# Patient Record
Sex: Female | Born: 2000 | Race: White | Hispanic: Refuse to answer | Marital: Single
Health system: Southern US, Academic
[De-identification: ages and names within clinical notes are randomized; demographics above are authoritative.]

## PROBLEM LIST (undated history)

## (undated) ENCOUNTER — Telehealth
Attending: Student in an Organized Health Care Education/Training Program | Primary: Student in an Organized Health Care Education/Training Program

## (undated) ENCOUNTER — Ambulatory Visit: Payer: PRIVATE HEALTH INSURANCE

## (undated) ENCOUNTER — Inpatient Hospital Stay

## (undated) ENCOUNTER — Encounter

## (undated) ENCOUNTER — Encounter
Attending: Student in an Organized Health Care Education/Training Program | Primary: Student in an Organized Health Care Education/Training Program

## (undated) ENCOUNTER — Ambulatory Visit: Payer: Medicaid (Managed Care) | Attending: Registered" | Primary: Registered"

## (undated) ENCOUNTER — Encounter: Attending: Physician Assistant | Primary: Physician Assistant

## (undated) ENCOUNTER — Ambulatory Visit

## (undated) ENCOUNTER — Telehealth

## (undated) ENCOUNTER — Ambulatory Visit: Payer: MEDICAID

## (undated) ENCOUNTER — Ambulatory Visit
Payer: Medicaid (Managed Care) | Attending: Addiction (Substance Use Disorder) | Primary: Addiction (Substance Use Disorder)

## (undated) ENCOUNTER — Encounter
Payer: Medicaid (Managed Care) | Attending: Addiction (Substance Use Disorder) | Primary: Addiction (Substance Use Disorder)

## (undated) ENCOUNTER — Other Ambulatory Visit

## (undated) ENCOUNTER — Ambulatory Visit: Payer: MEDICAID | Attending: Neurology | Primary: Neurology

## (undated) ENCOUNTER — Ambulatory Visit: Payer: PRIVATE HEALTH INSURANCE | Attending: Physician Assistant | Primary: Physician Assistant

## (undated) ENCOUNTER — Encounter: Attending: Neurology | Primary: Neurology

## (undated) ENCOUNTER — Encounter: Attending: Addiction (Substance Use Disorder) | Primary: Addiction (Substance Use Disorder)

## (undated) ENCOUNTER — Telehealth: Attending: Urology | Primary: Urology

## (undated) ENCOUNTER — Encounter: Attending: Diagnostic Radiology | Primary: Diagnostic Radiology

## (undated) ENCOUNTER — Encounter: Attending: Registered" | Primary: Registered"

## (undated) ENCOUNTER — Ambulatory Visit
Payer: PRIVATE HEALTH INSURANCE | Attending: Addiction (Substance Use Disorder) | Primary: Addiction (Substance Use Disorder)

## (undated) ENCOUNTER — Ambulatory Visit: Attending: Addiction (Substance Use Disorder) | Primary: Addiction (Substance Use Disorder)

## (undated) ENCOUNTER — Ambulatory Visit
Payer: PRIVATE HEALTH INSURANCE | Attending: Student in an Organized Health Care Education/Training Program | Primary: Student in an Organized Health Care Education/Training Program

## (undated) ENCOUNTER — Ambulatory Visit: Payer: Medicaid (Managed Care)

## (undated) ENCOUNTER — Encounter: Payer: Medicaid (Managed Care) | Attending: Registered" | Primary: Registered"

## (undated) ENCOUNTER — Ambulatory Visit: Attending: Pharmacist | Primary: Pharmacist

## (undated) ENCOUNTER — Ambulatory Visit
Payer: MEDICAID | Attending: Rehabilitative and Restorative Service Providers" | Primary: Rehabilitative and Restorative Service Providers"

## (undated) ENCOUNTER — Other Ambulatory Visit: Attending: Addiction (Substance Use Disorder) | Primary: Addiction (Substance Use Disorder)

## (undated) ENCOUNTER — Telehealth: Attending: Neurology | Primary: Neurology

## (undated) ENCOUNTER — Ambulatory Visit
Payer: Medicaid (Managed Care) | Attending: Student in an Organized Health Care Education/Training Program | Primary: Student in an Organized Health Care Education/Training Program

## (undated) ENCOUNTER — Encounter: Attending: Family Medicine | Primary: Family Medicine

## (undated) ENCOUNTER — Ambulatory Visit: Attending: Nurse Practitioner | Primary: Nurse Practitioner

## (undated) ENCOUNTER — Ambulatory Visit: Payer: PRIVATE HEALTH INSURANCE | Attending: Medical | Primary: Medical

---
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
## 2022-01-08 ENCOUNTER — Other Ambulatory Visit: Payer: Self-pay | Admitting: Otolaryngology

## 2022-01-11 ENCOUNTER — Other Ambulatory Visit: Payer: Self-pay | Admitting: Otolaryngology

## 2022-01-11 DIAGNOSIS — H8120 Vestibular neuronitis, unspecified ear: Secondary | ICD-10-CM

## 2022-02-01 ENCOUNTER — Ambulatory Visit
Admission: RE | Admit: 2022-02-01 | Discharge: 2022-02-01 | Disposition: A | Discharge status: Home / Self Care | Payer: BC Managed Care – PPO | Source: Ambulatory Visit | Attending: Otolaryngology | Admitting: Otolaryngology

## 2022-02-01 DIAGNOSIS — H8120 Vestibular neuronitis, unspecified ear: Secondary | ICD-10-CM

## 2022-02-01 MED ORDER — GADOBUTROL 1 MMOL/ML IV SOLN
10.0000 mL | Freq: Once | INTRAVENOUS | Status: AC | PRN
  Administered 2022-02-01: 10 mL via INTRAVENOUS

## 2022-02-17 ENCOUNTER — Other Ambulatory Visit: Payer: Self-pay | Admitting: Nurse Practitioner

## 2022-02-17 DIAGNOSIS — R270 Ataxia, unspecified: Secondary | ICD-10-CM

## 2022-02-17 DIAGNOSIS — R42 Dizziness and giddiness: Secondary | ICD-10-CM

## 2022-02-17 DIAGNOSIS — G379 Demyelinating disease of central nervous system, unspecified: Secondary | ICD-10-CM

## 2022-03-05 ENCOUNTER — Inpatient Hospital Stay: Admission: RE | Admit: 2022-03-05 | Payer: BC Managed Care – PPO | Source: Ambulatory Visit

## 2022-03-16 ENCOUNTER — Other Ambulatory Visit: Payer: BC Managed Care – PPO

## 2022-04-21 ENCOUNTER — Other Ambulatory Visit: Payer: Self-pay | Admitting: Nurse Practitioner

## 2022-04-21 DIAGNOSIS — G35 Multiple sclerosis: Secondary | ICD-10-CM

## 2022-04-30 DIAGNOSIS — G35 Multiple sclerosis: Principal | ICD-10-CM

## 2022-06-09 ENCOUNTER — Telehealth: Admit: 2022-06-09 | Discharge: 2022-06-10 | Payer: PRIVATE HEALTH INSURANCE | Attending: Neurology | Primary: Neurology

## 2022-06-09 DIAGNOSIS — G35 Multiple sclerosis: Principal | ICD-10-CM

## 2022-06-09 DIAGNOSIS — G379 Demyelinating disease of central nervous system, unspecified: Principal | ICD-10-CM

## 2022-06-14 ENCOUNTER — Ambulatory Visit: Admit: 2022-06-14 | Discharge: 2022-06-15 | Payer: PRIVATE HEALTH INSURANCE

## 2022-06-14 DIAGNOSIS — G35 Multiple sclerosis: Principal | ICD-10-CM

## 2022-06-14 DIAGNOSIS — G379 Demyelinating disease of central nervous system, unspecified: Principal | ICD-10-CM

## 2022-07-21 ENCOUNTER — Telehealth
Admit: 2022-07-21 | Discharge: 2022-07-22 | Payer: MEDICAID | Attending: Physician Assistant | Primary: Physician Assistant

## 2022-07-21 MED ORDER — SERTRALINE 50 MG TABLET
ORAL_TABLET | Freq: Every day | ORAL | 2 refills | 30 days | Status: CP
Start: 2022-07-21 — End: 2023-07-21

## 2022-07-31 ENCOUNTER — Emergency Department
Admission: EM | Admit: 2022-07-31 | Discharge: 2022-08-01 | Disposition: A | Discharge status: Home / Self Care | Payer: Medicaid Other | Attending: Emergency Medicine | Admitting: Emergency Medicine

## 2022-07-31 DIAGNOSIS — B372 Candidiasis of skin and nail: Secondary | ICD-10-CM | POA: Insufficient documentation

## 2022-07-31 DIAGNOSIS — R42 Dizziness and giddiness: Secondary | ICD-10-CM | POA: Insufficient documentation

## 2022-07-31 DIAGNOSIS — R55 Syncope and collapse: Secondary | ICD-10-CM | POA: Insufficient documentation

## 2022-07-31 DIAGNOSIS — E876 Hypokalemia: Secondary | ICD-10-CM | POA: Diagnosis not present

## 2022-07-31 DIAGNOSIS — Y900 Blood alcohol level of less than 20 mg/100 ml: Secondary | ICD-10-CM | POA: Diagnosis not present

## 2022-07-31 DIAGNOSIS — N390 Urinary tract infection, site not specified: Secondary | ICD-10-CM | POA: Insufficient documentation

## 2022-07-31 MED ORDER — SODIUM CHLORIDE 0.9 % IV BOLUS
1000.0000 mL | Freq: Once | INTRAVENOUS | Status: AC
  Administered 2022-08-01: 1000 mL via INTRAVENOUS

## 2022-07-31 MED ORDER — DIAZEPAM 5 MG/ML IJ SOLN
2.5000 mg | Freq: Once | INTRAMUSCULAR | Status: DC
  Filled 2022-07-31: qty 2

## 2022-07-31 MED ORDER — ONDANSETRON HCL 4 MG/2ML IJ SOLN
4.0000 mg | Freq: Once | INTRAMUSCULAR | Status: DC
  Filled 2022-07-31: qty 2

## 2022-07-31 NOTE — ED Triage Notes (Signed)
Pt reports near syncope x3 days.  Pt reports dizziness while sitting, standing, moving, x4 years.  EMS reports HR in 170's, ST upon arrival.

## 2022-07-31 NOTE — ED Provider Notes (Incomplete)
[facility] Provider Note    Event Date/Time   First MD Initiated Contact with Patient 07/31/22 2353     (approximate)   History   Near Syncope   HPI  Amber Wilkerson is a 21 y.o. female brought to the ED via EMS from home with a chief complaint of dizziness.  Patient with a history of multiple sclerosis who states she has been dizzy for several months.  Called EMS tonight in reference to near syncope.  Reports sensation of motion while sitting, standing and moving.  Tonight felt like she was going to pass out.  EMS reports sinus tachycardia in the 170s.  Denies headache, vision changes, neck pain, chest pain, shortness of breath, abdominal pain, nausea or vomiting.  Patient feels better with her eyes closed.  Has seen her neurologist multiple times for the same complaint but "she is a bitch".     Past Medical History  Multiple sclerosis   Active Problem List  There are no problems to display for this patient.    Past Surgical History  See chart   Home Medications   Prior to Admission medications   Not on File     Allergies  Blueberry flavor [flavoring agent]   Family History  No family history on file.   Physical Exam  Triage Vital Signs: ED Triage Vitals  Enc Vitals Group     BP      Pulse      Resp      Temp      Temp src      SpO2      Weight      Height      Head Circumference      Peak Flow      Pain Score      Pain Loc      Pain Edu?      Excl. in [place]?     Updated Vital Signs: BP (!) 150/91   Pulse (!) 124   Temp 98.9 F (37.2 C) (Oral)   Resp 18   Ht 5\' 3"  (1.6 m)   Wt 129.3 kg   SpO2 96%   BMI 50.49 kg/m    General: Awake, mild distress.  CV:  Tachycardic.  Good peripheral perfusion.  Resp:  Normal effort.  CTA B. Abd:  Nontender.  No distention.  Other:  No thyromegaly.   ED Results / Procedures / Treatments  Labs (all labs ordered are listed, but only abnormal results are displayed) Labs  Reviewed  CBC WITH DIFFERENTIAL/PLATELET  COMPREHENSIVE METABOLIC PANEL  URINALYSIS, ROUTINE W REFLEX MICROSCOPIC  URINE DRUG SCREEN, QUALITATIVE ([facility] ONLY)  ETHANOL  POC URINE PREG, ED  TROPONIN I (HIGH SENSITIVITY)     EKG  ***   RADIOLOGY *** {You MUST document your own interpretation of imaging, as well as the fact that you reviewed the radiologist's report!:1}  Official radiology report(s): No results found.   PROCEDURES:  Critical Care performed: {CriticalCareYesNo:19197::"Yes, see critical care procedure note(s)","No"}  Procedures   MEDICATIONS ORDERED IN ED: Medications  sodium chloride 0.9 % bolus 1,000 mL (has no administration in time range)  ondansetron (ZOFRAN) injection 4 mg (has no administration in time range)  diazepam (VALIUM) injection 2.5 mg (has no administration in time range)     IMPRESSION / MDM / ASSESSMENT AND PLAN / ED COURSE  I reviewed the triage vital signs and the nursing notes.  Differential diagnosis includes, but is not limited to, ***  Patient's presentation is most consistent with {EM COPA:27473}  {If the patient is on the monitor, remove the brackets and asterisks on the sentence below and remember to document it as a Procedure as well. Otherwise delete the sentence below:1} {**The patient is on the cardiac monitor to evaluate for evidence of arrhythmia and/or significant heart rate changes.**}  {Remember to include, when applicable, any/all of the following data: independent review of imaging independent review of labs (comment specifically on pertinent positives and negatives) review of specific prior hospitalizations, PCP/specialist notes, etc. discuss meds given and prescribed document any discussion with consultants (including hospitalists) any clinical decision tools you used and why (PECARN, NEXUS, etc.) did you consider admitting the patient? document social determinants of health  affecting patient's care (homelessness, inability to follow up in a timely fashion, etc) document any pre-existing conditions increasing risk on current visit (e.g. diabetes and HTN increasing danger of high-risk chest pain/ACS) describes what meds you gave (especially parenteral) and why any other interventions?:1}      FINAL CLINICAL IMPRESSION(S) / ED DIAGNOSES   Final diagnoses:  Near syncope  Dizziness     Rx / DC Orders   ED Discharge Orders     None        Note:  This document was prepared using Dragon voice recognition software and may include unintentional dictation errors.

## 2022-08-01 ENCOUNTER — Emergency Department: Payer: Medicaid Other

## 2022-08-01 LAB — URINALYSIS, ROUTINE W REFLEX MICROSCOPIC
Bilirubin Urine: NEGATIVE
Glucose, UA: NEGATIVE mg/dL
Ketones, ur: NEGATIVE mg/dL
Nitrite: NEGATIVE
Protein, ur: 30 mg/dL — AB
Specific Gravity, Urine: 1.021 (ref 1.005–1.030)
pH: 7 (ref 5.0–8.0)

## 2022-08-01 LAB — CBC WITH DIFFERENTIAL/PLATELET
Abs Immature Granulocytes: 0.02 10*3/uL (ref 0.00–0.07)
Basophils Absolute: 0 10*3/uL (ref 0.0–0.1)
Basophils Relative: 0 %
Eosinophils Absolute: 0.1 10*3/uL (ref 0.0–0.5)
Eosinophils Relative: 1 %
HCT: 46.1 % — ABNORMAL HIGH (ref 36.0–46.0)
Hemoglobin: 15.6 g/dL — ABNORMAL HIGH (ref 12.0–15.0)
Immature Granulocytes: 0 %
Lymphocytes Relative: 22 %
Lymphs Abs: 2 10*3/uL (ref 0.7–4.0)
MCH: 28.9 pg (ref 26.0–34.0)
MCHC: 33.8 g/dL (ref 30.0–36.0)
MCV: 85.4 fL (ref 80.0–100.0)
Monocytes Absolute: 1 10*3/uL (ref 0.1–1.0)
Monocytes Relative: 11 %
Neutro Abs: 5.9 10*3/uL (ref 1.7–7.7)
Neutrophils Relative %: 66 %
Platelets: 234 10*3/uL (ref 150–400)
RBC: 5.4 MIL/uL — ABNORMAL HIGH (ref 3.87–5.11)
RDW: 12 % (ref 11.5–15.5)
WBC: 9 10*3/uL (ref 4.0–10.5)
nRBC: 0 % (ref 0.0–0.2)

## 2022-08-01 LAB — URINE DRUG SCREEN, QUALITATIVE (ARMC ONLY)
Amphetamines, Ur Screen: NOT DETECTED
Barbiturates, Ur Screen: NOT DETECTED
Benzodiazepine, Ur Scrn: NOT DETECTED
Cannabinoid 50 Ng, Ur ~~LOC~~: NOT DETECTED
Cocaine Metabolite,Ur ~~LOC~~: NOT DETECTED
MDMA (Ecstasy)Ur Screen: NOT DETECTED
Methadone Scn, Ur: NOT DETECTED
Opiate, Ur Screen: NOT DETECTED
Phencyclidine (PCP) Ur S: NOT DETECTED
Tricyclic, Ur Screen: NOT DETECTED

## 2022-08-01 LAB — COMPREHENSIVE METABOLIC PANEL
ALT: 70 U/L — ABNORMAL HIGH (ref 0–44)
AST: 42 U/L — ABNORMAL HIGH (ref 15–41)
Albumin: 3.8 g/dL (ref 3.5–5.0)
Alkaline Phosphatase: 55 U/L (ref 38–126)
Anion gap: 8 (ref 5–15)
BUN: 8 mg/dL (ref 6–20)
CO2: 21 mmol/L — ABNORMAL LOW (ref 22–32)
Calcium: 8.8 mg/dL — ABNORMAL LOW (ref 8.9–10.3)
Chloride: 112 mmol/L — ABNORMAL HIGH (ref 98–111)
Creatinine, Ser: 0.72 mg/dL (ref 0.44–1.00)
GFR, Estimated: >60 mL/min (ref >60)
Glucose, Bld: 98 mg/dL (ref 70–99)
Potassium: 3.3 mmol/L — ABNORMAL LOW (ref 3.5–5.1)
Sodium: 141 mmol/L (ref 135–145)
Total Bilirubin: 0.5 mg/dL (ref 0.3–1.2)
Total Protein: 7.5 g/dL (ref 6.5–8.1)

## 2022-08-01 LAB — D-DIMER, QUANTITATIVE: D-Dimer, Quant: 0.36 ug/mL-FEU (ref 0.00–0.50)

## 2022-08-01 LAB — TSH: TSH: 2.252 u[IU]/mL (ref 0.350–4.500)

## 2022-08-01 LAB — POC URINE PREG, ED: Preg Test, Ur: NEGATIVE

## 2022-08-01 LAB — TROPONIN I (HIGH SENSITIVITY)
Troponin I (High Sensitivity): 12 ng/L (ref <18)
Troponin I (High Sensitivity): 3 ng/L (ref <18)

## 2022-08-01 LAB — MAGNESIUM: Magnesium: 1.9 mg/dL (ref 1.7–2.4)

## 2022-08-01 LAB — T4, FREE: Free T4: 0.92 ng/dL (ref 0.61–1.12)

## 2022-08-01 LAB — ETHANOL: Alcohol, Ethyl (B): <10 mg/dL (ref <10)

## 2022-08-01 MED ORDER — NYSTATIN 100000 UNIT/GM EX POWD: 1.0000 | Freq: Three times a day (TID) | CUTANEOUS | 2 refills | Status: AC

## 2022-08-01 MED ORDER — POTASSIUM CHLORIDE CRYS ER 20 MEQ PO TBCR
40.0000 meq | EXTENDED_RELEASE_TABLET | Freq: Once | ORAL | Status: DC
  Filled 2022-08-01: qty 2

## 2022-08-01 MED ORDER — NYSTATIN 100000 UNIT/GM EX POWD
Freq: Once | CUTANEOUS | Status: AC
  Filled 2022-08-01: qty 15

## 2022-08-01 MED ORDER — CEPHALEXIN 500 MG PO CAPS: 500.0000 mg | ORAL_CAPSULE | Freq: Three times a day (TID) | ORAL | 0 refills | Status: AC

## 2022-08-01 MED ORDER — BARIATRIC ROLLATOR MISC: 0 refills | Status: AC

## 2022-08-01 MED ORDER — POTASSIUM CHLORIDE 20 MEQ PO PACK
40.0000 meq | PACK | Freq: Once | ORAL | Status: AC
  Administered 2022-08-01: 40 meq via ORAL
  Filled 2022-08-01: qty 2

## 2022-08-01 MED ORDER — SODIUM CHLORIDE 0.9 % IV SOLN
1.0000 g | Freq: Once | INTRAVENOUS | Status: AC
  Administered 2022-08-01: 1 g via INTRAVENOUS
  Filled 2022-08-01: qty 10

## 2022-08-01 NOTE — Discharge Instructions (Addendum)
1.  Take antibiotic as prescribed (Keflex 500mg  3 times daily x7 days). 2.  Drink plenty of fluids daily. 3.  Apply antifungal powder to both armpits and beneath right breast 3 times daily as needed. 4.  Return to the ER for worsening symptoms, persistent vomiting, difficulty breathing or other concerns.

## 2022-08-01 NOTE — ED Provider Notes (Signed)
[facility] Provider Note    Event Date/Time   First MD Initiated Contact with Patient 07/31/22 2353     (approximate)   History   Near Syncope   HPI  Amber Wilkerson is a 21 y.o. female brought to the ED via EMS from home with a chief complaint of dizziness.  Patient with a history of multiple sclerosis who states she has been dizzy for several months.  Called EMS tonight in reference to near syncope.  Reports sensation of motion while sitting, standing and moving.  Tonight felt like she was going to pass out.  EMS reports sinus tachycardia in the 170s.  Denies headache, vision changes, neck pain, chest pain, shortness of breath, abdominal pain, nausea or vomiting.  Patient feels better with her eyes closed.  Has seen her neurologist multiple times for the same complaint but "she is a bitch".     Past Medical History  Multiple sclerosis   Active Problem List  There are no problems to display for this patient.    Past Surgical History  See chart   Home Medications   Prior to Admission medications   Medication Sig Start Date End Date Taking? Authorizing Provider  cephALEXin (KEFLEX) 500 MG capsule Take 1 capsule (500 mg total) by mouth 3 (three) times daily. 08/01/22  Yes Paulette Blanch, MD  Misc. Devices (BARIATRIC ROLLATOR) MISC Use to ambulate 08/01/22  Yes Paulette Blanch, MD  nystatin (MYCOSTATIN/NYSTOP) powder Apply 1 Application topically 3 (three) times daily. 08/01/22  Yes Paulette Blanch, MD     Allergies  Blueberry flavor [flavoring agent]   Family History  No family history on file.   Physical Exam  Triage Vital Signs: ED Triage Vitals  Enc Vitals Group     BP      Pulse      Resp      Temp      Temp src      SpO2      Weight      Height      Head Circumference      Peak Flow      Pain Score      Pain Loc      Pain Edu?      Excl. in [place]?     Updated Vital Signs: BP 120/74   Pulse (!) 109   Temp 98.9 F (37.2 C) (Oral)    Resp (!) 21   Ht 5\' 3"  (1.6 m)   Wt 129.3 kg   SpO2 98%   BMI 50.49 kg/m    General: Awake, mild distress.  CV:  Tachycardic.  Good peripheral perfusion.  Resp:  Normal effort.  CTA B. Abd:  Nontender.  No distention.  Other:  No carotid bruits.  Supple neck without meningismus.  No thyromegaly.  Keeps eyes closed for comfort.  Alert and oriented x3.  CN II-XII Slee intact.  5/5 motor strength and sensation all extremities.   ED Results / Procedures / Treatments  Labs (all labs ordered are listed, but only abnormal results are displayed) Labs Reviewed  CBC WITH DIFFERENTIAL/PLATELET - Abnormal; Notable for the following components:      Result Value   RBC 5.40 (*)    Hemoglobin 15.6 (*)    HCT 46.1 (*)    All other components within normal limits  COMPREHENSIVE METABOLIC PANEL - Abnormal; Notable for the following components:   Potassium 3.3 (*)    Chloride 112 (*)  CO2 21 (*)    Calcium 8.8 (*)    AST 42 (*)    ALT 70 (*)    All other components within normal limits  URINALYSIS, ROUTINE W REFLEX MICROSCOPIC - Abnormal; Notable for the following components:   Color, Urine YELLOW (*)    APPearance HAZY (*)    Hgb urine dipstick SMALL (*)    Protein, ur 30 (*)    Leukocytes,Ua SMALL (*)    Bacteria, UA RARE (*)    All other components within normal limits  URINE DRUG SCREEN, QUALITATIVE ([facility] ONLY)  ETHANOL  D-DIMER, QUANTITATIVE  TSH  T4, FREE  MAGNESIUM  POC URINE PREG, ED  TROPONIN I (HIGH SENSITIVITY)  TROPONIN I (HIGH SENSITIVITY)     EKG  ED ECG REPORT I, Haydin Calandra J, the attending physician, personally viewed and interpreted this ECG.   Date: 08/01/2022  EKG Time: 2357  Rate: 126  Rhythm: sinus tachycardia  Axis: Normal  Intervals:none  ST&T Change: Nonspecific    RADIOLOGY I have independently visualized and interpreted patient's CT Head as well as noted the radiology interpretation:  CT head: No acute abnormality; chronic  changes  Official radiology report(s): CT Head Wo Contrast  Result Date: 08/01/2022 CLINICAL DATA:  Dizziness, with cardiac versus vascular cause suspected. EXAM: CT HEAD WITHOUT CONTRAST TECHNIQUE: Contiguous axial images were obtained from the base of the skull through the vertex without intravenous contrast. RADIATION DOSE REDUCTION: This exam was performed according to the departmental dose-optimization program which includes automated exposure control, adjustment of the mA and/or kV according to patient size and/or use of iterative reconstruction technique. COMPARISON:  MRI brain without and with contrast 02/01/2022, with additional IAC protocol images. FINDINGS: Brain: There is slight cortical atrophy both cerebral hemispheres, unusual for a 21 year old. No acute cortical based infarct, hemorrhage, mass effect or midline shift are seen. Ventricles are normal in size and position. 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 The MRI demonstrated multiple bilateral periventricular and juxtacortical T2 hyperdense foci concern for demyelinating disease, none of which showed enhancement. These are very subtly redemonstrated as small areas of white matter hypodensity. The cerebellum and brainstem are unremarkable, as visualized. Vascular: No hyperdense central vessel. Skull: No fractures or focal lesions. Sinuses/Orbits: Increasing patchy opacification of the bilateral ethmoid air cells. Trace fluid is newly noted in the right maxillary sinus. Other bilateral sinuses, bilateral mastoid air cells and middle ears are clear. The nasal septum deviates to the right. Unremarkable orbital contents. Other: None. IMPRESSION: 1. No acute intracranial CT findings. 2. Slight cortical atrophy, unusual for a 21 year old. 3. 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 4. The MRI showed  multiple bilateral periventricular and juxtacortical T2 hyperdense foci, none of which showed enhancement. These are very subtly redemonstrated as small areas of white matter hypodensity. Overall concern for demyelinating disease. 5. Increasing patchy opacification of the bilateral ethmoid air cells and new trace fluid in the right maxillary sinus. Electronically Signed   By: Telford Nab M.D.   On: 08/01/2022 00:54     PROCEDURES:  Critical Care performed: No  .1-3 Lead EKG Interpretation  Performed by: Paulette Blanch, MD Authorized by: Paulette Blanch, MD     Interpretation: abnormal     ECG rate:  120   ECG rate assessment: tachycardic     Rhythm: sinus tachycardia     Ectopy: none  Conduction: normal   Comments:     Patient placed on cardiac monitor to evaluate for arrhythmias    MEDICATIONS ORDERED IN ED: Medications  ondansetron (ZOFRAN) injection 4 mg (4 mg Intravenous Not Given 08/01/22 0051)  diazepam (VALIUM) injection 2.5 mg (10 mg Intravenous Not Given 08/01/22 0051)  potassium chloride SA (KLOR-CON M) CR tablet 40 mEq (40 mEq Oral Not Given 08/01/22 0112)  nystatin (MYCOSTATIN/NYSTOP) topical powder (has no administration in time range)  sodium chloride 0.9 % bolus 1,000 mL (0 mLs Intravenous Stopped 08/01/22 0215)  cefTRIAXone (ROCEPHIN) 1 g in sodium chloride 0.9 % 100 mL IVPB (0 g Intravenous Stopped 08/01/22 0215)  potassium chloride (KLOR-CON) packet 40 mEq (40 mEq Oral Given 08/01/22 0130)     IMPRESSION / MDM / ASSESSMENT AND PLAN / ED COURSE  I reviewed the triage vital signs and the nursing notes.                             21 year old female with multiple sclerosis presenting with dizziness and near-syncope. Differential diagnosis includes but is not limited to [place], CVA, ACS, metabolic, infectious etiologies, etc. I have personally reviewed patient's records and note a neurology telemedicine visit on 07/21/2022 for MS flare.  Patient's presentation is most  consistent with acute presentation with potential threat to life or bodily function.  The patient is on the cardiac monitor to evaluate for evidence of arrhythmia and/or significant heart rate changes.  We will obtain cardiac panel, CT head.  Initiate IV fluid hydration, IV Valium for dizziness and reassess.  Clinical Course as of 08/01/22 [id number]  Nancy Fetter Aug 01, 2022  0102 Patient refused Valium and Zofran, stating she is not anxious nor nauseous.  I explained that the Valium is for dizziness.  Patient states she is not here for dizziness because she has been dizzy for 4 years, she is here for feeling lightheaded.  Laboratory results demonstrate normal WBC of 9, mild hypokalemia with potassium 3.3, initial troponin is negative.  Negative UDS and D-dimer.  Small leukocyte positive UTI; will administer IV Rocephin.  CT head demonstrates no acute abnormality, shows signs of chronic changes. [JS]  0220 Delay secondary to computer downtime.  Patient overall feeling better.  Heart rate improving.  Awaiting results of repeat troponin. [JS]  [id number] Repeat troponin remains unremarkable.  Patient is overall feeling better and eager for discharge home.  Will discharge home with prescription for Keflex.  She is requesting referral to neurology as she is dissatisfied with her current neurologist at [facility].  Offered patient a walker but she would rather take a prescription; bariatric rollator prescription provided.  Patient also complains of burning underneath her armpits: Candidal yeast infection beneath both axilla and right breast.  We will apply and prescribe nystatin powder.  Strict return precautions given.  Patient and family member verbalized understanding agree with plan of care. [JS]    Clinical Course User Index [JS] Paulette Blanch, MD     FINAL CLINICAL IMPRESSION(S) / ED DIAGNOSES   Final diagnoses:  Near syncope  Dizziness  Lower urinary tract infectious disease  Hypokalemia  Candidiasis, intertrigo      Rx / DC Orders   ED Discharge Orders          Ordered    cephALEXin (KEFLEX) 500 MG capsule  3 times daily        08/01/22 0258    nystatin (MYCOSTATIN/NYSTOP) powder  3 times  daily        08/01/22 0304    Misc. Devices (BARIATRIC ROLLATOR) MISC        08/01/22 [id number]             Note:  This document was prepared using Dragon voice recognition software and may include unintentional dictation errors.   Paulette Blanch, MD 08/01/22 [id number]

## 2022-08-08 MED ORDER — SERTRALINE 50 MG TABLET
ORAL_TABLET | Freq: Every day | ORAL | 2 refills | 30 days | Status: CP
Start: 2022-08-08 — End: 2023-08-08

## 2022-10-13 ENCOUNTER — Encounter: Payer: Self-pay | Admitting: Nurse Practitioner

## 2022-10-15 ENCOUNTER — Other Ambulatory Visit: Payer: Self-pay | Admitting: Neurology

## 2022-10-15 DIAGNOSIS — G35 Multiple sclerosis: Secondary | ICD-10-CM

## 2022-11-23 ENCOUNTER — Ambulatory Visit: Payer: Medicaid Other

## 2022-11-24 ENCOUNTER — Other Ambulatory Visit: Payer: Self-pay | Admitting: Neurology

## 2022-11-24 DIAGNOSIS — G35 Multiple sclerosis: Secondary | ICD-10-CM

## 2022-12-06 ENCOUNTER — Ambulatory Visit: Payer: Medicaid Other

## 2022-12-30 ENCOUNTER — Ambulatory Visit
Admit: 2022-12-30 | Discharge: 2023-01-05 | Disposition: A | Discharge status: Rehabilitation Facility | Payer: PRIVATE HEALTH INSURANCE | Admitting: Student in an Organized Health Care Education/Training Program

## 2022-12-30 ENCOUNTER — Ambulatory Visit: Admit: 2022-12-30 | Discharge: 2023-01-05 | Payer: PRIVATE HEALTH INSURANCE

## 2023-01-05 ENCOUNTER — Ambulatory Visit
Admission: TF | Admit: 2023-01-05 | Discharge: 2023-01-14 | Disposition: A | Discharge status: Home Health | Payer: MEDICAID | Source: Intra-hospital

## 2023-01-05 ENCOUNTER — Ambulatory Visit
Admission: TF | Admit: 2023-01-05 | Discharge: 2023-01-14 | Disposition: A | Discharge status: Home Health | Payer: PRIVATE HEALTH INSURANCE | Source: Intra-hospital

## 2023-01-13 MED ORDER — ERGOCALCIFEROL (VITAMIN D2) 1,250 MCG (50,000 UNIT) CAPSULE
ORAL_CAPSULE | ORAL | 0 refills | 28 days | Status: CP
Start: 2023-01-13 — End: ?
  Filled 2023-01-14: qty 4, 28d supply, fill #0

## 2023-01-13 MED ORDER — CARBOXYMETHYLCELLULOSE SODIUM 1 % EYE GEL IN A DROPPERETTE
Freq: Four times a day (QID) | OPHTHALMIC | 0 refills | 5 days | Status: CP
Start: 2023-01-13 — End: ?

## 2023-01-13 MED ORDER — NYSTATIN 100,000 UNIT/GRAM TOPICAL POWDER
Freq: Two times a day (BID) | TOPICAL | 0 refills | 15 days | Status: CP
Start: 2023-01-13 — End: ?
  Filled 2023-01-14: qty 15, 7d supply, fill #0

## 2023-01-13 MED ORDER — CHOLECALCIFEROL (VITAMIN D3) 25 MCG (1,000 UNIT) TABLET
ORAL_TABLET | Freq: Every day | ORAL | 0 refills | 30 days | Status: CP
Start: 2023-01-13 — End: ?
  Filled 2023-01-14: qty 30, 30d supply, fill #0

## 2023-01-14 MED ORDER — CETIRIZINE 10 MG TABLET
ORAL_TABLET | Freq: Every day | ORAL | 0 refills | 30 days | Status: CP | PRN
Start: 2023-01-14 — End: ?
  Filled 2023-01-14: qty 30, 30d supply, fill #0

## 2023-01-15 ENCOUNTER — Encounter: Admit: 2023-01-15 | Payer: PRIVATE HEALTH INSURANCE

## 2023-01-15 ENCOUNTER — Inpatient Hospital Stay: Admit: 2023-01-15 | Payer: MEDICAID

## 2023-01-15 ENCOUNTER — Encounter: Admit: 2023-01-15 | Discharge: 2023-02-13 | Payer: PRIVATE HEALTH INSURANCE

## 2023-01-25 ENCOUNTER — Ambulatory Visit: Admit: 2023-01-25 | Discharge: 2023-01-26 | Payer: PRIVATE HEALTH INSURANCE

## 2023-01-25 DIAGNOSIS — R5383 Other fatigue: Principal | ICD-10-CM

## 2023-01-25 DIAGNOSIS — E161 Other hypoglycemia: Principal | ICD-10-CM

## 2023-01-25 DIAGNOSIS — K582 Mixed irritable bowel syndrome: Principal | ICD-10-CM

## 2023-01-25 DIAGNOSIS — B354 Tinea corporis: Principal | ICD-10-CM

## 2023-01-25 DIAGNOSIS — Z6841 Body Mass Index (BMI) 40.0 and over, adult: Principal | ICD-10-CM

## 2023-01-25 DIAGNOSIS — R197 Diarrhea, unspecified: Principal | ICD-10-CM

## 2023-01-25 DIAGNOSIS — R3915 Urgency of urination: Principal | ICD-10-CM

## 2023-01-25 MED ORDER — DICYCLOMINE 10 MG CAPSULE
ORAL_CAPSULE | Freq: Four times a day (QID) | ORAL | 3 refills | 30 days | Status: CP
Start: 2023-01-25 — End: 2023-05-25

## 2023-01-25 MED ORDER — LOPERAMIDE 2 MG TABLET
ORAL_TABLET | Freq: Four times a day (QID) | ORAL | 0 refills | 8 days | Status: CP | PRN
Start: 2023-01-25 — End: ?

## 2023-01-25 MED ORDER — TAMSULOSIN 0.4 MG CAPSULE
ORAL_CAPSULE | Freq: Every day | ORAL | 3 refills | 90 days | Status: CP
Start: 2023-01-25 — End: 2024-01-25

## 2023-01-25 MED ORDER — NYSTATIN 100,000 UNIT/GRAM TOPICAL POWDER
Freq: Two times a day (BID) | TOPICAL | 1 refills | 60 days | Status: CP
Start: 2023-01-25 — End: 2023-05-25

## 2023-02-11 ENCOUNTER — Emergency Department
Admit: 2023-02-11 | Discharge: 2023-02-12 | Disposition: A | Discharge status: Home / Self Care | Payer: PRIVATE HEALTH INSURANCE | Attending: Emergency Medicine

## 2023-02-11 ENCOUNTER — Ambulatory Visit
Admit: 2023-02-11 | Discharge: 2023-02-12 | Disposition: A | Discharge status: Home / Self Care | Payer: PRIVATE HEALTH INSURANCE | Attending: Emergency Medicine

## 2023-02-12 MED ORDER — CEPHALEXIN 500 MG CAPSULE
ORAL_CAPSULE | Freq: Four times a day (QID) | ORAL | 0 refills | 10 days | Status: CP
Start: 2023-02-12 — End: 2023-02-22

## 2023-02-14 ENCOUNTER — Encounter: Admit: 2023-02-14 | Payer: PRIVATE HEALTH INSURANCE

## 2023-02-16 ENCOUNTER — Ambulatory Visit
Admit: 2023-02-16 | Discharge: 2023-02-16 | Disposition: A | Discharge status: Home / Self Care | Payer: PRIVATE HEALTH INSURANCE

## 2023-02-16 DIAGNOSIS — R200 Anesthesia of skin: Principal | ICD-10-CM

## 2023-02-16 DIAGNOSIS — R531 Weakness: Principal | ICD-10-CM

## 2023-02-16 DIAGNOSIS — R202 Paresthesia of skin: Principal | ICD-10-CM

## 2023-02-23 MED ORDER — NYSTATIN 100,000 UNIT/GRAM TOPICAL POWDER
Freq: Two times a day (BID) | TOPICAL | 1 refills | 60 days | Status: CP
Start: 2023-02-23 — End: 2023-06-23

## 2023-03-02 DIAGNOSIS — R110 Nausea: Principal | ICD-10-CM

## 2023-03-02 DIAGNOSIS — M79605 Pain in left leg: Principal | ICD-10-CM

## 2023-03-02 DIAGNOSIS — M79604 Pain in right leg: Principal | ICD-10-CM

## 2023-03-02 MED ORDER — KETOROLAC 10 MG TABLET
ORAL_TABLET | Freq: Four times a day (QID) | ORAL | 0 refills | 7 days | Status: CP | PRN
Start: 2023-03-02 — End: 2023-03-09

## 2023-03-02 MED ORDER — BACLOFEN 10 MG TABLET
ORAL_TABLET | Freq: Every day | ORAL | 1 refills | 30 days | Status: CP
Start: 2023-03-02 — End: 2024-03-01

## 2023-03-02 MED ORDER — ONDANSETRON 8 MG DISINTEGRATING TABLET
ORAL_TABLET | Freq: Three times a day (TID) | 0 refills | 30 days | Status: CP | PRN
Start: 2023-03-02 — End: 2023-04-01

## 2023-03-07 ENCOUNTER — Ambulatory Visit
Admit: 2023-03-07 | Discharge: 2023-03-08 | Payer: PRIVATE HEALTH INSURANCE | Attending: Physician Assistant | Primary: Physician Assistant

## 2023-03-07 MED ORDER — TERIFLUNOMIDE 14 MG TABLET
ORAL_TABLET | Freq: Every day | ORAL | 5 refills | 30 days | Status: CP
Start: 2023-03-07 — End: ?
  Filled 2023-03-10: qty 30, 30d supply, fill #0

## 2023-03-08 NOTE — Unmapped (Signed)
[facility] Specialty Medication Onboarding    Specialty Medication: teriflunomide  Prior Authorization: Not Required   Financial Assistance: No - copay  <$25  Final Copay/Day Supply: $0 / 30    Insurance Restrictions: None     Notes to Pharmacist: None  Credit Card on File: not applicable    The triage team has completed the benefits investigation and has determined that the patient is able to fill this medication at [facility]. Please contact the patient to complete the onboarding or follow up with the prescribing physician as needed.

## 2023-03-09 NOTE — Unmapped (Signed)
[facility]   Patient Onboarding/Medication Counseling    Brandi Flynn is a 22 y.o. female bing evaluated for multiple sclerosis and sarcoidosis who I am counseling today on initiation of therapy.  I am speaking to the patient.    Was a Nurse, learning disability used for this call? No    Verified patient's date of birth / HIPAA.    Specialty medication(s) to be sent: Neurology: Aubagio      Non-specialty medications/supplies to be sent: none      Medications not needed at this time: N/A         Aubagio (teriflunomide)    Medication & Administration     Dosage: Multiple sclerosis: Take 1 tablet (14mg ) by mouth once daily.    Administration: Take Aubagio by mouth once daily without regard to meals    Adherence/Missed dose instructions: Take a missed dose as soon as you remember. If it is close to the time of your next dose, skip the missed dose and resume your normal schedule. Do not take 2 doses at once.    Lab tests required prior to treatment initiation:  Tuberculosis: Tuberculosis screening resulted in a non-reactive Quantiferon TB Gold assay.  For female patients - is there a documented pregnancy test? Pregnancy test yielded negative result.  CBC: CBC complete and WNL.    Goals of Therapy     Reduce the frequency of flare-ups   Slow disease progression    Side Effects & Monitoring Parameters     Common side effects:  Upset stomach/nausea/diarrhea  Headache  Hair thinning/loss  Joint pain    The following side effects should be reported to the provider:  Signs of an allergic reaction  Signs of liver problems (yellowing of the skin/eyes, light colored stool, dark urine, severe stomach pain/vomiting)  Signs of high blood pressure (severe headache/dizziness, passing out, changes in vision)  Swollen glands  Burning, numbness or tingling that is not normal  Signs of infection  Unexplained bruising or bleeding  Severe fatigue  Signs of Stevens-Johnson syndrome (redness, swelling, blistering or feeling of the skin; red or irritated eyes; sores in the mouth, throat, nose or eyes)  Shortness of breath, difficulty breathing or new or worsening cough    Warnings & Precautions     Contraindications:  Pregnancy  Coadministrating with leflunomide  Warnings:  Hepatotoxicity (LFTs within 6 months prior to initiation, monthly for the first 6 months of therapy then every 6 months or as clinically indicated)  Reproductive concerns (both women AND men must use contraception while taking Aubagio)  Hypertension (blood pressure should be monitored prior to initiation and periodically thereafter)    Drug/Food Interactions     Medication list reviewed in Epic. The patient was instructed to inform the care team before taking any new medications or supplements.  Drug-drug interaction identified: ketorolac and teriflunomide - teriflunomide may increase the serum concentration of ketorolac (OAT1/3 substrate) .   Immunization catch-up should be completed prior to initiating Aubagio. Live vaccines should be avoided during therapy.     Storage, Handling Precautions, & Disposal     Store at room temperature in the original labelled package.  [name] is considered a hazardous agent, it is recommended that anyone other than the patient should wear gloves if handling.     Current Medications (including OTC/herbals), Comorbidities and Allergies     Current Outpatient Medications   Medication Sig Dispense Refill    baclofen (LIORESAL) 10 MG tablet Take 1 tablet (10 mg total)  by mouth daily. (Patient not taking: Reported on 03/07/2023) 30 tablet 1    carboxymethylcellulose sodium (REFRESH CELLUVISC) 1 % DpGe Administer 1 drop to both eyes four (4) times a day. 1 each 0    cetirizine (ZYRTEC) 10 MG tablet Take 1 tablet (10 mg total) by mouth daily as needed for allergies. 30 tablet 0    dicyclomine (BENTYL) 10 mg capsule Take 1 capsule (10 mg total) by mouth four (4) times a day. 120 capsule 3    ergocalciferol-1,250 mcg, 50,000 unit, (DRISDOL) 1,250 mcg (50,000 unit) capsule Take 1 capsule (1,250 mcg total) by mouth once a week. Complete this treatment first before resuming to home dose of tablets. 4 capsule 0    ketorolac (TORADOL) 10 mg tablet Take 1 tablet (10 mg total) by mouth every six (6) hours as needed for pain for up to 7 days. 28 tablet 0    loperamide (IMODIUM A-D) 2 mg tablet Take 1 tablet (2 mg total) by mouth four (4) times a day as needed for diarrhea. 30 tablet 0    nystatin (MYCOSTATIN) 100,000 unit/gram powder Apply 1 Application topically two (2) times a day. 60 g 1    ondansetron (ZOFRAN-ODT) 8 MG disintegrating tablet Dissolve 1 tablet (8 mg total) in the mouth every eight (8) hours as needed for nausea. 90 tablet 0    tamsulosin (FLOMAX) 0.4 mg capsule Take 1 capsule (0.4 mg total) by mouth daily. 90 capsule 3    teriflunomide 14 mg Tab Take 1 tablet (14 mg total) by mouth in the morning. 30 tablet 5     No current facility-administered medications for this visit.       Allergies   Allergen Reactions    Blueberry Hives    Peanut Swelling     Swelling tongue       Patient Active Problem List   Diagnosis    Tobacco use disorder    Multiple sclerosis (CMS-HCC)    Demyelinating disease (CMS-HCC)    Blepharitis of both eyes    Dry eye syndrome of both eyes    Obesity       Reviewed and up to date in Epic.    Appropriateness of Therapy     Acute infections noted within Epic:  No active infections  Patient reported infection:  N/A    Is medication and dose appropriate based on diagnosis and infection status? Yes    Prescription has been clinically reviewed: Yes      Baseline Quality of Life Assessment       N/A    Financial Information     Medication Assistance provided: None Required    Anticipated copay of $0 reviewed with patient. Verified delivery address.    Delivery Information     Scheduled delivery date: 03/11/2023    Expected start date: 03/11/2023    Medication will be delivered via UPS to the prescription address in [facility].  This shipment will not require a signature.      Explained the services we provide at [facility] and that each month we would call to set up refills.  Stressed importance of returning phone calls so that we could ensure they receive their medications in time each month.  Informed patient that we should be setting up refills 7-10 days prior to when they will run out of medication.  A pharmacist will reach out to perform a clinical assessment periodically.  Informed patient that a welcome packet,  containing information about our pharmacy and other support services, a Notice of Privacy Practices, and a drug information handout will be sent.      The patient or caregiver noted above participated in the development of this care plan and knows that they can request review of or adjustments to the care plan at any time.      Patient or caregiver verbalized understanding of the above information as well as how to contact the pharmacy at [phone] option 4 with any questions/concerns.  The pharmacy is open Monday through Friday 8:30am-4:30pm.  A pharmacist is available 24/7 via pager to answer any clinical questions they may have.    Patient Specific Needs     Does the patient have any physical, cognitive, or cultural barriers? No    Does the patient have adequate living arrangements? (i.e. the ability to store and take their medication appropriately) Yes    Did you identify any home environmental safety or security hazards? No    Patient prefers to have medications discussed with  Patient     Is the patient or caregiver able to read and understand education materials at a high school level or above? Yes    Patient's primary language is  English     Is the patient high risk? No    SOCIAL DETERMINANTS OF HEALTH     At the [facility], we have learned that life circumstances - like trouble affording food, housing, utilities, or transportation can affect the health of many of our patients.   That is why we wanted to ask: are you currently experiencing any life circumstances that are negatively impacting your health and/or quality of life? Patient declined to answer    Social Determinants of Health     Financial Resource Strain: Low Risk  (01/06/2023)    Overall Financial Resource Strain (CARDIA)     Difficulty of Paying Living Expenses: Not hard at all   Internet Connectivity: Not on file   Food Insecurity: No Food Insecurity (01/06/2023)    Hunger Vital Sign     Worried About Running Out of Food in the Last Year: Never true     Ran Out of Food in the Last Year: Never true   Tobacco Use: High Risk (03/07/2023)    Patient History     Smoking Tobacco Use: Every Day     Smokeless Tobacco Use: Never     Passive Exposure: Not on file   Housing/Utilities: Low Risk  (01/06/2023)    Housing/Utilities     Within the past 12 months, have you ever stayed: outside, in a car, in a tent, in an overnight shelter, or temporarily in someone else's home (i.e. couch-surfing)?: No     Are you worried about losing your housing?: No     Within the past 12 months, have you been unable to get utilities (heat, electricity) when it was really needed?: No   Alcohol Use: Not on file   Transportation Needs: No Transportation Needs (01/14/2023)    PRAPARE - Transportation     Lack of Transportation (Medical): No     Lack of Transportation (Non-Medical): No   Substance Use: Not on file   Health Literacy: Medium Risk (01/14/2023)    Health Literacy     : Sometimes   Physical Activity: Not on file   Interpersonal Safety: Not on file   Stress: Not on file   Intimate Partner Violence: Not on file   Depression: Not at risk (01/25/2023)  PHQ-2     PHQ-2 Score: 0   Social Connections: Not on file       Would you be willing to receive help with any of the needs that you have identified today? Not applicable       Arlana Lindau, PharmD  [facility] Specialty Pharmacist

## 2023-03-15 ENCOUNTER — Ambulatory Visit: Admit: 2023-03-15 | Discharge: 2023-03-16 | Payer: PRIVATE HEALTH INSURANCE

## 2023-03-15 DIAGNOSIS — L089 Local infection of the skin and subcutaneous tissue, unspecified: Principal | ICD-10-CM

## 2023-03-15 MED ORDER — FLUCONAZOLE 150 MG TABLET
ORAL_TABLET | ORAL | 0 refills | 42 days | Status: CP
Start: 2023-03-15 — End: 2023-04-20

## 2023-03-15 MED ORDER — DOXYCYCLINE HYCLATE 100 MG CAPSULE
ORAL_CAPSULE | Freq: Two times a day (BID) | ORAL | 0 refills | 10 days | Status: CP
Start: 2023-03-15 — End: 2023-03-25

## 2023-03-15 NOTE — Unmapped (Signed)
-   Take Diflucan once daily for 6-week  -Doxycycline twice daily with meals  -Keep the skin as dry as possible  -Contact the clinic if your symptoms are not improving or if you develop any new abnormal symptoms

## 2023-03-15 NOTE — Unmapped (Signed)
[facility] at [facility] Visit      Assessment/Plan:      Brandi Flynn was seen today for yeast under breast .    Diagnoses and all orders for this visit:    Skin infection  Assessment & Plan:  - Take Diflucan once daily for 6-week  -Doxycycline twice daily with meals  -Keep the skin as dry as possible  -Contact the clinic if your symptoms are not improving or if you develop any new abnormal symptoms    Orders:  -     fluconazole (DIFLUCAN) 150 MG tablet; Take 1 tablet (150 mg total) by mouth once a week for 6 doses.  -     doxycycline (VIBRAMYCIN) 100 MG capsule; Take 1 capsule (100 mg total) by mouth two (2) times a day for 10 days.             Return in about 1 week (around 03/22/2023) for Recheck.     Subjective:     HPI:    Brandi Flynn came  the clinic to discuss skin infection    Patient reports that she developed redness, itching, and watery discharge under her left breast over 2 weeks ago.  Reports that redness became gradually worse.  Patient has tried nystatin powder-reports no improvement after using topical nystatin powder.  Patient reports that she sweats a lot recently.   Denies fever, generalized rash.     In the past patient was diagnosed with tenia corporis and was prescribed nystatin powder.  Erythema was present under right breast.  Nystatin powder worked well for erythema underneath right breast.   Patient denies any other areas with erythema or edema.             Review of systems negative unless otherwise noted as per HPI.    Objective:     Visit Vitals  BP 123/89   Pulse 99   Temp 36.4 ??C (97.5 ??F) (Temporal)   Wt (!) 138.1 kg (304 lb 6.4 oz)   BMI 53.92 kg/m??       PHYSICAL EXAM:    Physical Exam  Vitals reviewed.   Constitutional:       Appearance: She is obese.   Cardiovascular:      Rate and Rhythm: Normal rate and regular rhythm.   Pulmonary:      Effort: Pulmonary effort is normal.      Breath sounds: Normal breath sounds.   Skin:     Findings: Erythema present.             Comments: There is a large area with erythema, edema, skin thickening underneath left breast (15-20 cm in diameter) there is a moderate amount of clear discharge on the top of the skin.     Skin underneath right breast has mild erythema.  Skin is intact and dry.            [name], FNP  Note - This record has been created using [employer]. Chart creation errors have been sought, but may not always have been located. Such creation errors do not reflect on the standard of medical care.

## 2023-03-16 NOTE — Unmapped (Signed)
Patient called stating that she needed to speak with someone regarding the new medication she is on and the current side effects she is having. Patient did not give any other information in the voicemail.

## 2023-03-17 ENCOUNTER — Ambulatory Visit
Admit: 2023-03-17 | Discharge: 2023-03-18 | Disposition: A | Discharge status: Home / Self Care | Payer: PRIVATE HEALTH INSURANCE | Attending: Emergency Medicine

## 2023-03-17 DIAGNOSIS — R112 Nausea with vomiting, unspecified: Principal | ICD-10-CM

## 2023-03-17 DIAGNOSIS — T887XXA Unspecified adverse effect of drug or medicament, initial encounter: Principal | ICD-10-CM

## 2023-03-17 LAB — CBC W/ AUTO DIFF
BASOPHILS ABSOLUTE COUNT: 0.1 10*9/L (ref 0.0–0.1)
BASOPHILS RELATIVE PERCENT: 0.5 %
EOSINOPHILS ABSOLUTE COUNT: 0.2 10*9/L (ref 0.0–0.5)
EOSINOPHILS RELATIVE PERCENT: 1.6 %
HEMATOCRIT: 46.3 % — ABNORMAL HIGH (ref 34.0–44.0)
HEMOGLOBIN: 15.7 g/dL — ABNORMAL HIGH (ref 11.3–14.9)
LYMPHOCYTES ABSOLUTE COUNT: 2.1 10*9/L (ref 1.1–3.6)
LYMPHOCYTES RELATIVE PERCENT: 17.1 %
MEAN CORPUSCULAR HEMOGLOBIN CONC: 33.8 g/dL (ref 32.3–35.0)
MEAN CORPUSCULAR HEMOGLOBIN: 29.5 pg (ref 25.9–32.4)
MEAN CORPUSCULAR VOLUME: 87.2 fL (ref 77.6–95.7)
MEAN PLATELET VOLUME: 8.5 fL (ref 7.3–10.7)
MONOCYTES ABSOLUTE COUNT: 1 10*9/L — ABNORMAL HIGH (ref 0.3–0.8)
MONOCYTES RELATIVE PERCENT: 7.9 %
NEUTROPHILS ABSOLUTE COUNT: 9.1 10*9/L — ABNORMAL HIGH (ref 1.5–6.4)
NEUTROPHILS RELATIVE PERCENT: 72.9 %
NUCLEATED RED BLOOD CELLS: 0 /100{WBCs} (ref <=4)
PLATELET COUNT: 285 10*9/L (ref 170–380)
RED BLOOD CELL COUNT: 5.31 10*12/L — ABNORMAL HIGH (ref 3.95–5.13)
RED CELL DISTRIBUTION WIDTH: 13.3 % (ref 12.2–15.2)
WBC ADJUSTED: 12.5 10*9/L — ABNORMAL HIGH (ref 4.2–10.2)

## 2023-03-17 LAB — PREGNANCY, URINE: PREGNANCY TEST URINE: NEGATIVE

## 2023-03-17 LAB — URINALYSIS WITH MICROSCOPY WITH CULTURE REFLEX PERFORMABLE
BACTERIA: NONE SEEN /HPF
BILIRUBIN UA: NEGATIVE
GLUCOSE UA: NEGATIVE
KETONES UA: NEGATIVE
LEUKOCYTE ESTERASE UA: NEGATIVE
NITRITE UA: NEGATIVE
PH UA: 7.5 (ref 5.0–9.0)
PROTEIN UA: NEGATIVE
RBC UA: 3 /HPF (ref <=4)
SPECIFIC GRAVITY UA: 1.015 (ref 1.003–1.030)
SQUAMOUS EPITHELIAL: 4 /HPF (ref 0–5)
UROBILINOGEN UA: <2
WBC UA: 7 /HPF — ABNORMAL HIGH (ref 0–5)

## 2023-03-17 LAB — BASIC METABOLIC PANEL
ANION GAP: 7 mmol/L (ref 5–14)
BLOOD UREA NITROGEN: 7 mg/dL — ABNORMAL LOW (ref 9–23)
BUN / CREAT RATIO: 12
CALCIUM: 10.1 mg/dL (ref 8.7–10.4)
CHLORIDE: 107 mmol/L (ref 98–107)
CO2: 27.6 mmol/L (ref 20.0–31.0)
CREATININE: 0.58 mg/dL
EGFR CKD-EPI (2021) FEMALE: >90 mL/min/{1.73_m2} (ref >=60)
GLUCOSE RANDOM: 87 mg/dL (ref 70–179)
POTASSIUM: 3.9 mmol/L (ref 3.4–4.8)
SODIUM: 142 mmol/L (ref 135–145)

## 2023-03-17 LAB — HEPATIC FUNCTION PANEL
ALBUMIN: 4.1 g/dL (ref 3.4–5.0)
ALKALINE PHOSPHATASE: 66 U/L (ref 46–116)
ALT (SGPT): 53 U/L — ABNORMAL HIGH (ref 10–49)
AST (SGOT): 35 U/L — ABNORMAL HIGH (ref <=34)
BILIRUBIN DIRECT: 0.2 mg/dL (ref 0.00–0.30)
BILIRUBIN TOTAL: 0.5 mg/dL (ref 0.3–1.2)
PROTEIN TOTAL: 8.1 g/dL (ref 5.7–8.2)

## 2023-03-17 MED ADMIN — dextrose 5 % and sodium chloride 0.9 % infusion: 100 mL/h | INTRAVENOUS | @ 23:00:00 | Stop: 2023-03-17

## 2023-03-17 MED ADMIN — ondansetron (ZOFRAN) injection 4 mg: 4 mg | INTRAVENOUS | @ 22:00:00 | Stop: 2023-03-17

## 2023-03-17 MED ADMIN — lactated ringers bolus 1,000 mL: 1000 mL | INTRAVENOUS | @ 22:00:00 | Stop: 2023-03-17

## 2023-03-17 NOTE — Unmapped (Signed)
This RN attempted to call patient to discuss concerns around medication side effects.  Unable to leave voicemail, mailbox was full.

## 2023-03-17 NOTE — Unmapped (Signed)
Pt returned Nurses call, sent secure chat but Nurse not available, requested to call pt back.

## 2023-03-18 NOTE — Unmapped (Signed)
Pt arrived from [facility] from home. Pt started new med Teriflunomide for her MS Sat and started having N/V/D sun.  Provider told her any reaction like this to come in.

## 2023-03-18 NOTE — Unmapped (Signed)
[facility] Emergency Department Provider Note     ED Clinical Impression     Final diagnoses:   Nausea and vomiting, unspecified vomiting type (Primary)   Medication side effect       ED Course, Assessment and Plan     Initial Clinical Impression:    March 17, 2023 5:20 PM     BP 131/72  - Pulse 89  - Temp 37 ??C (98.6 ??F) (Oral)  - Resp 18  - Ht 160 cm (5' 3)  - Wt (!) 137.4 kg (303 lb)  - SpO2 98%  - BMI 53.67 kg/m??     22 y.o. female Pertinent PMH of MS who presents for evaluation of nausea, NBNB emesis, non-bloody diarrhea, and mild abdominal pain since starting teriflunomide on 6/15.    Focal Exam: On exam, patient is alert and in no acute distress. Vital signs within normal limits. Physical exam revealed heart rate and rhythm regular. Lungs clear to auscultation bilaterally. Abdomen soft and non tender. No guarding or rebound.     Ddx/MDM: At this time believe patient is also experiencing nausea and vomiting secondary to side effects from her new MS medication as above.  Patient's abdomen soft and nontender I have low suspicion for intra-abdominal pathology.  Lab works otherwise normal and reassuring she has no signs of infection, lungs clear and urine normal.  Patient feeling improved prior to discharge.  Follow-up outpatient.      ED Course:             Diagnostic and treatment orders as below.     Orders Placed This Encounter   Procedures    Urine Culture    CBC w/ Differential    Basic metabolic panel    Hepatic Function Panel    Urinalysis with Microscopy with Culture Reflex    Pregnancy, urine    Nutrition Therapy Regular/House    Insert Peripheral IV    PO Challenge       Medications   promethazine (PHENERGAN) 12.5 mg in sodium chloride 0.9 % 25 mL IVPB (premix) (12.5 mg Intravenous Not Given 03/17/23 2030)   dextrose 5 % and sodium chloride 0.9 % infusion (0 mL/hr Intravenous Stopped 03/17/23 2134)   ondansetron (ZOFRAN) injection 4 mg (4 mg Intravenous Given 03/17/23 1757)   lactated ringers bolus 1,000 mL (0 mL Intravenous Stopped 03/17/23 1857)         Independent Interpretation of Studies: I have independently interpreted the following studies:  As above    Discussion of Management With Other Providers or Support Staff: I discussed the management of this patient with the:  As above    Considerations Regarding Additional Studies/Disposition/Escalation of Care and Critical Care:  None    _____________________________________________________________________    The case was discussed with attending physician who is in agreement with the above assessment and plan    Additional Medical Decision Making     CHIEF COMPLAINT:   Chief Complaint   Patient presents with    Emesis       HPI: Brandi Flynn is a 22 y.o. female with a past medical history of MS who presents to the ED for evaluation of emesis. The patient reports since starting teriflunomide for her MS on 6/15, she has had experienced persistent and worsening nausea, NBNB emesis (2-3 episodes total), non-bloody diarrhea (5-6 episodes a day), and mild lower abdominal pain. She has been unable to tolerate PO intake secondary to her symptoms. Additionally, she endorses headaches today and  a few days of increased urinary frequency with dark colored urine. She denies dysuria.    Outside Historian(s): None    External Records Reviewed: I have reviewed 03/07/2023 Neurology Office Visit - reviewed patient's history of MS, recently started on teriflunomide.    Physical Exam     VITAL SIGNS:    BP 131/72  - Pulse 89  - Temp 37 ??C (98.6 ??F) (Oral)  - Resp 18  - Ht 160 cm (5' 3)  - Wt (!) 137.4 kg (303 lb)  - SpO2 98%  - BMI 53.67 kg/m??     Const: Alert and oriented. Well appearing NAD  Eyes: Conjunctivae are normal. Sclera is non-icteric, PERRL  HENT: NCAT, MMM, oropharynx clear  Neck: Supple. No stridor. Trachea is midline  Heme: No cervical LAD.   CV: RRR, normal S1, S2, no murmurs. Extremities are warm and well perfused. No LE swelling   Resp: Normal respiratory effort. Breath Sounds CTAB, no wheezes, rhonchi, or crackles.  GI: Soft, NTND. No rebound or guarding.  MSK: No acute long bone deformities.  Neuro: Normal speech and language. No gross FNDs are appreciated.  Skin: Skin is warm, dry and intact. No rash noted.  Psych: Mood and affect are normal. Judgement and insight are at appropriate baseline.    History     PAST MEDICAL HISTORY/PAST SURGICAL HISTORY:   Past Medical History:   Diagnosis Date    Disease of thyroid gland     Multiple sclerosis (CMS-HCC)     Obesity     Vertigo        No past surgical history on file.    MEDICATIONS:     Current Facility-Administered Medications:     promethazine (PHENERGAN) 12.5 mg in sodium chloride 0.9 % 25 mL IVPB (premix), 12.5 mg, Intravenous, Once, Roselie Skinner, MD    Current Outpatient Medications:     baclofen (LIORESAL) 10 MG tablet, Take 1 tablet (10 mg total) by mouth daily. (Patient not taking: Reported on 03/07/2023), Disp: 30 tablet, Rfl: 1    carboxymethylcellulose sodium (REFRESH CELLUVISC) 1 % DpGe, Administer 1 drop to both eyes four (4) times a day., Disp: 1 each, Rfl: 0    cetirizine (ZYRTEC) 10 MG tablet, Take 1 tablet (10 mg total) by mouth daily as needed for allergies., Disp: 30 tablet, Rfl: 0    doxycycline (VIBRAMYCIN) 100 MG capsule, Take 1 capsule (100 mg total) by mouth two (2) times a day for 10 days., Disp: 20 capsule, Rfl: 0    fluconazole (DIFLUCAN) 150 MG tablet, Take 1 tablet (150 mg total) by mouth once a week for 6 doses., Disp: 6 tablet, Rfl: 0    loperamide (IMODIUM A-D) 2 mg tablet, Take 1 tablet (2 mg total) by mouth four (4) times a day as needed for diarrhea., Disp: 30 tablet, Rfl: 0    nystatin (MYCOSTATIN) 100,000 unit/gram powder, Apply 1 Application topically two (2) times a day., Disp: 60 g, Rfl: 1    ondansetron (ZOFRAN-ODT) 8 MG disintegrating tablet, Dissolve 1 tablet (8 mg total) in the mouth every eight (8) hours as needed for nausea., Disp: 90 tablet, Rfl: 0    tamsulosin (FLOMAX) 0.4 mg capsule, Take 1 capsule (0.4 mg total) by mouth daily., Disp: 90 capsule, Rfl: 3    teriflunomide 14 mg Tab, Take 1 tablet (14 mg total) by mouth in the morning., Disp: 30 tablet, Rfl: 5    ALLERGIES:   Blueberry and Peanut  SOCIAL HISTORY:   Social History     Tobacco Use    Smoking status: Every Day     Types: e-Cigarettes    Smokeless tobacco: Never    Tobacco comments:     Vape   Substance Use Topics    Alcohol use: Not Currently       FAMILY HISTORY:  Family History   Problem Relation Age of Onset    Thyroid disease Mother     Thyroid disease Maternal Grandmother     Thyroid disease Maternal Grandfather     Thyroid disease Paternal Grandmother     Vitiligo Paternal Grandmother           Radiology     No orders to display       Labs     Labs Reviewed   BASIC METABOLIC PANEL - Abnormal; Notable for the following components:       Result Value    BUN 7 (*)     All other components within normal limits   HEPATIC FUNCTION PANEL - Abnormal; Notable for the following components:    AST 35 (*)     ALT 53 (*)     All other components within normal limits   CBC W/ AUTO DIFF - Abnormal; Notable for the following components:    WBC 12.5 (*)     RBC 5.31 (*)     HGB 15.7 (*)     HCT 46.3 (*)     Absolute Neutrophils 9.1 (*)     Absolute Monocytes 1.0 (*)     All other components within normal limits   URINALYSIS WITH MICROSCOPY WITH CULTURE REFLEX PERFORMABLE - Abnormal; Notable for the following components:    Blood, UA Trace (*)     WBC, UA 7 (*)     Mucus, UA Rare (*)     All other components within normal limits   PREGNANCY, URINE - Normal   URINE CULTURE   CBC W/ DIFFERENTIAL    Narrative:     The following orders were created for panel order CBC w/ Differential.                  Procedure                               Abnormality         Status                                     ---------                               -----------         ------                                     CBC w/ [id number]] Abnormal            Final result                                                 Please view results for  these tests on the individual orders.   URINALYSIS WITH MICROSCOPY WITH CULTURE REFLEX    Narrative:     The following orders were created for panel order Urinalysis with Microscopy with Culture Reflex.                  Procedure                               Abnormality         Status                                     ---------                               -----------         ------                                     Urinalysis with Microsc.Marland KitchenMarland Kitchen[[id number]]  Abnormal            Final result                                                 Please view results for these tests on the individual orders.         Pertinent labs & imaging results that were available during my care of the patient were reviewed by me and considered in my medical decision making (see chart for details).    Please note- This chart has been created using [employer]. Chart creation errors have been sought, but may not always be located and such creation errors, especially pronoun confusion, do NOT reflect on the standard of medical care.    Documentation assistance was provided by Nadene Rubins, Scribe on March 17, 2023 at 5:23 PM for Estill Dooms, MD.  Documentation assistance was provided by the scribe in my presence.  The documentation recorded by the scribe has been reviewed by me and accurately reflects the services I personally performed.             Roselie Skinner, MD  Resident  03/17/23 [phone]

## 2023-03-18 NOTE — Unmapped (Signed)
Neurology Clinical Pharmacist Telephone Call    Medication: Veatrice Bourbon and spoke with patient to follow up on side effects with Aubagio. Patient was experiencing side effects after starting Aubagio since Saturday. She complaint of constant headache, diarrhea (3-6 times a day, baseline was average once a day) and severe nausea. Her sense of taste was change as patient claims everything tastes like water. Due to nausea, patient hasn't been eating much except for crackers. Last dose of Aubagio was on Wednesday evening.     On Thursday, patient was unable to keep anything down and unable to tolerate PO intake. She then proceed to go to the ED, where they provided her with IV fluids and antiemetics. Patient reports that her nausea is under control, but she still experiences headache and diarrhea.     Patient is aware to let us know if there is any change in symptoms and we will check in about a week.    Tawanna Cooler, PharmD  PGY1 Pharmacy Resident    Worthy Flank, PharmD, CPP  Clinical Pharmacist, [facility]  Phone: [phone]

## 2023-03-20 ENCOUNTER — Ambulatory Visit
Admit: 2023-03-20 | Discharge: 2023-03-21 | Disposition: A | Discharge status: Home / Self Care | Payer: PRIVATE HEALTH INSURANCE

## 2023-03-20 ENCOUNTER — Emergency Department
Admit: 2023-03-20 | Discharge: 2023-03-21 | Disposition: A | Discharge status: Home / Self Care | Payer: PRIVATE HEALTH INSURANCE

## 2023-03-20 DIAGNOSIS — N3000 Acute cystitis without hematuria: Principal | ICD-10-CM

## 2023-03-20 LAB — URINALYSIS WITH MICROSCOPY WITH CULTURE REFLEX PERFORMABLE
BILIRUBIN UA: NEGATIVE
GLUCOSE UA: NEGATIVE
KETONES UA: NEGATIVE
NITRITE UA: NEGATIVE
PH UA: 6.5 (ref 5.0–9.0)
PROTEIN UA: NEGATIVE
RBC UA: 4 /HPF (ref <=4)
SPECIFIC GRAVITY UA: 1.013 (ref 1.003–1.030)
SQUAMOUS EPITHELIAL: 2 /HPF (ref 0–5)
UROBILINOGEN UA: <2
WBC UA: 10 /HPF — ABNORMAL HIGH (ref 0–5)

## 2023-03-20 LAB — CBC W/ AUTO DIFF
BASOPHILS ABSOLUTE COUNT: 0.1 10*9/L (ref 0.0–0.1)
BASOPHILS RELATIVE PERCENT: 0.6 %
EOSINOPHILS ABSOLUTE COUNT: 0.3 10*9/L (ref 0.0–0.5)
EOSINOPHILS RELATIVE PERCENT: 2.3 %
HEMATOCRIT: 44 % (ref 34.0–44.0)
HEMOGLOBIN: 14.9 g/dL (ref 11.3–14.9)
LYMPHOCYTES ABSOLUTE COUNT: 2.3 10*9/L (ref 1.1–3.6)
LYMPHOCYTES RELATIVE PERCENT: 20.4 %
MEAN CORPUSCULAR HEMOGLOBIN CONC: 34 g/dL (ref 32.3–35.0)
MEAN CORPUSCULAR HEMOGLOBIN: 29.5 pg (ref 25.9–32.4)
MEAN CORPUSCULAR VOLUME: 86.9 fL (ref 77.6–95.7)
MEAN PLATELET VOLUME: 8.7 fL (ref 7.3–10.7)
MONOCYTES ABSOLUTE COUNT: 0.9 10*9/L — ABNORMAL HIGH (ref 0.3–0.8)
MONOCYTES RELATIVE PERCENT: 8.3 %
NEUTROPHILS ABSOLUTE COUNT: 7.8 10*9/L — ABNORMAL HIGH (ref 1.5–6.4)
NEUTROPHILS RELATIVE PERCENT: 68.4 %
NUCLEATED RED BLOOD CELLS: 0 /100{WBCs} (ref <=4)
PLATELET COUNT: 258 10*9/L (ref 170–380)
RED BLOOD CELL COUNT: 5.06 10*12/L (ref 3.95–5.13)
RED CELL DISTRIBUTION WIDTH: 13.4 % (ref 12.2–15.2)
WBC ADJUSTED: 11.4 10*9/L — ABNORMAL HIGH (ref 4.2–10.2)

## 2023-03-20 LAB — COMPREHENSIVE METABOLIC PANEL
ALBUMIN: 3.8 g/dL (ref 3.4–5.0)
ALKALINE PHOSPHATASE: 60 U/L (ref 46–116)
ALT (SGPT): 50 U/L — ABNORMAL HIGH (ref 10–49)
ANION GAP: 6 mmol/L (ref 5–14)
AST (SGOT): 34 U/L (ref <=34)
BILIRUBIN TOTAL: 0.4 mg/dL (ref 0.3–1.2)
BLOOD UREA NITROGEN: 6 mg/dL — ABNORMAL LOW (ref 9–23)
BUN / CREAT RATIO: 12
CALCIUM: 9.5 mg/dL (ref 8.7–10.4)
CHLORIDE: 108 mmol/L — ABNORMAL HIGH (ref 98–107)
CO2: 25.8 mmol/L (ref 20.0–31.0)
CREATININE: 0.49 mg/dL — ABNORMAL LOW
EGFR CKD-EPI (2021) FEMALE: >90 mL/min/{1.73_m2} (ref >=60)
GLUCOSE RANDOM: 85 mg/dL (ref 70–179)
POTASSIUM: 4 mmol/L (ref 3.4–4.8)
PROTEIN TOTAL: 7.5 g/dL (ref 5.7–8.2)
SODIUM: 140 mmol/L (ref 135–145)

## 2023-03-20 LAB — HCG QUANTITATIVE, BLOOD: GONADOTROPIN, CHORIONIC (HCG) QUANT: <2.6 m[IU]/mL

## 2023-03-20 MED ORDER — CEPHALEXIN 500 MG CAPSULE
ORAL_CAPSULE | Freq: Four times a day (QID) | ORAL | 0 refills | 5 days | Status: CP
Start: 2023-03-20 — End: 2023-03-25

## 2023-03-20 MED ADMIN — lactated ringers bolus 1,000 mL: 1000 mL | INTRAVENOUS | @ 23:00:00 | Stop: 2023-03-20

## 2023-03-20 MED ADMIN — prochlorperazine (COMPAZINE) injection 10 mg: 10 mg | INTRAVENOUS | Stop: 2023-03-20

## 2023-03-21 NOTE — Unmapped (Signed)
Pt is coming in with c/o having some chronic pain in the abd with some n/v/d and having some trouble urinating. Pt with no acute distress, no interventions by EMS.

## 2023-03-21 NOTE — Unmapped (Shared)
 [facility]  Emergency Department Provider Note     HPI     Brandi Flynn is a 22 y.o. female with a past medical history of MS presenting via EMS with abdominal pain. The patient reports a couple days of RUQ pain as well as 2 weeks of emesis and diarrhea in the setting of recently starting a MS medication called teriflunomide. She reports she was initially relieving her symptoms with Zofran; however, she states it is no longer effective. Diminished PO intake over the past week secondary to nausea. Additionally, she reports bilateral feet swelling and pain that is not relieved with elevating her legs. She denies being more sedentary since symptoms onset; however, she notes increased difficulty with ambulation secondary to MS. She stopped taking her teriflunomide on 03/16/23. Denies dysuria, chest pain, shortness of breath or fevers.     Per chart review, she was seen in the ED 03/17/23 for similar complaints including nausea, emesis, diarrhea and abdominal pain in the setting of starting teriflunomide for her MS on 03/12/23. She was treated with fluids and Zofran with relief. Her workup was unremarkable and she was discharged home with outpatient follow up with her neurologist.      Physical Exam     BP 139/76  - Pulse 102  - Temp 36.7 ??C (98.1 ??F) (Oral)  - Resp 16  - Wt (!) 137.4 kg (303 lb)  - SpO2 97%  - BMI 53.67 kg/m??      Constitutional: In no acute distress.  Eyes: Conjunctivae are normal. EOMI.   HEENT: Normocephalic and atraumatic. Mucous membranes are moist. Neck ***  Neck: Full active range of motion  Cardiovascular: See heart rate listed above.  No murmurs appreciated.  Normal skin perfusion.   Respiratory: See respiratory rate listed above.  Lungs are clear to auscultation bilaterally.  Speaking easily in full sentences  Gastrointestinal: Mild RUQ and suprapubic tenderness to palpation. No peritoneal signs.  Musculoskeletal: No long bone deformities.   Neurologic: Normal speech and language. No gross focal neurologic deficits are appreciated.   Skin: Skin is warm, dry and intact.  Psychiatric: Mood and affect are normal.    MDM     ***. We will obtain the diagnostic workup noted below, including ***. Disposition pending further work-up, however anticipate ***    Orders Placed This Encounter   Procedures    Comprehensive Metabolic Panel    CBC w/ Differential    Urinalysis with Microscopy with Culture Reflex            The case was discussed with the attending physician who is in agreement with the above assessment and plan.    - Any discussion of this patient's case/presentation between myself and consultants, admitting teams, or other team members has been documented above.  - Imaging and other studies, if performed, that were available during my care of the patient were independently reviewed and interpreted by me and considered in my medical decision making as documented above.  - External records reviewed: 03/15/23 [facility]am Med office note to obtain PMH. ED note 03/17/23 for chart review.  - Consideration of admission, observation, transfer, or escalation of care: ***     ED Clinical Impression     Final diagnoses:   None          Past History     PAST MEDICAL HISTORY/PAST SURGICAL HISTORY:   Past Medical History:   Diagnosis Date    Disease of thyroid gland  Multiple sclerosis (CMS-HCC)     Obesity     Vertigo        No past surgical history on file.    MEDICATIONS:     Current Facility-Administered Medications:     lactated ringers bolus 1,000 mL, 1,000 mL, Intravenous, Once, Rodrigo Ran, MD    Current Outpatient Medications:     baclofen (LIORESAL) 10 MG tablet, Take 1 tablet (10 mg total) by mouth daily. (Patient not taking: Reported on 03/07/2023), Disp: 30 tablet, Rfl: 1    carboxymethylcellulose sodium (REFRESH CELLUVISC) 1 % DpGe, Administer 1 drop to both eyes four (4) times a day., Disp: 1 each, Rfl: 0    cetirizine (ZYRTEC) 10 MG tablet, Take 1 tablet (10 mg total) by mouth daily as needed for allergies., Disp: 30 tablet, Rfl: 0    doxycycline (VIBRAMYCIN) 100 MG capsule, Take 1 capsule (100 mg total) by mouth two (2) times a day for 10 days., Disp: 20 capsule, Rfl: 0    fluconazole (DIFLUCAN) 150 MG tablet, Take 1 tablet (150 mg total) by mouth once a week for 6 doses., Disp: 6 tablet, Rfl: 0    loperamide (IMODIUM A-D) 2 mg tablet, Take 1 tablet (2 mg total) by mouth four (4) times a day as needed for diarrhea., Disp: 30 tablet, Rfl: 0    nystatin (MYCOSTATIN) 100,000 unit/gram powder, Apply 1 Application topically two (2) times a day., Disp: 60 g, Rfl: 1    ondansetron (ZOFRAN-ODT) 8 MG disintegrating tablet, Dissolve 1 tablet (8 mg total) in the mouth every eight (8) hours as needed for nausea., Disp: 90 tablet, Rfl: 0    tamsulosin (FLOMAX) 0.4 mg capsule, Take 1 capsule (0.4 mg total) by mouth daily., Disp: 90 capsule, Rfl: 3    teriflunomide 14 mg Tab, Take 1 tablet (14 mg total) by mouth in the morning., Disp: 30 tablet, Rfl: 5    ALLERGIES:   Blueberry and Peanut    SOCIAL HISTORY:   Social History     Tobacco Use    Smoking status: Every Day     Types: e-Cigarettes    Smokeless tobacco: Never    Tobacco comments:     Vape   Substance Use Topics    Alcohol use: Not Currently       FAMILY HISTORY:  Family History   Problem Relation Age of Onset    Thyroid disease Mother     Thyroid disease Maternal Grandmother     Thyroid disease Maternal Grandfather     Thyroid disease Paternal Grandmother     Vitiligo Paternal Grandmother         Vitals     Vitals:    03/20/23 1824   BP: 139/76   Pulse: 102   Resp: 16   Temp: 36.7 ??C (98.1 ??F)   TempSrc: Oral   SpO2: 97%   Weight: (!) 137.4 kg (303 lb)         Radiology     No orders to display        Laboratory Data     Lab Results   Component Value Date    WBC 12.5 (H) 03/17/2023    HGB 15.7 (H) 03/17/2023    HCT 46.3 (H) 03/17/2023    PLT 285 03/17/2023       Lab Results   Component Value Date    NA 142 03/17/2023    K 3.9 03/17/2023    CL 107 03/17/2023    CO2 27.6  03/17/2023    BUN 7 (L) 03/17/2023    CREATININE 0.58 03/17/2023    GLU 87 03/17/2023    CALCIUM 10.1 03/17/2023    MG 1.8 01/13/2023    PHOS 4.3 01/13/2023       Lab Results   Component Value Date    BILITOT 0.5 03/17/2023    BILIDIR 0.20 03/17/2023    PROT 8.1 03/17/2023    ALBUMIN 4.1 03/17/2023    ALT 53 (H) 03/17/2023    AST 35 (H) 03/17/2023    ALKPHOS 66 03/17/2023       No results found for: LABPROT, INR, APTT    Portions of this record have been created using Scientist, clinical (histocompatibility and immunogenetics). Dictation errors have been sought, but may not have been identified and corrected.    Documentation assistance was provided by Kela Millin, Scribe, on March 20, 2023 at 6:45 PM for Baltazar Apo, MD    {*** note to provider: please use [email or web address] to enter a scribe attestation}

## 2023-03-21 NOTE — Unmapped (Signed)
Discharge instructions explained to patient. Pt verbalized understanding. IV removed. All vital signs within normal limits. Ambulatory to discharge with walker.

## 2023-03-23 ENCOUNTER — Ambulatory Visit
Admit: 2023-03-23 | Discharge: 2023-03-23 | Disposition: A | Discharge status: Home / Self Care | Payer: PRIVATE HEALTH INSURANCE | Attending: Emergency Medicine

## 2023-03-23 ENCOUNTER — Emergency Department
Admit: 2023-03-23 | Discharge: 2023-03-23 | Disposition: A | Discharge status: Home / Self Care | Payer: PRIVATE HEALTH INSURANCE | Attending: Emergency Medicine

## 2023-03-23 DIAGNOSIS — N23 Unspecified renal colic: Principal | ICD-10-CM

## 2023-03-23 DIAGNOSIS — N200 Calculus of kidney: Principal | ICD-10-CM

## 2023-03-23 LAB — COMPREHENSIVE METABOLIC PANEL
ALBUMIN: 4 g/dL (ref 3.4–5.0)
ALKALINE PHOSPHATASE: 60 U/L (ref 46–116)
ALT (SGPT): 51 U/L — ABNORMAL HIGH (ref 10–49)
ANION GAP: 8 mmol/L (ref 5–14)
AST (SGOT): 36 U/L — ABNORMAL HIGH (ref <=34)
BILIRUBIN TOTAL: 0.6 mg/dL (ref 0.3–1.2)
BLOOD UREA NITROGEN: 8 mg/dL — ABNORMAL LOW (ref 9–23)
BUN / CREAT RATIO: 10
CALCIUM: 9.5 mg/dL (ref 8.7–10.4)
CHLORIDE: 109 mmol/L — ABNORMAL HIGH (ref 98–107)
CO2: 24.6 mmol/L (ref 20.0–31.0)
CREATININE: 0.77 mg/dL
EGFR CKD-EPI (2021) FEMALE: >90 mL/min/{1.73_m2} (ref >=60)
GLUCOSE RANDOM: 102 mg/dL (ref 70–179)
POTASSIUM: 3.8 mmol/L (ref 3.4–4.8)
PROTEIN TOTAL: 7.4 g/dL (ref 5.7–8.2)
SODIUM: 142 mmol/L (ref 135–145)

## 2023-03-23 LAB — CBC W/ AUTO DIFF
BASOPHILS ABSOLUTE COUNT: 0.1 10*9/L (ref 0.0–0.1)
BASOPHILS RELATIVE PERCENT: 0.6 %
EOSINOPHILS ABSOLUTE COUNT: 0.1 10*9/L (ref 0.0–0.5)
EOSINOPHILS RELATIVE PERCENT: 0.5 %
HEMATOCRIT: 43.4 % (ref 34.0–44.0)
HEMOGLOBIN: 14.9 g/dL (ref 11.3–14.9)
LYMPHOCYTES ABSOLUTE COUNT: 2 10*9/L (ref 1.1–3.6)
LYMPHOCYTES RELATIVE PERCENT: 12.4 %
MEAN CORPUSCULAR HEMOGLOBIN CONC: 34.3 g/dL (ref 32.3–35.0)
MEAN CORPUSCULAR HEMOGLOBIN: 29.7 pg (ref 25.9–32.4)
MEAN CORPUSCULAR VOLUME: 86.7 fL (ref 77.6–95.7)
MEAN PLATELET VOLUME: 8.7 fL (ref 7.3–10.7)
MONOCYTES ABSOLUTE COUNT: 1.1 10*9/L — ABNORMAL HIGH (ref 0.3–0.8)
MONOCYTES RELATIVE PERCENT: 6.7 %
NEUTROPHILS ABSOLUTE COUNT: 13 10*9/L — ABNORMAL HIGH (ref 1.5–6.4)
NEUTROPHILS RELATIVE PERCENT: 79.8 %
NUCLEATED RED BLOOD CELLS: 0 /100{WBCs} (ref <=4)
PLATELET COUNT: 256 10*9/L (ref 170–380)
RED BLOOD CELL COUNT: 5.01 10*12/L (ref 3.95–5.13)
RED CELL DISTRIBUTION WIDTH: 13.4 % (ref 12.2–15.2)
WBC ADJUSTED: 16.2 10*9/L — ABNORMAL HIGH (ref 4.2–10.2)

## 2023-03-23 LAB — URINALYSIS WITH MICROSCOPY WITH CULTURE REFLEX PERFORMABLE
BACTERIA: NONE SEEN /HPF
BILIRUBIN UA: NEGATIVE
GLUCOSE UA: NEGATIVE
NITRITE UA: NEGATIVE
PH UA: 6.5 (ref 5.0–9.0)
PROTEIN UA: 70 — AB
RBC UA: >182 /HPF — ABNORMAL HIGH (ref <=4)
SPECIFIC GRAVITY UA: 1.032 — ABNORMAL HIGH (ref 1.003–1.030)
SQUAMOUS EPITHELIAL: 13 /HPF — ABNORMAL HIGH (ref 0–5)
UROBILINOGEN UA: <2
WBC UA: 26 /HPF — ABNORMAL HIGH (ref 0–5)

## 2023-03-23 LAB — PREGNANCY, URINE: PREGNANCY TEST URINE: NEGATIVE

## 2023-03-23 LAB — LIPASE: LIPASE: 35 U/L (ref 12–53)

## 2023-03-23 LAB — HCG QUANTITATIVE, BLOOD: GONADOTROPIN, CHORIONIC (HCG) QUANT: <2.6 m[IU]/mL

## 2023-03-23 MED ORDER — KETOROLAC 10 MG TABLET
ORAL_TABLET | Freq: Four times a day (QID) | ORAL | 0 refills | 5 days | Status: CP | PRN
Start: 2023-03-23 — End: 2023-03-30

## 2023-03-23 MED ORDER — TAMSULOSIN 0.4 MG CAPSULE
ORAL_CAPSULE | Freq: Every day | ORAL | 0 refills | 14 days | Status: CP
Start: 2023-03-23 — End: 2023-04-06

## 2023-03-23 MED ADMIN — iohexol (OMNIPAQUE) 350 mg iodine/mL solution 100 mL: 100 mL | INTRAVENOUS | @ 22:00:00 | Stop: 2023-03-23

## 2023-03-23 MED ADMIN — ondansetron (ZOFRAN) injection 4 mg: 4 mg | INTRAVENOUS | @ 22:00:00 | Stop: 2023-03-23

## 2023-03-23 MED ADMIN — ketorolac (TORADOL) injection 15 mg: 15 mg | INTRAVENOUS | @ 22:00:00 | Stop: 2023-03-23

## 2023-03-23 NOTE — Unmapped (Signed)
Pt arrives with c/o ongoing RLQ pain and nausea.  Pt states she has been here 3 times recently for similar issues.  She states RLQ pain is worse.    Pt does not want whatever you gave me last time for nausea because it made her feel paranoid.

## 2023-03-23 NOTE — Unmapped (Signed)
Bed: 16  Expected date: 03/23/23  Expected time: 4:25 PM  Means of arrival:   Comments:  Ems

## 2023-03-23 NOTE — Unmapped (Signed)
[facility] Emergency Department Provider Note      ED Clinical Impression     Final diagnoses:   Nephrolithiasis (Primary)   Renal colic on right side       HPI, ED Course, Assessment and Plan     Initial Clinical Impression:    March 23, 2023 4:43 PM   Brandi Flynn is a 22 y.o. female with past medical history of MS presenting with emesis. The patient reports 20 days of intermittent emesis and diarrhea as well as 1 week of RLQ pain. Additionally, she endorses increased fatigue, low grade fever this morning (Tmax 103F), cough and congestion. Last episode of emesis 30 min pta. Emesis 1-2 times per day. Zofran helped initially with nausea. She was diagnosed with UTI 3 days ago and reports compliance with Keflex.  Never had similar symptoms in the past. No history of abdominal surgeries. Family history of appendicitis. Of note, she initially believed her symptoms were a side effect of a recent MS medication called teriflunomide on 03/12/23, as symptom onset began shortly after starting this medication. Denies dysuria, hematuria or increased urinary frequency.    Per chart review, the patient has been seen in the ED twice in the past week for similar complaints. On 03/17/23 she was complaining of nausea, emesis, diarrhea, increased urinary frequency, hematuria and mild lower abdominal pain. Her labwork was unremarkable, and she was discharged home. She was then seen on 03/20/23 for a couple days of RUQ pain as well as 2 weeks of emesis and diarrhea. Her workup was suggestive of UTI and she was discharged home with course of Keflex.    BP 137/88  - Pulse 83  - Temp 36.9 ??C (98.5 ??F) (Oral)  - Resp 16  - SpO2 96%     Well appearing, NAD. Vitals are hemodynamically stable. Afebrile. Abdomen soft, non-distended with tenderness to palpation in the RLQ and right flank. No CVA tenderness. No rebound or guarding. Given the broad differential of abdominal pain, will obtain basic labs.  Lipase to evaluate for pancreatitis.  CBC to evaluate for leukocytosis or anemia.  Chemistry to evaluate for electrolyte abnormalities, acidosis or alkalosis, and liver function test to evaluate for hepato-biliary disease.  Urinalysis to evaluate for cystitis, pyelonephritis, hematuria suggest renal colic. Will obtain CTAP to evaluate for appendicitis, nephrolithiasis, pyelonephritis.     Further ED updates and updates to plan as per ED Course below:    ED Course:  ED Course as of 03/23/23 1913   Wed Mar 23, 2023   1754 CBC with leukocytosis of 16.2.  UA shows small leuk esterase without bacteria but does have a large blood and RBCs greater than 182 with WBCs of 26.  However, in the setting of no bacteria, suspect WBCs are more related due to large amount of blood as well as contamination with 13 squamous epithelium.  Suspect most likely nephrolithiasis.   1825 On independent review of CT abdomen and pelvis patient noted to have a 4.6 mm kidney stone in the right ureter.   1838 CT Abdomen Pelvis W IV Contrast Only  IMPRESSION:  6 x 4 mm proximal to mid right ureteral calculus with mild resultant right hydronephrosis.     1841 CBC likely reactionary with WBC of 16.2.  UA with lower suspicion for UTI.  Plan to discharge patient home with urology follow-up with prescriptions for Flomax, Toradol, and continued use of Zofran as needed for vomiting. Discussed strict return precautions and close follow-up with  PCP. Patient expressed understanding and is agreeable with the discharge plan.        Social Determinants of Health with Concerns     Internet Connectivity: Not on file   Tobacco Use: High Risk (03/15/2023)    Patient History     Smoking Tobacco Use: Every Day     Smokeless Tobacco Use: Never     Passive Exposure: Not on file   Alcohol Use: Not on file   Substance Use: Not on file   Health Literacy: Medium Risk (01/14/2023)    Health Literacy     : Sometimes   Physical Activity: Not on file   Interpersonal Safety: Not on file   Stress: Not on file   Intimate Partner Violence: Not on file   Social Connections: Not on file     _____________________________________________________________________    The case was discussed with the attending physician who is in agreement with the above assessment and plan    Additional Medical Decision Making     I have reviewed the vital signs and the nursing notes. Labs and radiology results that were available during my care of the patient were independently reviewed by me and considered in my medical decision making.   I independently visualized the EKG tracing if performed  I independently visualized the radiology images if performed  I reviewed the patient's prior medical records if available.  Additional history obtained from family if available.  For specific reads/information impacting care please refer to MDM/ED Course continued documentation    Past History     PAST MEDICAL HISTORY/PAST SURGICAL HISTORY:   Past Medical History:   Diagnosis Date    Disease of thyroid gland     Multiple sclerosis (CMS-HCC)     Obesity     Vertigo        No past surgical history on file.    MEDICATIONS:   No current facility-administered medications for this encounter.    Current Outpatient Medications:     baclofen (LIORESAL) 10 MG tablet, Take 1 tablet (10 mg total) by mouth daily. (Patient not taking: Reported on 03/07/2023), Disp: 30 tablet, Rfl: 1    carboxymethylcellulose sodium (REFRESH CELLUVISC) 1 % DpGe, Administer 1 drop to both eyes four (4) times a day., Disp: 1 each, Rfl: 0    cephalexin (KEFLEX) 500 MG capsule, Take 1 capsule (500 mg total) by mouth four (4) times a day for 5 days., Disp: 20 capsule, Rfl: 0    cetirizine (ZYRTEC) 10 MG tablet, Take 1 tablet (10 mg total) by mouth daily as needed for allergies., Disp: 30 tablet, Rfl: 0    doxycycline (VIBRAMYCIN) 100 MG capsule, Take 1 capsule (100 mg total) by mouth two (2) times a day for 10 days., Disp: 20 capsule, Rfl: 0    fluconazole (DIFLUCAN) 150 MG tablet, Take 1 tablet (150 mg total) by mouth once a week for 6 doses., Disp: 6 tablet, Rfl: 0    loperamide (IMODIUM A-D) 2 mg tablet, Take 1 tablet (2 mg total) by mouth four (4) times a day as needed for diarrhea., Disp: 30 tablet, Rfl: 0    nystatin (MYCOSTATIN) 100,000 unit/gram powder, Apply 1 Application topically two (2) times a day., Disp: 60 g, Rfl: 1    ondansetron (ZOFRAN-ODT) 8 MG disintegrating tablet, Dissolve 1 tablet (8 mg total) in the mouth every eight (8) hours as needed for nausea., Disp: 90 tablet, Rfl: 0    tamsulosin (FLOMAX) 0.4 mg capsule, Take 1 capsule (  0.4 mg total) by mouth daily., Disp: 90 capsule, Rfl: 3    teriflunomide 14 mg Tab, Take 1 tablet (14 mg total) by mouth in the morning., Disp: 30 tablet, Rfl: 5    ALLERGIES:   Blueberry and Peanut    SOCIAL HISTORY:   Social History     Tobacco Use    Smoking status: Every Day     Types: e-Cigarettes    Smokeless tobacco: Never    Tobacco comments:     Vape   Substance Use Topics    Alcohol use: Not Currently       FAMILY HISTORY:  Family History   Problem Relation Age of Onset    Thyroid disease Mother     Thyroid disease Maternal Grandmother     Thyroid disease Maternal Grandfather     Thyroid disease Paternal Grandmother     Vitiligo Paternal Grandmother           Review of Systems     A review of systems was performed and relevant portions were as noted above in HPI     Physical Exam     VITAL SIGNS:    BP 137/88  - Pulse 83  - Temp 36.9 ??C (98.5 ??F) (Oral)  - Resp 16  - SpO2 96%     Constitutional:   Alert and oriented.   Head:   Normocephalic and atraumatic  Eyes:   Conjunctivae are normal, EOMI, PERRL  ENT:   No notable congestion, Mucous membranes moist, External ears normal, no notable stridor  Cardiovascular:   Rate as vitals above. Appears warm and well perfused  Respiratory:   Normal respiratory effort. Breath sounds are normal.  Gastrointestinal:   As above  Musculoskeletal:    Normal range of motion in all extremities. No tenderness or edema noted in B/L lower extremities  Neurologic:   No gross focal neurologic deficits beyond baseline are appreciated.  Skin:   Skin is warm, dry and intact.       Radiology     CT Abdomen Pelvis W IV Contrast Only   Final Result   6 x 4 mm proximal to mid right ureteral calculus with mild resultant right hydronephrosis.      Hepatic steatosis.             Labs     Labs Reviewed   COMPREHENSIVE METABOLIC PANEL - Abnormal; Notable for the following components:       Result Value    Chloride 109 (*)     BUN 8 (*)     AST 36 (*)     ALT 51 (*)     All other components within normal limits   CBC W/ AUTO DIFF - Abnormal; Notable for the following components:    WBC 16.2 (*)     Absolute Neutrophils 13.0 (*)     Absolute Monocytes 1.1 (*)     All other components within normal limits   URINALYSIS WITH MICROSCOPY WITH CULTURE REFLEX PERFORMABLE - Abnormal; Notable for the following components:    Specific Gravity, UA 1.032 (*)     Leukocyte Esterase, UA Small (*)     Protein, UA 70 mg/dL (*)     Ketones, UA Trace (*)     Blood, UA Large (*)     RBC, UA >182 (*)     WBC, UA 26 (*)     Squam Epithel, UA 13 (*)     Mucus, UA Moderate (*)  All other components within normal limits   LIPASE - Normal   PREGNANCY, URINE - Normal   URINE CULTURE   CBC W/ DIFFERENTIAL    Narrative:     The following orders were created for panel order CBC w/ Differential.  Procedure                               Abnormality         Status                     ---------                               -----------         ------                     CBC w/ Differential[[id number]]         Abnormal            Final result                 Please view results for these tests on the individual orders.   URINALYSIS WITH MICROSCOPY WITH CULTURE REFLEX    Narrative:     The following orders were created for panel order Urinalysis with Microscopy with Culture Reflex.  Procedure                               Abnormality         Status ---------                               -----------         ------                     Urinalysis with Microsc.Marland KitchenMarland Kitchen[[id number]]  Abnormal            Final result                 Please view results for these tests on the individual orders.   HCG QUANTITATIVE, BLOOD    Narrative:     Non-Pregnant Females: 1.5 - 4.2 mIU/mL    Post and Peri-menopausal Females: 1.8 - 10.1 mIU/mL    During pregnancy, hCG increases exponentially for about 8-10 weeks after conception and begins falling at about 12 weeks. Week-to-week hCG levels during pregnancy show significant overlap and are not a reliable means of determining gestational age. Post and peri-menopausal females may have low levels of detectable hCG due to pituitary production of hCG; in such cases correlation with serum FSH may be helpful. Test interference may occur from heterophilic antibodies or high levels of biotin.             Pertinent labs & imaging results that were available during my care of the patient were reviewed by me and considered in my medical decision making (see chart for details).    Please note- This chart has been created using [employer]. Chart creation errors have been sought, but may not always be located and such creation errors, especially pronoun confusion, do NOT reflect on the standard of medical care.    Documentation assistance was provided by Kela Millin, Scribe, on March 23, 2023 at 4:43 PM for Magda Bernheim, MD    Documentation assistance provided by the above mentioned scribe. I was present during the time the encounter was recorded. The information recorded by the scribe was done at my direction and has been reviewed and validated by me.   Magda Bernheim, MD        Magda Bernheim, MD  Resident  03/23/23 [phone]

## 2023-03-25 ENCOUNTER — Ambulatory Visit: Admit: 2023-03-25 | Discharge: 2023-03-26 | Payer: PRIVATE HEALTH INSURANCE

## 2023-03-25 ENCOUNTER — Encounter: Admit: 2023-03-25 | Discharge: 2023-03-26 | Payer: PRIVATE HEALTH INSURANCE

## 2023-03-25 DIAGNOSIS — N200 Calculus of kidney: Principal | ICD-10-CM

## 2023-03-25 LAB — COMPREHENSIVE METABOLIC PANEL
ALBUMIN: 3.8 g/dL (ref 3.4–5.0)
ALKALINE PHOSPHATASE: 58 U/L (ref 46–116)
ALT (SGPT): 33 U/L (ref 10–49)
ANION GAP: 7 mmol/L (ref 5–14)
AST (SGOT): 22 U/L (ref <=34)
BILIRUBIN TOTAL: 0.6 mg/dL (ref 0.3–1.2)
BLOOD UREA NITROGEN: 10 mg/dL (ref 9–23)
BUN / CREAT RATIO: 10
CALCIUM: 9.4 mg/dL (ref 8.7–10.4)
CHLORIDE: 108 mmol/L — ABNORMAL HIGH (ref 98–107)
CO2: 27.3 mmol/L (ref 20.0–31.0)
CREATININE: 1.04 mg/dL — ABNORMAL HIGH
EGFR CKD-EPI (2021) FEMALE: 79 mL/min/{1.73_m2} (ref >=60)
GLUCOSE RANDOM: 92 mg/dL (ref 70–179)
POTASSIUM: 3.8 mmol/L (ref 3.4–4.8)
PROTEIN TOTAL: 7.2 g/dL (ref 5.7–8.2)
SODIUM: 142 mmol/L (ref 135–145)

## 2023-03-25 LAB — CBC W/ AUTO DIFF
BASOPHILS ABSOLUTE COUNT: 0.1 10*9/L (ref 0.0–0.1)
BASOPHILS RELATIVE PERCENT: 0.7 %
EOSINOPHILS ABSOLUTE COUNT: 0.3 10*9/L (ref 0.0–0.5)
EOSINOPHILS RELATIVE PERCENT: 2.4 %
HEMATOCRIT: 41.9 % (ref 34.0–44.0)
HEMOGLOBIN: 14.1 g/dL (ref 11.3–14.9)
LYMPHOCYTES ABSOLUTE COUNT: 2 10*9/L (ref 1.1–3.6)
LYMPHOCYTES RELATIVE PERCENT: 17.5 %
MEAN CORPUSCULAR HEMOGLOBIN CONC: 33.7 g/dL (ref 32.3–35.0)
MEAN CORPUSCULAR HEMOGLOBIN: 29.6 pg (ref 25.9–32.4)
MEAN CORPUSCULAR VOLUME: 87.8 fL (ref 77.6–95.7)
MEAN PLATELET VOLUME: 8.9 fL (ref 7.3–10.7)
MONOCYTES ABSOLUTE COUNT: 1 10*9/L — ABNORMAL HIGH (ref 0.3–0.8)
MONOCYTES RELATIVE PERCENT: 8.5 %
NEUTROPHILS ABSOLUTE COUNT: 8.1 10*9/L — ABNORMAL HIGH (ref 1.5–6.4)
NEUTROPHILS RELATIVE PERCENT: 70.9 %
NUCLEATED RED BLOOD CELLS: 0 /100{WBCs} (ref <=4)
PLATELET COUNT: 229 10*9/L (ref 170–380)
RED BLOOD CELL COUNT: 4.78 10*12/L (ref 3.95–5.13)
RED CELL DISTRIBUTION WIDTH: 13.3 % (ref 12.2–15.2)
WBC ADJUSTED: 11.5 10*9/L — ABNORMAL HIGH (ref 4.2–10.2)

## 2023-03-25 LAB — URINALYSIS WITH MICROSCOPY WITH CULTURE REFLEX PERFORMABLE
BACTERIA: NONE SEEN /HPF
BACTERIA: NONE SEEN /HPF
BILIRUBIN UA: NEGATIVE
BILIRUBIN UA: NEGATIVE
BILIRUBIN UA: NEGATIVE
GLUCOSE UA: NEGATIVE
GLUCOSE UA: NEGATIVE
GLUCOSE UA: NEGATIVE
HYALINE CASTS: 2 /LPF — ABNORMAL HIGH (ref 0–1)
KETONES UA: NEGATIVE
KETONES UA: NEGATIVE
KETONES UA: 20 — AB
NITRITE UA: NEGATIVE
NITRITE UA: NEGATIVE
NITRITE UA: NEGATIVE
PH UA: 6 (ref 5.0–9.0)
PH UA: 6.5 (ref 5.0–9.0)
PH UA: 6 (ref 5.0–9.0)
PROTEIN UA: 30 — AB
RBC UA: 14 /HPF — ABNORMAL HIGH (ref <=4)
RBC UA: 16 /HPF — ABNORMAL HIGH (ref <=4)
RBC UA: 12 /HPF — ABNORMAL HIGH (ref <=4)
SPECIFIC GRAVITY UA: 1.031 — ABNORMAL HIGH (ref 1.003–1.030)
SPECIFIC GRAVITY UA: 1.035 — ABNORMAL HIGH (ref 1.003–1.030)
SPECIFIC GRAVITY UA: 1.034 — ABNORMAL HIGH (ref 1.003–1.030)
SQUAMOUS EPITHELIAL: 30 /HPF — ABNORMAL HIGH (ref 0–5)
SQUAMOUS EPITHELIAL: 19 /HPF — ABNORMAL HIGH (ref 0–5)
SQUAMOUS EPITHELIAL: 12 /HPF — ABNORMAL HIGH (ref 0–5)
UROBILINOGEN UA: <2
UROBILINOGEN UA: <2
UROBILINOGEN UA: <2
WBC UA: 56 /HPF — ABNORMAL HIGH (ref 0–5)
WBC UA: 51 /HPF — ABNORMAL HIGH (ref 0–5)
WBC UA: 20 /HPF — ABNORMAL HIGH (ref 0–5)

## 2023-03-25 LAB — PREGNANCY, URINE: PREGNANCY TEST URINE: NEGATIVE

## 2023-03-25 MED ADMIN — ondansetron (ZOFRAN) injection 4 mg: 4 mg | INTRAVENOUS | @ 20:00:00 | Stop: 2023-03-25

## 2023-03-25 MED ADMIN — acetaminophen (TYLENOL) tablet 975 mg: 975 mg | ORAL | @ 23:00:00

## 2023-03-25 MED ADMIN — ketorolac (TORADOL) injection 15 mg: 15 mg | INTRAVENOUS | @ 20:00:00 | Stop: 2023-03-25

## 2023-03-25 MED ADMIN — tamsulosin (FLOMAX) 24 hr capsule 0.4 mg: .4 mg | ORAL | @ 23:00:00

## 2023-03-25 MED ADMIN — enoxaparin (LOVENOX) syringe 40 mg: 40 mg | SUBCUTANEOUS | @ 23:00:00

## 2023-03-25 MED ADMIN — dextrose 5 % and sodium chloride 0.45 % infusion: 100 mL/h | INTRAVENOUS | @ 23:00:00

## 2023-03-25 MED ADMIN — sodium chloride 0.9% (NS) bolus 1,000 mL: 1000 mL | INTRAVENOUS | Stop: 2023-03-25

## 2023-03-25 MED ADMIN — sodium chloride 0.9% (NS) bolus 1,000 mL: 1000 mL | INTRAVENOUS | @ 20:00:00 | Stop: 2023-03-25

## 2023-03-25 NOTE — Unmapped (Signed)
Pt states she was seen here 2 days for kidney stones, did follow up with PCP today and told to come to the ED because she is not able to eat or drink or take the medications due to vomiting

## 2023-03-25 NOTE — Unmapped (Signed)
[facility] Visit      Assessment/Plan:      [facility] was seen today for hospitalization follow-up.    Diagnoses and all orders for this visit:    Kidney stones  Assessment & Plan:  - Patient was advised to go to ER for IV hydration and pain management    Orders:  -     Urinalysis with Microscopy with Culture Reflex  -     POCT urinalysis dipstick  -     Cancel: CBC w/ Differential; Future  -     Cancel: Comprehensive Metabolic Panel; Future             No follow-ups on file.     Subjective:     HPI:    Brandi Flynn is is a 22 year old female patient who came to the clinic to follow-up on her recent ED visit.     On 03-23-2023 patient went to ED with complaints of right upper quadrant abdominal pain, frequency and burning with urination, nausea and vomiting.   Patient was diagnosed with kidney stone in her  right ureteral.  Patient was discharged home on Flomax, Toradol, Zofran and Keflex.     Today patient reports that her  abdominal pain is much worse, rates her pain as 10 out of 10 in intensity.  Pain is constant.  Patient cannot take Toradol as prescribed because of her symptoms of nausea and vomiting.  Patient is unable to keep fluids and solids for the last 48 hours.  Patient feels exhausted and dehydrated.  Reports that Zofran does not control nausea symptoms.  Patient vomited several times yesterday, and once today.  Patient is afraid to eat.   Reports that has not tried Flomax.  Unable to take Keflex due to nausea.     Urinary symptoms-reports increased frequency and dysuria symptoms.  UA was was collected in the clinic today-shows trace of blood, protein and moderate amount of leukocytes.     Patient has a family history of kidney stones-reports that her mother had a hard time to pass kidney stones and had to have surgery in the past for kidney stone removal.   Patient was advised to go to ER for readmission.  Patient needs IV hydration and pain management. Review of systems negative unless otherwise noted as per HPI.    Objective:     Visit Vitals  BP 131/86   Pulse 91   Temp 35.8 ??C (96.4 ??F) (Temporal)   Ht 160 cm (5' 2.99)   Wt (!) 137.9 kg (304 lb)   SpO2 98%   BMI 53.86 kg/m??       PHYSICAL EXAM:    Physical Exam  Vitals reviewed.   Constitutional:       Appearance: She is toxic-appearing.   HENT:      Head: Normocephalic and atraumatic.   Cardiovascular:      Rate and Rhythm: Normal rate and regular rhythm.   Abdominal:      General: There is no distension.      Tenderness: There is abdominal tenderness. There is right CVA tenderness.   Skin:     General: Skin is warm and dry.      Findings: No erythema or rash.   Neurological:      General: No focal deficit present.      Mental Status: She is alert and oriented to person, place, and time.   Psychiatric:  Mood and Affect: Mood normal.         Behavior: Behavior normal.         Thought Content: Thought content normal.         Judgment: Judgment normal.           [name], FNP  Note - This record has been created using Animal nutritionist. Chart creation errors have been sought, but may not always have been located. Such creation errors do not reflect on the standard of medical care.

## 2023-03-25 NOTE — Unmapped (Signed)
ULTRASOUND PIV PROCEDURE NOTE    Indications:   Poor venous access.    Ultrasound guidance was necessary to obtain access.     Procedure Details:  Identity of the patient was confirmed via name, medical record number and date of birth. The availability of the correct equipment was verified.    The vein was identified and measured for ultrasound catheter insertion.       Vein measurement (without tourniquet):   0.36 cm   A(n) 20 gauge 1.75 catheter was selected based on the recommendations below:    [facility] Catheter/Vein Ratio Guidelines    Chart to determine PIV catheter size/length to use based on vein diameter and depth   Catheter Gauge Size (g)  22g 20g 18g   Catheter length (inches)  1.75 1.75 1.75   Catheter diameter measurement (mm) 0.9 mm 1.1 mm 1.3 mm          Minimum required vein diameter       Sonosite (cm)  0.27 cm 0.33 cm 0.39 cm          Maximum vein depth  1.25 cm 1.25 cm 1.25 cm          The field was prepared with necessary supplies and equipment.  Probe cover and sterile gel utilized. Insertion site was prepped with chlorhexidineand allowed to dry.  The catheter extension was primed with normal saline.  The [place] PIV was placed in the R Forearm with 1attempt(s). See LDA for additional details.    Catheter aspirated, 5 mL blood return present. The catheter was then flushed with 10 mL of normal saline. Insertion site cleansed, and dressing applied per manufacturer guidelines. The catheter was inserted with difficulty due to poor vasculature by Jennye Boroughs, RN.    Thank you,     Jennye Boroughs, RN    Ultrasound Resource Nurse    Workup / Procedure Time:  30 minutes

## 2023-03-25 NOTE — Unmapped (Signed)
 [facility] Emergency Department Provider Note         ED Clinical Impression     Final diagnoses:   Nephrolithiasis (Primary)       Presenting History and MDM     HPI    March 25, 2023 2:52 PM   Brandi Flynn is a 22 y.o. female with history of MS presenting with emesis. The patient reports worsening RLQ abdominal pain with associated non-bloody emesis and low grade fever this morning (Tmax 99.3 F). She also endorses having difficulty with PO intake and having minimal bowel movements due to not eating. Of note, she was last seen in this ED on 03/23/2023 where her workup was suggestive for a kidney stone in her right ureteral and was discharged with Flomax , Toradol , and Zoran however has not been able to take these medications due to her emesis. She also visited her PCP earlier today who advised her to come to the ED for IV hydration, pain management, and further evaluation. She denies vaginal bleeding or abnormal vaginal discharge.    Physical Exam     BP 132/92  - Pulse 83  - Temp 36.6 ??C (97.9 ??F) (Oral)  - Resp 16  - SpO2 99%     General: Awake and alert, in no distress.  Skin: Skin is warm and dry. No rashes.  Lungs: Normal respiratory effort. Breath sounds clear bilaterally.  Heart: Regular rhythm, normal heart sounds.  Abdomen: Soft and non-tender to palpation. No bilateral CVA tenderness.  Musculoskeletal: No deformities or tenderness.  Extremities: No peripheral edema.  Neurological: No gross focal neurologic deficits are appreciated.  Psychiatric: Normal affect and behavior for situation        MDM: Brandi Flynn is 22 y.o. female presenting with flank pain and known nephrolithiasis. Considered development of pyelonephritis, no CVA tenderness.  Considered PID, ovarian torsion however believe known nephrolithiasis is much more likely cause.  Considering ectopic pregnancy and will get urine pregnancy test.    Diagnostic workup as below.     Orders Placed This Encounter   Procedures Urine Culture    CBC w/ Differential    Comprehensive Metabolic Panel    Urinalysis with Microscopy with Culture Reflex    Pregnancy Qualitative, Urine    ECG 12 Lead       Patient Treated with:  Medications   metoclopramide  (REGLAN ) injection 10 mg (has no administration in time range)   ketorolac  (TORADOL ) injection 15 mg (has no administration in time range)         Will reassess as we get results and update below    ED Course as of 03/25/23 1703   Fri Mar 25, 2023   1620 Spoke to Urology, they will assess the patient get back to me about whether to admit here at [facility] or at main campus.   1642 Urinalysis with Microscopy with Culture Reflex(!):    Color, UA Yellow   Clarity, UA Turbid   Spec Grav, UA 1.035(!)   pH, UA 6.5   Leukocyte Esterase, UA Large(!)   Nitrite, UA Negative   Protein, UA 30 mg/dL(!)   Glucose, UA Negative   Ketones, UA Negative   Urobilinogen, UA <2.0 mg/dL   Bilirubin, UA Negative   Blood, UA Small(!)   RBC, UA 16(!)   WBC, UA 51(!)   Squam Epithel, UA 19(!)   Bacteria, UA Rare(!)   Mucus, UA Many(!)  Concerning for urinary tract infection, this is a contaminated though, so  we will get repeat urine.   1645 ECG 12 Lead  My independent interpretation:     Normal rate, sinus rhythm  Normal axis  QRS, PR, QTc intervals are all within normal limits  No ST segment elevations or depressions     1646 Spoke to urology request transfer to [facility] campus, will give flomax , will strain all urine, we are giving fluids,     Patient admitted to [facility] main campus.            Discussion of Management with other Physicians, QHP, or Appropriate Source: See ED course above for documentation of pages sent, and discussions with collaborating professionals.  Independent Interpretation of Studies: See ED Course above for my independent reviews.   External Records Reviewed: 03/25/2023 Family Med Office Visit Note & 03/23/2023 ED Note - reviewed past medical history  Escalation of Care, Consideration of Admission/Observation/Transfer: Escalation of Care, Consideration of Admission/Observation/Transfer: admit  See ED course above for documentation of pages sent, discussions with other  Social determinants that significantly affected care: In addition to as noted previously above: None  Prescription drug(s) considered but not prescribed: In addition to as noted previously above: None  Diagnostic tests considered but not performed: In addition to as noted previously above: CT ab/pelvis, deferred due to recent imaging.              _____________________________________________________________________    The case was discussed with attending physician who is in agreement with the above assessment and plan    Additional Medical Decision Making     I have reviewed the vital signs and the nursing notes. Labs and radiology results that were available during my care of the patient were independently reviewed by me and considered in my medical decision making.   I independently visualized the EKG tracing if performed  I independently visualized the radiology images if performed  I reviewed the patient's prior medical records if available.  Additional history obtained from family if available.    If patient prefers a language other than English, I have used an interpreter or interpreting service for our interactions unless directed otherwise by the patient.    Other History     CHIEF COMPLAINT:   Chief Complaint   Patient presents with    Emesis       PAST MEDICAL HISTORY/PAST SURGICAL HISTORY:   Past Medical History:   Diagnosis Date    Disease of thyroid gland     Multiple sclerosis (CMS-HCC)     Obesity     Vertigo        No past surgical history on file.    MEDICATIONS:     Current Facility-Administered Medications:     ketorolac  (TORADOL ) injection 15 mg, 15 mg, Intravenous, Once, Bussey-Spencer, Auden Wettstein B, MD    metoclopramide  (REGLAN ) injection 10 mg, 10 mg, Intravenous, Once, Bussey-Spencer, Domanick Cuccia B, MD    Current Outpatient Medications:     baclofen  (LIORESAL ) 10 MG tablet, Take 1 tablet (10 mg total) by mouth daily. (Patient not taking: Reported on 03/25/2023), Disp: 30 tablet, Rfl: 1    carboxymethylcellulose sodium (REFRESH CELLUVISC) 1 % DpGe, Administer 1 drop to both eyes four (4) times a day., Disp: 1 each, Rfl: 0    cephalexin  (KEFLEX ) 500 MG capsule, Take 1 capsule (500 mg total) by mouth four (4) times a day for 5 days., Disp: 20 capsule, Rfl: 0    cetirizine  (ZYRTEC ) 10 MG tablet, Take 1 tablet (10 mg total) by mouth  daily as needed for allergies., Disp: 30 tablet, Rfl: 0    doxycycline  (VIBRAMYCIN ) 100 MG capsule, Take 1 capsule (100 mg total) by mouth two (2) times a day for 10 days., Disp: 20 capsule, Rfl: 0    fluconazole  (DIFLUCAN ) 150 MG tablet, Take 1 tablet (150 mg total) by mouth once a week for 6 doses., Disp: 6 tablet, Rfl: 0    ketorolac  (TORADOL ) 10 mg tablet, Take 1 tablet (10 mg total) by mouth every six (6) hours as needed for pain for up to 7 days., Disp: 20 tablet, Rfl: 0    loperamide  (IMODIUM  A-D) 2 mg tablet, Take 1 tablet (2 mg total) by mouth four (4) times a day as needed for diarrhea., Disp: 30 tablet, Rfl: 0    nystatin  (MYCOSTATIN ) 100,000 unit/gram powder, Apply 1 Application topically two (2) times a day., Disp: 60 g, Rfl: 1    ondansetron  (ZOFRAN -ODT) 8 MG disintegrating tablet, Dissolve 1 tablet (8 mg total) in the mouth every eight (8) hours as needed for nausea., Disp: 90 tablet, Rfl: 0    tamsulosin  (FLOMAX ) 0.4 mg capsule, Take 1 capsule (0.4 mg total) by mouth daily for 14 days., Disp: 14 capsule, Rfl: 0    teriflunomide  14 mg Tab, Take 1 tablet (14 mg total) by mouth in the morning., Disp: 30 tablet, Rfl: 5    ALLERGIES:   Blueberry and Peanut    SOCIAL HISTORY:   Social History     Tobacco Use    Smoking status: Every Day     Types: e-Cigarettes    Smokeless tobacco: Never    Tobacco comments:     Vape   Substance Use Topics    Alcohol use: Not Currently       FAMILY HISTORY:  Family History   Problem Relation Age of Onset    Thyroid disease Mother     Thyroid disease Maternal Grandmother     Thyroid disease Maternal Grandfather     Thyroid disease Paternal Grandmother     Vitiligo Paternal Grandmother           Review of Systems    A 10 point review of systems was performed and is negative other than positive elements noted in HPI     Radiology     No orders to display       Labs     Labs Reviewed   URINALYSIS WITH MICROSCOPY WITH CULTURE REFLEX PERFORMABLE - Abnormal; Notable for the following components:       Result Value    Specific Gravity, UA 1.035 (*)     Leukocyte Esterase, UA Large (*)     Protein, UA 30 mg/dL (*)     Blood, UA Small (*)     RBC, UA 16 (*)     WBC, UA 51 (*)     Squam Epithel, UA 19 (*)     Bacteria, UA Rare (*)     Mucus, UA Many (*)     All other components within normal limits   PREGNANCY, URINE - Normal   POCT GLUCOSE, INTERFACED - Normal   URINE CULTURE   URINALYSIS WITH MICROSCOPY WITH CULTURE REFLEX    Narrative:     The following orders were created for panel order Urinalysis with Microscopy with Culture Reflex.                  Procedure  Abnormality         Status                                     ---------                               -----------         ------                                     Urinalysis with Microsc.SABRASABRA[[id number]]  Abnormal            Final result                                                 Please view results for these tests on the individual orders.   CBC W/ DIFFERENTIAL    Narrative:     The following orders were created for panel order CBC w/ Differential.                  Procedure                               Abnormality         Status                                     ---------                               -----------         ------                                     CBC w/ Differential[[id number]] Please view results for these tests on the individual orders.   COMPREHENSIVE METABOLIC PANEL   CBC W/ AUTO DIFF       Please note- This chart has been created using [employer]. Chart creation errors have been sought, but may not always be located and such creation errors, especially pronoun confusion, do NOT reflect on the standard of medical care.    Documentation assistance was provided by Almarie Crawley, Scribe, on March 25, 2023 at 3:01 PM for Vannie Rands, MD.    Documentation assistance was provided by the scribe in my presence.  The documentation recorded by the scribe has been reviewed by me and accurately reflects the services I personally performed.     Rands Vannie NOVAK, MD  Resident  03/27/23 [phone]

## 2023-03-25 NOTE — Unmapped (Signed)
UROLOGY H&P NOTE    Requesting Attending Physician:  Pincus Sanes', MD  Service Requesting Consult:  Emergency Medicine  Service Providing Consult: SRU  Consulting Attending: Dr. Kevin Fenton    Assessment:    Patient is a 22 y.o. female with PMH signfiicant for MS, obesity, vertigo who presents with 2 weeks of RLQ pain, multiple ED visits, N/V and Ct scan showing right ureteral 6 mm nephrolithiasis.     She is HDS, afebrile, no AKI. She is symptomatic and unable to tolerate PO intake over the past 18 hours.     Her UA is grossly dirty, but squamous epithelial cells are numerous, ED currently working on repeat culture.    Indications for acute intervention include infected obstruction, bilateral ureteral obstruction or unilateral obstruction of solitary kidney as well as other less urgent indications for decompression which would included intractable pain, N/V, and acute renal injury.     Low concern for infection and pt is HDS. However, given continued N/V and symptoms for over 2 weeks and multiple health encounters over the past week, reasonable to proceed with right ureteral stent placement. Will plan for MET in the meantime.     Will plan for transfer to [facility] main for stenting on 6/29/202.     Recommendations:  Plan for planned right ureteral stent placement  Case posted; Urology will discuss with anesthesia  Regular diet, will make pt NPO @ [place].  Consent will be obtained in pre-op holding area  No need for urinary catheter or abx at this time  Prior to stent placement, will plan for medical expulsion therapy, including:  1L Bolus  mIVF  Flomax  Straining all urine  Home meds  CBC/BMP monitoring  Repeat UA/UC      Discussed with Dr. Kevin Fenton.  Thank you for this consult. Please page [phone] with any questions or concerns.    History of Present Illness:   Brandi Flynn is seen in consultation for right ureteral stone at the request of Pincus Sanes', MD on the Emergency Medicine. Pt reports RLQ pain and on/off NV for the past two weeks.     Initially seen by ED on 6/23 with concern for UTI.  No improvement in symptoms, seen on 6/26, resulting CT showing right 6 mm ureteral stone.   All UC from previous encounters negative for infection.    Pt reports last being able to eat yesterday morning 2/2 significant pain and N/V.   Subjective fevers, reports 99.3 this AM before coming in.     Pt with MS, but no other significant PMH.   Pt adopted, does endorse biological family history of kidney stones.     Not on blood thinners.     Past Medical History:  Past Medical History:   Diagnosis Date    Disease of thyroid gland     Multiple sclerosis (CMS-HCC)     Obesity     Vertigo        Past Surgical History:   No past surgical history on file.    Medication:  Current Facility-Administered Medications   Medication Dose Route Frequency Provider Last Rate Last Admin    sodium chloride 0.9% (NS) bolus 1,000 mL  1,000 mL Intravenous Once Bussey-Spencer, Walker B, MD 1,000 mL/hr at 03/25/23 1602 1,000 mL at 03/25/23 1602     Current Outpatient Medications   Medication Sig Dispense Refill    baclofen (LIORESAL) 10 MG tablet Take 1 tablet (10 mg total) by mouth daily. (Patient  not taking: Reported on 03/25/2023) 30 tablet 1    carboxymethylcellulose sodium (REFRESH CELLUVISC) 1 % DpGe Administer 1 drop to both eyes four (4) times a day. 1 each 0    cephalexin (KEFLEX) 500 MG capsule Take 1 capsule (500 mg total) by mouth four (4) times a day for 5 days. 20 capsule 0    cetirizine (ZYRTEC) 10 MG tablet Take 1 tablet (10 mg total) by mouth daily as needed for allergies. 30 tablet 0    doxycycline (VIBRAMYCIN) 100 MG capsule Take 1 capsule (100 mg total) by mouth two (2) times a day for 10 days. 20 capsule 0    fluconazole (DIFLUCAN) 150 MG tablet Take 1 tablet (150 mg total) by mouth once a week for 6 doses. 6 tablet 0    ketorolac (TORADOL) 10 mg tablet Take 1 tablet (10 mg total) by mouth every six (6) hours as needed for pain for up to 7 days. 20 tablet 0    loperamide (IMODIUM A-D) 2 mg tablet Take 1 tablet (2 mg total) by mouth four (4) times a day as needed for diarrhea. 30 tablet 0    nystatin (MYCOSTATIN) 100,000 unit/gram powder Apply 1 Application topically two (2) times a day. 60 g 1    ondansetron (ZOFRAN-ODT) 8 MG disintegrating tablet Dissolve 1 tablet (8 mg total) in the mouth every eight (8) hours as needed for nausea. 90 tablet 0    tamsulosin (FLOMAX) 0.4 mg capsule Take 1 capsule (0.4 mg total) by mouth daily for 14 days. 14 capsule 0    teriflunomide 14 mg Tab Take 1 tablet (14 mg total) by mouth in the morning. 30 tablet 5       Allergies:  Allergies   Allergen Reactions    Blueberry Hives    Peanut Swelling     Swelling tongue       Social History:  Social History     Tobacco Use    Smoking status: Every Day     Types: e-Cigarettes    Smokeless tobacco: Never    Tobacco comments:     Vape   Vaping Use    Vaping status: Never Used   Substance Use Topics    Alcohol use: Not Currently    Drug use: Never       Family History:  Family History   Problem Relation Age of Onset    Thyroid disease Mother     Thyroid disease Maternal Grandmother     Thyroid disease Maternal Grandfather     Thyroid disease Paternal Grandmother     Vitiligo Paternal Grandmother        Review of Systems:  10 systems were reviewed and are negative except as noted specifically in the HPI.    Objective:     Intake/Output last 3 shifts:  No intake/output data recorded.  Vital signs in last 24 hours:  BP 129/81  - Pulse 85  - Temp 36.6 ??C (97.9 ??F) (Oral)  - Resp 17  - SpO2 99%     Physical Exam:  General:  No acute distress, well appearing and well nourished  HEENT: Normocephalic, atraumatic, pupils equal and round, sclera anicteric  Neck  Trachea midline, symmetrical  Lungs:   Normal work of breathing on room air  Cardiac: Regular rate  Abdomen: Distended 2/2 body habitus, Non tender, soft, non distended.  GU:  Voiding spontaneously  Extremities: Warm and well perfused  Neuro:  Alert and oriented, strength and sensation grossly normal      Most Recent Labs:  Recent Labs     Units 03/20/23  1844 03/23/23  1726 03/25/23  1602   WBC 10*9/L 11.4* 16.2* 11.5*   RBC 10*12/L 5.06 5.01 4.78   HGB g/dL 04.5 40.9 81.1   HCT % 44.0 43.4 41.9   MCV fL 86.9 86.7 87.8   MCH pg 29.5 29.7 29.6   MCHC g/dL 91.4 78.2 95.6   RDW % 13.4 13.4 13.3   PLT 10*9/L 258 256 229   MPV fL 8.7 8.7 8.9     Recent Labs     Units 03/20/23  1844 03/23/23  1726 03/25/23  1602   NA mmol/L 140 142 142   K mmol/L 4.0 3.8 3.8   CL mmol/L 108* 109* 108*   CO2 mmol/L 25.8 24.6 27.3   BUN mg/dL 6* 8* 10   CREATININE mg/dL 2.13* 0.86 5.78*   GLU mg/dL 85 469 92     Recent Labs     Units 03/20/23  1844 03/23/23  1726 03/25/23  1602   ALT U/L 50* 51* 33   AST U/L 34 36* 22   ALKPHOS U/L 60 60 58   ALBUMIN g/dL 3.8 4.0 3.8   PROT g/dL 7.5 7.4 7.2   BILITOT mg/dL 0.4 0.6 0.6     No results for input(s): INR, APTT in the last 168 hours.    Microbiology Data:  No results found for: LABBLOO  Urine Culture, Comprehensive   Date Value Ref Range Status   03/23/2023 Mixed Urogenital Flora  Final   03/20/2023 Mixed Urogenital Flora  Final   03/17/2023 Mixed Urogenital Flora  Final   02/16/2023 Mixed Urogenital Flora  Final   02/11/2023 Mixed Urogenital Flora  Final   01/25/2023 Mixed Urogenital Flora  Final     No results found for: ANACX  No results found for: Truett [place]  Most recent Urinalysis:  Recent Labs     Units 03/20/23  1847 03/23/23  1726 03/25/23  1153 03/25/23  1156 03/25/23  1340   LEUKOCYTESUR  Small* Small* Trace* Moderate* Large*   NITRITE  Negative Negative Negative Negative Negative   RBCUA /HPF 4 >182*  --  14* 16*   WBCUA /HPF 10* 26*  --  56* 51*   SQUEPIU /HPF 2 13*  --  30* 19*   BACTERIA /HPF Rare* None Seen  --  None Seen Rare*      Urinalysis History:  Leukocyte Esterase, UA   Date Value Ref Range Status   03/25/2023 Large (A) Negative Final   03/25/2023 Moderate (A) Negative Final   03/23/2023 Small (A) Negative Final   03/20/2023 Small (A) Negative Final   03/17/2023 Negative Negative Final   02/16/2023 Moderate (A) Negative Final   02/11/2023 Small (A) Negative Final     Leukocytes, UA   Date Value Ref Range Status   03/25/2023 Trace (A) Negative Final   01/25/2023 Negative Negative Final     Nitrite, UA   Date Value Ref Range Status   03/25/2023 Negative Negative Final   03/25/2023 Negative Negative Final   03/25/2023 Negative Negative Final   03/23/2023 Negative Negative Final   03/20/2023 Negative Negative Final   03/17/2023 Negative Negative Final   02/16/2023 Negative Negative Final   02/11/2023 Negative Negative Final   01/25/2023 Negative Negative Final     RBC, UA   Date Value Ref Range Status  03/25/2023 16 (H) <=4 /HPF Final   03/25/2023 14 (H) <=4 /HPF Final   03/23/2023 >182 (H) <=4 /HPF Final   03/20/2023 4 <=4 /HPF Final   03/17/2023 3 <=4 /HPF Final   02/16/2023 2 <=4 /HPF Final   02/11/2023 6 (H) <=4 /HPF Final     WBC, UA   Date Value Ref Range Status   03/25/2023 51 (H) 0 - 5 /HPF Final   03/25/2023 56 (H) 0 - 5 /HPF Final   03/23/2023 26 (H) 0 - 5 /HPF Final   03/20/2023 10 (H) 0 - 5 /HPF Final   03/17/2023 7 (H) 0 - 5 /HPF Final   02/16/2023 19 (H) 0 - 5 /HPF Final   02/11/2023 11 (H) 0 - 5 /HPF Final     Squam Epithel, UA   Date Value Ref Range Status   03/25/2023 19 (H) 0 - 5 /HPF Final   03/25/2023 30 (H) 0 - 5 /HPF Final   03/23/2023 13 (H) 0 - 5 /HPF Final   03/20/2023 2 0 - 5 /HPF Final   03/17/2023 4 0 - 5 /HPF Final   02/16/2023 4 0 - 5 /HPF Final   02/11/2023 12 (H) 0 - 5 /HPF Final     Bacteria, UA   Date Value Ref Range Status   03/25/2023 Rare (A) None Seen /HPF Final   03/25/2023 None Seen None Seen /HPF Final   03/23/2023 None Seen None Seen /HPF Final   03/20/2023 Rare (A) None Seen /HPF Final   03/17/2023 None Seen None Seen /HPF Final   02/16/2023 Rare (A) None Seen /HPF Final   02/11/2023 Rare (A) None Seen /HPF Final        Imaging:  ECG 12 Lead    Result Date: 03/25/2023  NORMAL SINUS RHYTHM NORMAL ECG WHEN COMPARED WITH ECG OF 30-Dec-2022 14:16, NO SIGNIFICANT CHANGE WAS FOUND Confirmed by Eldred Manges ([id number]) on 03/25/2023 3:34:22 PM    CT Abdomen Pelvis W IV Contrast Only    Result Date: 03/23/2023  EXAM: CT ABDOMEN PELVIS W CONTRAST ACCESSION: [id number] UN CLINICAL INDICATION: 22 years old with RLQ abd pain      COMPARISON: None     TECHNIQUE: A helical CT scan of the abdomen and pelvis was obtained following IV contrast from the lung bases through the pubic symphysis. Images were reconstructed in the axial plane. Coronal and sagittal reformatted images were also provided for further evaluation.         FINDINGS:     LOWER CHEST: Unremarkable.     LIVER: Normal liver contour. Diffuse hepatic steatosis. No focal liver lesions.     BILIARY: The gallbladder is normal in appearance. No biliary ductal dilatation.      SPLEEN: Normal in size and contour.     PANCREAS: Normal pancreatic contour.  No focal lesions.  No ductal dilation.     ADRENAL GLANDS: Normal appearance of the adrenal glands.     KIDNEYS/URETERS: 6 x 4 mm calculus in the proximal to mid right ureter, resulting in mild right hydronephrosis. Left kidney enhances normally.     BLADDER: Underdistended, limiting evaluation.     REPRODUCTIVE ORGANS: Unremarkable.     GI TRACT: No findings of bowel obstruction or acute inflammation. Normal appendix.     PERITONEUM, RETROPERITONEUM AND MESENTERY: No free air.  No ascites.  No fluid collection.     LYMPH NODES: No adenopathy.     VESSELS: Hepatic and portal veins are  patent.  Normal caliber aorta.      BONES and SOFT TISSUES: No aggressive osseous lesions. Tiny fat-containing umbilical hernia.             6 x 4 mm proximal to mid right ureteral calculus with mild resultant right hydronephrosis.     Hepatic steatosis.

## 2023-03-25 NOTE — Unmapped (Signed)
-   Patient was advised to go to ER for IV hydration and pain management

## 2023-03-26 LAB — CBC
HEMATOCRIT: 41 % (ref 34.0–44.0)
HEMOGLOBIN: 13.6 g/dL (ref 11.3–14.9)
MEAN CORPUSCULAR HEMOGLOBIN CONC: 33.3 g/dL (ref 32.3–35.0)
MEAN CORPUSCULAR HEMOGLOBIN: 29 pg (ref 25.9–32.4)
MEAN CORPUSCULAR VOLUME: 87.1 fL (ref 77.6–95.7)
MEAN PLATELET VOLUME: 9.4 fL (ref 7.3–10.7)
PLATELET COUNT: 204 10*9/L (ref 170–380)
RED BLOOD CELL COUNT: 4.71 10*12/L (ref 3.95–5.13)
RED CELL DISTRIBUTION WIDTH: 13.2 % (ref 12.2–15.2)
WBC ADJUSTED: 10.4 10*9/L — ABNORMAL HIGH (ref 4.2–10.2)

## 2023-03-26 LAB — BASIC METABOLIC PANEL
ANION GAP: 7 mmol/L (ref 5–14)
BLOOD UREA NITROGEN: 12 mg/dL (ref 9–23)
BUN / CREAT RATIO: 12
CALCIUM: 9 mg/dL (ref 8.7–10.4)
CHLORIDE: 110 mmol/L — ABNORMAL HIGH (ref 98–107)
CO2: 24 mmol/L (ref 20.0–31.0)
CREATININE: 1.02 mg/dL
EGFR CKD-EPI (2021) FEMALE: 81 mL/min/{1.73_m2} (ref >=60)
GLUCOSE RANDOM: 146 mg/dL (ref 70–179)
POTASSIUM: 4.3 mmol/L (ref 3.4–4.8)
SODIUM: 141 mmol/L (ref 135–145)

## 2023-03-26 LAB — PHOSPHORUS: PHOSPHORUS: 2.2 mg/dL — ABNORMAL LOW (ref 2.4–5.1)

## 2023-03-26 LAB — MAGNESIUM: MAGNESIUM: 1.7 mg/dL (ref 1.6–2.6)

## 2023-03-26 MED ORDER — PHENAZOPYRIDINE 100 MG TABLET
ORAL_TABLET | Freq: Three times a day (TID) | ORAL | 0 refills | 2 days | Status: CP | PRN
Start: 2023-03-26 — End: 2023-03-28
  Filled 2023-03-26: qty 12, 2d supply, fill #0

## 2023-03-26 MED ORDER — OXYBUTYNIN CHLORIDE 5 MG TABLET
ORAL_TABLET | Freq: Three times a day (TID) | ORAL | 0 refills | 5 days | Status: CP
Start: 2023-03-26 — End: 2023-03-31
  Filled 2023-03-26: qty 15, 5d supply, fill #0

## 2023-03-26 MED ORDER — ACETAMINOPHEN 500 MG TABLET
ORAL_TABLET | Freq: Three times a day (TID) | ORAL | 0 refills | 7 days | Status: CP
Start: 2023-03-26 — End: 2023-04-02
  Filled 2023-03-26: qty 42, 7d supply, fill #0

## 2023-03-26 MED ORDER — TAMSULOSIN 0.4 MG CAPSULE
ORAL_CAPSULE | Freq: Every day | ORAL | 0 refills | 30.00000 days | Status: CP
Start: 2023-03-26 — End: 2023-04-25

## 2023-03-26 MED ADMIN — ketorolac (TORADOL) injection 15 mg: 15 mg | INTRAVENOUS | @ 07:00:00 | Stop: 2023-03-26

## 2023-03-26 MED ADMIN — succinylcholine (ANECTINE) injection: INTRAVENOUS | @ 12:00:00 | Stop: 2023-03-26

## 2023-03-26 MED ADMIN — oxybutynin (DITROPAN) tablet 5 mg: 5 mg | ORAL | @ 14:00:00 | Stop: 2023-03-26

## 2023-03-26 MED ADMIN — lidocaine (PF) (XYLOCAINE-MPF) 20 mg/mL (2 %) injection: INTRAVENOUS | @ 12:00:00 | Stop: 2023-03-26

## 2023-03-26 MED ADMIN — fentaNYL (PF) (SUBLIMAZE) injection: INTRAVENOUS | @ 12:00:00 | Stop: 2023-03-26

## 2023-03-26 MED ADMIN — ondansetron (ZOFRAN) injection: INTRAVENOUS | @ 12:00:00 | Stop: 2023-03-26

## 2023-03-26 MED ADMIN — ondansetron (ZOFRAN) injection 4 mg: 4 mg | INTRAVENOUS | @ 08:00:00 | Stop: 2023-03-26

## 2023-03-26 MED ADMIN — tamsulosin (FLOMAX) 24 hr capsule 0.4 mg: .4 mg | ORAL | @ 14:00:00 | Stop: 2023-03-26

## 2023-03-26 MED ADMIN — Propofol (DIPRIVAN) injection: INTRAVENOUS | @ 12:00:00 | Stop: 2023-03-26

## 2023-03-26 MED ADMIN — dexmedeTOMIDine (Precedex) 80 mcg/20 mL (4 mcg/mL) injection: INTRAVENOUS | @ 12:00:00 | Stop: 2023-03-26

## 2023-03-26 MED ADMIN — acetaminophen (TYLENOL) tablet 975 mg: 975 mg | ORAL | @ 03:00:00

## 2023-03-26 MED ADMIN — haloperidol LACTATE (HALDOL) injection: INTRAVENOUS | @ 12:00:00 | Stop: 2023-03-26

## 2023-03-26 MED ADMIN — nystatin (MYCOSTATIN) powder 1 Application: 1 | TOPICAL | @ 07:00:00 | Stop: 2023-03-26

## 2023-03-26 MED ADMIN — dexAMETHasone (DECADRON) 4 mg/mL injection: INTRAVENOUS | @ 12:00:00 | Stop: 2023-03-26

## 2023-03-26 MED ADMIN — ceFAZolin (ANCEF) injection: INTRAVENOUS | @ 12:00:00 | Stop: 2023-03-26

## 2023-03-26 MED ADMIN — iohexol (OMNIPAQUE) 300 mg iodine/mL solution: URETHRAL | @ 12:00:00 | Stop: 2023-03-26

## 2023-03-26 MED ADMIN — lactated Ringers infusion: INTRAVENOUS | @ 12:00:00 | Stop: 2023-03-26

## 2023-03-26 NOTE — Unmapped (Signed)
[facility] Discharge Summary    Admit date: 03/25/2023    Discharge date and time: 03/26/23      Discharge to:  Home    Discharge Service: Surg Urology (SRU)    Discharge Attending Physician: Annye English, MD    Discharge  Diagnoses: Right 6mm Mid ureteral stone    OR Procedures: Right - CYSTOURETHROSCOPY,  WITH INSERTION OF INDWELLING URETERAL STENT (EG, GIBBONS OR DOUBLE-J TYPE)  Right - CYSTOURETHROSCOPY, W/URETERAL CATHETERIZATION, W/WO IRRIG, INSTILL, OR URETEROPYELOG, EXCLUS OF RADIOLG SVC 03/26/2023     Ancillary Procedures: no procedures    Condition at Discharge: good  Patient is back to their functional baseline and is self-managing all activites of daily living.     Discharge Day Services:  The patient was seen and examined by the Urology team both in the morning and immediately prior to discharge.  Vital signs and laboratory values were stable and within normal limits.  The physical exam was benign and unchanged and all surgical wounds were examined.  Discharge instructions were explained and all questions answered.    Subjective   Pain Controlled. No fever or chills.    Objective  Patient Vitals for the past 8 hrs:   BP Temp Temp src Pulse SpO2 Pulse Resp SpO2 Height   03/26/23 0955 134/89 36.6 ??C (97.9 ??F) Oral 80 -- -- 94 % --   03/26/23 0939 -- -- -- -- -- -- -- 160 cm (5' 2.99)   03/26/23 0845 130/97 -- -- 92 84 25 94 % --   03/26/23 0838 134/85 36.2 ??C (97.2 ??F) Temporal 77 77 19 96 % --   03/26/23 0830 135/89 -- -- 81 81 18 93 % --   03/26/23 0818 -- 36.3 ??C (97.3 ??F) Temporal 93 92 16 92 % --   03/26/23 0816 152/135 -- -- -- -- -- -- --   03/26/23 0630 140/97 36.4 ??C (97.5 ??F) Temporal 72 -- 18 100 % --   03/26/23 0416 129/79 36.8 ??C (98.2 ??F) Oral 82 -- 18 98 % --     I/O this shift:  In: 570 [P.O.:370; I.V.:200]  Out: 5 [Blood:5]    General:  No acute distress  Lungs:   Normal work of breathing  Cardiac: Regular rate  Abdomen: Soft, non tender, non distended  GU:  Voiding spontaneously  Extremities: Warm and well perfused  Neuro:             Alert and oriented, strength and sensation grossly normal        Hospital Course:   HPI:  The patient was transferred from [place] with a CT confirmed Right 6mm Mid ureteral stone.  They underwent Right ureteral stent placement on 03/26/2023.  The patient tolerated the procedure well, was extubated in the OR, and afterwards was taken to the PACU for routine post-surgical care. When stable the patient was transferred back to the floor.   The patient did well postoperatively.  Their diet was slowly advanced and at the time of discharge the patient was tolerating a regular diet.  The patient was discharged home on later that day Day of Surgery, at which point the patient was tolerating a regular solid diet, was able to void spontaneously, have their pain controlled with P.O. pain medication, and able to ambulate without difficulty.      Case has been requested. No antibiotics on discharge              Body mass index is 54.49  kg/m??.                              Condition at Discharge: Improved  Discharge Medications:      Your Medication List        START taking these medications      acetaminophen 500 MG tablet  Commonly known as: TYLENOL EXTRA STRENGTH  Take 2 tablets (1,000 mg total) by mouth every eight (8) hours for 7 days.     oxybutynin 5 MG tablet  Commonly known as: DITROPAN  Take 1 tablet (5 mg total) by mouth Three (3) times a day for 5 days.     phenazopyridine 100 MG tablet  Commonly known as: PYRIDIUM  Take 2 tablets (200 mg total) by mouth Three (3) times a day as needed for pain for up to 2 days.            CHANGE how you take these medications      tamsulosin 0.4 mg capsule  Commonly known as: FLOMAX  Take 1 capsule (0.4 mg total) by mouth daily.  What changed: You were already taking a medication with the same name, and this prescription was added. Make sure you understand how and when to take each.     tamsulosin 0.4 mg capsule  Commonly known as: FLOMAX  Take 1 capsule (0.4 mg total) by mouth daily.  What changed: Another medication with the same name was added. Make sure you understand how and when to take each.            CONTINUE taking these medications      cetirizine 10 MG tablet  Commonly known as: ZYRTEC  Take 1 tablet (10 mg total) by mouth daily as needed for allergies.     fluconazole 150 MG tablet  Commonly known as: DIFLUCAN  Take 1 tablet (150 mg total) by mouth once a week for 6 doses.     ketorolac 10 mg tablet  Commonly known as: TORADOL  Take 1 tablet (10 mg total) by mouth every six (6) hours as needed for pain for up to 7 days.     loperamide 2 mg tablet  Commonly known as: IMODIUM A-D  Take 1 tablet (2 mg total) by mouth four (4) times a day as needed for diarrhea.     nystatin 100,000 unit/gram powder  Commonly known as: MYCOSTATIN  Apply 1 Application topically two (2) times a day.     ondansetron 8 MG disintegrating tablet  Commonly known as: ZOFRAN-ODT  Dissolve 1 tablet (8 mg total) in the mouth every eight (8) hours as needed for nausea.     REFRESH CELLUVISC 1 % Dpge  Generic drug: carboxymethylcellulose sodium  Administer 1 drop to both eyes four (4) times a day.     teriflunomide 14 mg Tab  Take 1 tablet (14 mg total) by mouth in the morning.            ASK your doctor about these medications      baclofen 10 MG tablet  Commonly known as: LIORESAL  Take 1 tablet (10 mg total) by mouth daily.     cephalexin 500 MG capsule  Commonly known as: KEFLEX  Take 1 capsule (500 mg total) by mouth four (4) times a day for 5 days.  Ask about: Should I take this medication?     doxycycline 100 MG capsule  Commonly known as: VIBRAMYCIN  Take  1 capsule (100 mg total) by mouth two (2) times a day for 10 days.  Ask about: Should I take this medication?     ketorolac 10 mg tablet  Commonly known as: TORADOL  Take 1 tablet (10 mg total) by mouth every six (6) hours as needed for pain for up to 7 days.  Ask about: Should I take this medication?              Pending Test Results:     Discharge Instructions:     Appointments which have been scheduled for you      Apr 06, 2023 11:00 AM  (Arrive by 10:45 AM)  RETURN CONTINUITY with Lucille Passy, MD  [facility] FAMILY [facility]IC[facility] [address] [facility] [place]) [address]  [address]  [place] [address]  [phone]        Apr 10, 2023 2:00 PM  (Arrive by 1:45 PM)  CT CHEST ABDOMEN PELVIS W WO CONTRAST with HBR CT RM 1  IMG CT [facility]BR [facility]) [address]  [place] [address]  [phone]   On appt date:  Drink lots of water 24 hrs  Bring recent lab work  Take meds as usual  [employer] fast streamer of current meds  Bring snack if diabetic    Let us know if pt:  Allergic to contrast dyes  Diabetic  Pregnant or nursing  Claustrophobic    (Title:CTWCNTRST)         Apr 27, 2023 1:30 PM  (Arrive by 1:00 PM)  FL LUMBAR PUNCTURE - RADIOLOGY with Onnie Boer RM 7  IMG [facility]) [address]  [place] [address]  [phone]   [place]  Aggrenox/aspirin and extended release dipyridamole - do not stop  Coumadin/warfarin - stop 5 days prior & get order for INR am of procedure  Lovenox/enoxaparin - stop 12 hours prior  Plavix/clopidogrel - stop 5 days prior  Brilinta/ticagrelor - stop 5 days prior  Pletal/cilostazol  - stop 5 days prior  Pradaxa/dabigatran - stop 1-2 days prior (normal renal function vs moderate renal impairment)  Xarelto/ rivaroxaban - stop 1 day before  Eliquis/apixavan - stop 2 days before  Will need someone to drive them home.  Inform of 1 hr. post recovery.  If patient had recent CT/MRI of the head send report with the order. If this is for a methotrexate LP, must get order for methotrexate.         May 10, 2023 10:00 AM  (Arrive by 9:45 AM)  Physical with [name], FNP  [facility] FAMILY MEDICINE [address] [facility]) [address]  [address]  [place] [address]  [phone]   Arrive 15 minutes prior to appointment.         Jun 09, 2023 2:15 PM  (Arrive by 2:00 PM)  NEW GENERAL with Vickii Penna, MD  [facility] UROLOGY [facility]) [address]  [address]  [place] [address]  [phone]        Jun 27, 2023 2:00 PM  (Arrive by 1:45 PM)  RETURN NEUROIMMUNOLOGY with Clara Doreatha Massed, PA  [facility] NEUROLOGY CLINIC [facility]) [address]  [address]  [place] [address]  [phone]                 Follow Up instructions and Outpatient Referrals  Discharge instructions        Other Instructions       Discharge instructions      POST OP DISCHARGE INSTRUCTIONS    Appointment: Stent Placement: You have undergone stent placement, see below for more information on what to expect with a stent. If you have not already been scheduled, you should soon receive a call to schedule a follow-up appointment.     Medications:   Tylenol: You have been prescribed extra-strength Tylenol for pain control. We recommend that you take Tylenol as scheduled (1,000 mg every 6 hours) while awake in the first 3-4 days following discharge. Do not exceed 4,000mg  of Tylenol per day.   Ditropan: Ditropan is taken as needed to help with bladder spasms. If you have a Foley catheter that is scheduled for removal, stop taking Ditropan 2 days prior to your appointment.   Flomax: Take daily to help improve urinary flow.   Pyridium: This medication can be taken for up to 3 days to help with burning with urination. WARNING: It will turn your urine orange. This is normal.     Diet: You may resume eating and drinking whatever you wish. Drink plenty of water.      Activity: Be sure to continue taking frequent walks during the day. There are no specific activity restrictions. You may drive a car 3-4 days after surgery if not taking narcotic pain medication.     Common Post Operative Concerns: It is common to experience the following symptoms after your surgery. Drinking plenty of fluids will help reduce these symptoms.  - Burning with urination.   - Feelings like you must use the bathroom frequently or urgently.    - Blood in the urine. (This is normal if you can still see-through your urine. Call if you notice that the blood in your urine thickens or has clots.)     Tubes, Stents, or Drains: Not applicable.   Stent: You are being discharged home with a ureteral stent. Please see below for instructions regarding what to expect.     DISCHARGING WITH A STENT - WHAT NOW?     WHAT IS A STENT?  A ureteral stent is a flexible, thin hollow tube (like a straw) that helps the kidney drain urine. Unless instructed otherwise, your stent should not stay longer than 3 MONTHS.    WHAT SHOULD I EXPECT?    You may resume your normal routine. You may experience some of the below common symptoms after your stent has been placed:   - An urgent need to pass urine.  - The need to pass urine more often.  - Burning or pain in your lower back when passing urine.  - Blood in your urine   - Feeling as if you are not able to empty your bladder all the way.  - Sharp or cramping pain in your back, side, or lower abdomen.     WHAT IS STENT PAIN?   Stents can cause sharp or cramping pain in the back, side, or lower abdomen. You may also notice a strong desire to urinate with pain over the bladder area (this is called a bladder spasm).     You may have been prescribed some of the following medications to help:   Tylenol - You can take up to 4g/day of Tylenol. Ask your doctor if you have liver problems.    NSAIDs (Motrin, Aleve) - Effective in alleviating cramping pain. Do not use if history of ulcers or  have been told by a doctor to avoid. Can cause reflux or stomach irritation.  Narcotics - Take for pain not relieved by Tylenol or NSAIDs. Can cause constipation, nausea, itchiness, dizziness, and drowsiness.   Flomax - Taken nightly to help increase the flow of urine. Can cause lightheadedness or drop in blood pressure.    Ditropan - Taken as needed for bladder spasms. Can cause dry mouth, dizziness, dry eyes, stomach upset, dry or flushed skin. Should be used cautiously in those older than 60 years.   Pyridium - Alleviates burning with urination. Turns your urine orange. Should not be used for >3 days or in patients with kidney dysfunction.     I SEE BLOOD IN MY URINE, IS THIS NORMAL?   Stents may cause your urine to be tea colored, pink, or bright red. You may even notice some clots. Blood in urine should come and go. The color can range from light pink/orange to cherry colored. You will likely notice it worsen after physical activity or straining to have a bowel movement. Drinking plenty of fluids should help. You may continue to monitor as long as urine remains see-through.    WHEN SHOULD I CALL?     Red Light: Act NOW. You should be seen right away. Call 911 or go to nearest hospital.   - Hard to breathe.   - New seizure.   - Chest pain or worsening shortness of breath.   - Fall or passing out.    Yellow Light: Act TODAY. Call our office. You may need to be seen that day.  - Fever over 101??F  - Nausea and/or vomiting. Unable to eat or drink.    - No urine for 10 hours.    - Confusion.   - Pain that is severe, worsening, and not relieved by pain medications.   - Blood in in the urine is thick and bright red, to the point where you can no longer see-through it.   - Clots in the urine are larger than the size of a penny.   - Stent falls out.     Green Light: No need to act. Your symptoms are under control. Keep all visits as scheduled.   - Pain or discomfort  - Urinary frequency or urgency   - Burning or pain during urination   - Blood in the urine   - Sensation of incomplete emptying of the bladder       Warning Signs: Always call for: Fever over 101??F, difficulty breathing, chest pain, palpitations, vomiting, swelling or pain in one leg. Come to the Emergency room if the office is closed.    Contact Information: If you need assistance weekdays between 8:00AM - 4:00PM, please call the Urology clinic at [phone]. If you have a nighttime or weekend EMERGENCY, please call [phone] and ask to speak to the Urology resident on call. Note, we are handling all incoming hospital and outside calls, so please be patient and we will be in touch with you as soon as possible.

## 2023-03-26 NOTE — Unmapped (Signed)
Preoperative Diagnosis: right ureteral stone    Postoperative Diagnosis:  Same    Procedure(s):  Right - CYSTOURETHROSCOPY,  WITH INSERTION OF INDWELLING URETERAL STENT (EG, GIBBONS OR DOUBLE-J TYPE)  Right - CYSTOURETHROSCOPY, W/URETERAL CATHETERIZATION, W/WO IRRIG, INSTILL, OR URETEROPYELOG, EXCLUS OF RADIOLG SVC    Performing Service: Urology  Surgeons and Role:     * Annye English, MD - Primary     * Beverly Sessions, MD - Assisting    Assistant(s):  None     Anesthesia: General    Fluids:  See anesthesia record    Estimated blood loss:  Minimal    Complications: None    Specimens: None collected    Drains:    Implant Name Type Inv. Item Serial No. Manufacturer Lot No. LRB No. Used Action   Stnt Ureteral 32frx24cm Contour - [id number] Stent Stnt Ureteral 84frx24cm Contour  [employer] [id number] Right 1 Implanted       Indications: 22 y.o. female with right mid ureteral stone presenting for treatment. Risks, benefits, and alternatives of the above procedure were discussed and informed consent was signed.    OperativeFindings:    Cystourethroscopy with no abnormalities in the bladder  Right retrograde pyelogram with no notable filling defect  Stone felt during stent passage  6x24 stent placed without string without issue  KUB after stent placement revealed appropriate curls proximally and distally.     Description:  The patient was correctly identified in the preop holding area where written informed consent as well potential risk and complication reviewed. She agreed. The patient was brought to the operative suite where a preinduction timeout was performed. Once correct information was verified, general anesthesia was induced. The patient was then gently placed into dorsal lithotomy position with SCDs in place for VTE prophylaxis. They were prepped and draped in the usual sterile fashion and given appropriate preoperative antibiotics with cefazolin. A second timeout was then performed.     We inserted a 27F rigid cystoscope per urethra with copious lubrication and normal saline irrigation running. We performed cystourethroscopy, which revealed the above findings.    We turned our attention to the Right ureteral orifice and canulated it with a sensor wire, using assistance of an open ended catheter. A retrograde pyelogram was performed with the above findings. The wire was then replaced. The wire was advanced into the renal pelvis without difficulty under fluoroscopic guidance.  We then advanced a 6Fr x 24cm JJ ureteral stent without string with the assistance of a pusher over our wire under visual and fluoroscopic guidance without difficulty. Wire removal demonstrated satisfactory stent curl proximally in renal pelvis and distally in the bladder.      The patient was awoken from general anesthesia having tolerated the procedure well and taken to the PACU for routine post-operative recovery.    Post-Op Plan:    - Discharge patient to the floor when meets PACU criteria.  - Patient can likely discharge later today to home  - Will follow-up for definitive stone surgery (requested today)    Beverly Sessions, MD  Date: 03/26/2023  Time: 8:33 AM    Urology Surgery Scheduling Checklist    Timing: Next available  Attending: Any available  Urology Pre-op:  No  Pre Anesthesia (Precare):  Yes - Scheduler place order when surgery confirmed  Urine:  Obtained previously/today - Needs repeat if surgery is scheduled after 4 weeks  Testing Plan:  No additional testing  Blood thinners:  None  Cardiac/Anticoagulation  Clearance: Not indicated

## 2023-03-27 ENCOUNTER — Ambulatory Visit
Admit: 2023-03-27 | Discharge: 2023-03-27 | Disposition: A | Discharge status: Home / Self Care | Payer: PRIVATE HEALTH INSURANCE

## 2023-03-27 ENCOUNTER — Emergency Department
Admit: 2023-03-27 | Discharge: 2023-03-27 | Disposition: A | Discharge status: Home / Self Care | Payer: PRIVATE HEALTH INSURANCE

## 2023-03-27 DIAGNOSIS — T8384XA Pain from genitourinary prosthetic devices, implants and grafts, initial encounter: Principal | ICD-10-CM

## 2023-03-27 DIAGNOSIS — N23 Unspecified renal colic: Principal | ICD-10-CM

## 2023-03-27 LAB — COMPREHENSIVE METABOLIC PANEL
ALBUMIN: 4 g/dL (ref 3.4–5.0)
ALKALINE PHOSPHATASE: 57 U/L (ref 46–116)
ALT (SGPT): 27 U/L (ref 10–49)
ANION GAP: 6 mmol/L (ref 5–14)
AST (SGOT): 27 U/L (ref <=34)
BILIRUBIN TOTAL: 0.3 mg/dL (ref 0.3–1.2)
BLOOD UREA NITROGEN: 10 mg/dL (ref 9–23)
BUN / CREAT RATIO: 11
CALCIUM: 9.8 mg/dL (ref 8.7–10.4)
CHLORIDE: 107 mmol/L (ref 98–107)
CO2: 29.5 mmol/L (ref 20.0–31.0)
CREATININE: 0.88 mg/dL
EGFR CKD-EPI (2021) FEMALE: >90 mL/min/{1.73_m2} (ref >=60)
GLUCOSE RANDOM: 98 mg/dL (ref 70–179)
POTASSIUM: 3.7 mmol/L (ref 3.4–4.8)
PROTEIN TOTAL: 7.7 g/dL (ref 5.7–8.2)
SODIUM: 142 mmol/L (ref 135–145)

## 2023-03-27 LAB — CBC W/ AUTO DIFF
BASOPHILS ABSOLUTE COUNT: 0.1 10*9/L (ref 0.0–0.1)
BASOPHILS RELATIVE PERCENT: 0.5 %
EOSINOPHILS ABSOLUTE COUNT: 0.1 10*9/L (ref 0.0–0.5)
EOSINOPHILS RELATIVE PERCENT: 0.4 %
HEMATOCRIT: 42.8 % (ref 34.0–44.0)
HEMOGLOBIN: 14.4 g/dL (ref 11.3–14.9)
LYMPHOCYTES ABSOLUTE COUNT: 2.7 10*9/L (ref 1.1–3.6)
LYMPHOCYTES RELATIVE PERCENT: 22.5 %
MEAN CORPUSCULAR HEMOGLOBIN CONC: 33.6 g/dL (ref 32.3–35.0)
MEAN CORPUSCULAR HEMOGLOBIN: 29.5 pg (ref 25.9–32.4)
MEAN CORPUSCULAR VOLUME: 87.8 fL (ref 77.6–95.7)
MEAN PLATELET VOLUME: 8.8 fL (ref 7.3–10.7)
MONOCYTES ABSOLUTE COUNT: 1 10*9/L — ABNORMAL HIGH (ref 0.3–0.8)
MONOCYTES RELATIVE PERCENT: 8.5 %
NEUTROPHILS ABSOLUTE COUNT: 8.1 10*9/L — ABNORMAL HIGH (ref 1.5–6.4)
NEUTROPHILS RELATIVE PERCENT: 68.1 %
NUCLEATED RED BLOOD CELLS: 0 /100{WBCs} (ref <=4)
PLATELET COUNT: 263 10*9/L (ref 170–380)
RED BLOOD CELL COUNT: 4.87 10*12/L (ref 3.95–5.13)
RED CELL DISTRIBUTION WIDTH: 13.5 % (ref 12.2–15.2)
WBC ADJUSTED: 12 10*9/L — ABNORMAL HIGH (ref 4.2–10.2)

## 2023-03-27 LAB — URINALYSIS WITH MICROSCOPY WITH CULTURE REFLEX PERFORMABLE
BILIRUBIN UA: NEGATIVE
GLUCOSE UA: NEGATIVE
KETONES UA: NEGATIVE
NITRITE UA: NEGATIVE
PH UA: 6.5 (ref 5.0–9.0)
PROTEIN UA: 70 — AB
RBC UA: >182 /HPF — ABNORMAL HIGH (ref <=4)
SPECIFIC GRAVITY UA: 1.021 (ref 1.003–1.030)
SQUAMOUS EPITHELIAL: 3 /HPF (ref 0–5)
UROBILINOGEN UA: <2
WBC UA: 16 /HPF — ABNORMAL HIGH (ref 0–5)

## 2023-03-27 LAB — CK: CREATINE KINASE TOTAL: 675 U/L — ABNORMAL HIGH

## 2023-03-27 LAB — LACTATE, VENOUS, WHOLE BLOOD: LACTATE BLOOD VENOUS: 1.4 mmol/L (ref 0.5–1.8)

## 2023-03-27 MED ADMIN — acetaminophen (TYLENOL) tablet 650 mg: 650 mg | ORAL | @ 23:00:00 | Stop: 2023-03-27

## 2023-03-27 MED ADMIN — ketorolac (TORADOL) injection 15 mg: 15 mg | INTRAVENOUS | @ 21:00:00 | Stop: 2023-03-27

## 2023-03-27 MED ADMIN — lactated ringers bolus 1,000 mL: 1000 mL | INTRAVENOUS | @ 20:00:00 | Stop: 2023-03-27

## 2023-03-27 MED ADMIN — ondansetron (ZOFRAN) injection 4 mg: 4 mg | INTRAVENOUS | @ 21:00:00 | Stop: 2023-03-27

## 2023-03-27 NOTE — Unmapped (Signed)
Pt is coming in by EMS for c/o having some body aches and chills. Has been seen for the same for the past several days now.

## 2023-03-27 NOTE — Unmapped (Shared)
 [facility] Emergency Department Provider Note      ED Clinical Impression     Final diagnoses:   None     HPI, ED Course, Assessment and Plan     Initial Clinical Impression:    March 27, 2023 2:31 PM   Brandi Flynn is a 22 y.o. female with past medical history of MS presenting with one day of generalized body aches, localized to her extremities, in the setting of being s/p R ureteral stent placement yesterday. She has been taking Tylenol and Toradol without relief.  She endorsed RLQ abdominal pain, but this has since improved following her stent placement. She had one episode of urinary incontinence this morning.     Per chart review: Patient was discharged from [facility] for a 6mm Mid ureteral stone.  She underwent a right ureteral stent placement yesterday.    BP 143/82  - Pulse 88  - Temp 36.4 ??C (97.5 ??F) (Oral)  - Resp 16  - Wt (!) 139.7 kg (308 lb)  - SpO2 100%  - BMI 54.57 kg/m??     MDM  On exam, patient is well-appearing in no acute distress.  Vital signs without significant abnormalities.  She has 5-5 strength in the bilateral upper and lower extremities.  She has some diffuse tenderness over all muscle groups.  Her abdomen is soft, nondistended, nontender to palpation.  Overall exam has been reassuring.  No rash.  Differential includes postoperative pain, stent dislodgment, possible electrolyte abnormalities versus rhabdo myelitis secondary to recent anesthesia, versus worsening infection.  Will obtain labs, UA, and abdominal x-ray.    Further ED updates and updates to plan as per ED Course below:    ED Course:  ED Course as of 03/27/23 1835   Sun Mar 27, 2023   1607 CBC with mild leukocytosis WBC of 12.  No anemia.  CMP without significant electrolyte abnormalities.  Normal creatinine.  LFTs within normal limits.  Lactate also normal with lower suspicion for sepsis and patient not meeting any SIRS criteria.  UA shows large leuk esterase, large blood, RBCs greater than 1 82 and WBCs of 16 with moderate bacteria.  Suspect is most likely secondary to the known stent.  CK is mildly elevated to 675 for which patient is currently receiving IV fluids.  No significant hyperkalemia or renal dysfunction secondary to elevated CK.  Currently pending abdominal x-ray to evaluate positioning of stents   1702 XR Abdomen 2 Views and Chest 1 View  IMPRESSION:  Right double-J ureteral stent is projecting over expected location.   1835 On reassessment, patient's pain is feeling improved.  Will plan to discharge patient with continued supportive care instructions with home ibuprofen and prior prescription for Toradol.  Patient also has Zofran as needed for nausea.  She is currently tolerating p.o. without difficulty.  Encourage patient to continue to follow-up with urology and primary care doctor.  Return precautions given.       Social Determinants of Health with Concerns     Internet Connectivity: Not on file   Tobacco Use: High Risk (03/25/2023)    Patient History    ??? Smoking Tobacco Use: Every Day    ??? Smokeless Tobacco Use: Never    ??? Passive Exposure: Not on file   Alcohol Use: Not on file   Substance Use: Not on file   Health Literacy: Medium Risk (01/14/2023)    Health Literacy    ??? : Sometimes   Physical Activity: Not  on file   Interpersonal Safety: Not on file   Stress: Not on file   Intimate Partner Violence: Not on file   Social Connections: Not on file     _____________________________________________________________________    The case was discussed with the attending physician who is in agreement with the above assessment and plan    Additional Medical Decision Making     I have reviewed the vital signs and the nursing notes. Labs and radiology results that were available during my care of the patient were independently reviewed by me and considered in my medical decision making.   I independently visualized the EKG tracing if performed  I independently visualized the radiology images if performed  I reviewed the patient's prior medical records if available.  Additional history obtained from family if available.  For specific reads/information impacting care please refer to MDM/ED Course continued documentation    Past History     PAST MEDICAL HISTORY/PAST SURGICAL HISTORY:   Past Medical History:   Diagnosis Date   ??? Disease of thyroid gland    ??? Multiple sclerosis (CMS-HCC)    ??? Obesity    ??? Vertigo        No past surgical history on file.    MEDICATIONS:   No current facility-administered medications for this encounter.    Current Outpatient Medications:   ???  acetaminophen (TYLENOL EXTRA STRENGTH) 500 MG tablet, Take 2 tablets (1,000 mg total) by mouth every eight (8) hours for 7 days., Disp: 42 tablet, Rfl: 0  ???  baclofen (LIORESAL) 10 MG tablet, Take 1 tablet (10 mg total) by mouth daily. (Patient not taking: Reported on 03/25/2023), Disp: 30 tablet, Rfl: 1  ???  carboxymethylcellulose sodium (REFRESH CELLUVISC) 1 % DpGe, Administer 1 drop to both eyes four (4) times a day., Disp: 1 each, Rfl: 0  ???  cetirizine (ZYRTEC) 10 MG tablet, Take 1 tablet (10 mg total) by mouth daily as needed for allergies., Disp: 30 tablet, Rfl: 0  ???  fluconazole (DIFLUCAN) 150 MG tablet, Take 1 tablet (150 mg total) by mouth once a week for 6 doses., Disp: 6 tablet, Rfl: 0  ???  ketorolac (TORADOL) 10 mg tablet, Take 1 tablet (10 mg total) by mouth every six (6) hours as needed for pain for up to 7 days., Disp: 20 tablet, Rfl: 0  ???  loperamide (IMODIUM A-D) 2 mg tablet, Take 1 tablet (2 mg total) by mouth four (4) times a day as needed for diarrhea., Disp: 30 tablet, Rfl: 0  ???  nystatin (MYCOSTATIN) 100,000 unit/gram powder, Apply 1 Application topically two (2) times a day., Disp: 60 g, Rfl: 1  ???  ondansetron (ZOFRAN-ODT) 8 MG disintegrating tablet, Dissolve 1 tablet (8 mg total) in the mouth every eight (8) hours as needed for nausea., Disp: 90 tablet, Rfl: 0  ???  oxybutynin (DITROPAN) 5 MG tablet, Take 1 tablet (5 mg total) by mouth Three (3) times a day for 5 days., Disp: 15 tablet, Rfl: 0  ???  phenazopyridine (PYRIDIUM) 100 MG tablet, Take 2 tablets (200 mg total) by mouth Three (3) times a day as needed for pain for up to 2 days., Disp: 12 tablet, Rfl: 0  ???  tamsulosin (FLOMAX) 0.4 mg capsule, Take 1 capsule (0.4 mg total) by mouth daily., Disp: 30 capsule, Rfl: 0  ???  tamsulosin (FLOMAX) 0.4 mg capsule, Take 1 capsule (0.4 mg total) by mouth daily., Disp: 30 capsule, Rfl: 0  ???  teriflunomide 14  mg Tab, Take 1 tablet (14 mg total) by mouth in the morning., Disp: 30 tablet, Rfl: 5    ALLERGIES:   Blueberry and Peanut    SOCIAL HISTORY:   Social History     Tobacco Use   ??? Smoking status: Every Day     Types: e-Cigarettes   ??? Smokeless tobacco: Never   ??? Tobacco comments:     Vape   Substance Use Topics   ??? Alcohol use: Not Currently       FAMILY HISTORY:  Family History   Problem Relation Age of Onset   ??? Thyroid disease Mother    ??? Thyroid disease Maternal Grandmother    ??? Thyroid disease Maternal Grandfather    ??? Thyroid disease Paternal Grandmother    ??? Vitiligo Paternal Grandmother           Review of Systems     A review of systems was performed and relevant portions were as noted above in HPI     Physical Exam     VITAL SIGNS:    BP 143/82  - Pulse 88  - Temp 36.4 ??C (97.5 ??F) (Oral)  - Resp 16  - Wt (!) 139.7 kg (308 lb)  - SpO2 100%  - BMI 54.57 kg/m??     Constitutional:   Alert and oriented.   Head:   Normocephalic and atraumatic  Eyes:   Conjunctivae are normal, EOMI, PERRL  ENT:   No notable congestion, Mucous membranes moist, External ears normal, no notable stridor  Cardiovascular:   Rate as vitals above. Appears warm and well perfused  Respiratory:   Normal respiratory effort. Breath sounds are normal.  Gastrointestinal:   Soft, non-distended, and nontender.   Genitourinary:   Deferred  Musculoskeletal:    Normal range of motion in all extremities. No tenderness or edema noted in B/L lower extremities  Neurologic:   No gross focal neurologic deficits beyond baseline are appreciated.  Skin:   Skin is warm, dry and intact.       Radiology     No orders to display       Labs     Labs Reviewed   CBC W/ AUTO DIFF - Abnormal; Notable for the following components:       Result Value    WBC 12.0 (*)     Absolute Neutrophils 8.1 (*)     Absolute Monocytes 1.0 (*)     All other components within normal limits   BLOOD CULTURE   BLOOD CULTURE   CBC W/ DIFFERENTIAL    Narrative:     The following orders were created for panel order CBC w/ Differential.                  Procedure                               Abnormality         Status                                     ---------                               -----------         ------  CBC w/ [id number]]         Abnormal            Final result                                                 Please view results for these tests on the individual orders.   COMPREHENSIVE METABOLIC PANEL   LACTATE, VENOUS, WHOLE BLOOD   URINALYSIS WITH MICROSCOPY WITH CULTURE REFLEX    Narrative:     The following orders were created for panel order Urinalysis with Microscopy with Culture Reflex.                  Procedure                               Abnormality         Status                                     ---------                               -----------         ------                                     Urinalysis with Microsc.Marland KitchenMarland Kitchen[[id number]]                                                                                   Please view results for these tests on the individual orders.   URINALYSIS WITH MICROSCOPY WITH CULTURE REFLEX PERFORMABLE         Pertinent labs & imaging results that were available during my care of the patient were reviewed by me and considered in my medical decision making (see chart for details).    Please note- This chart has been created using [employer]. Chart creation errors have been sought, but may not always be located and such creation errors, especially pronoun confusion, do NOT reflect on the standard of medical care.

## 2023-03-28 NOTE — Unmapped (Unsigned)
Assessment & Plan        I personally spent *** minutes face-to-face and non-face-to-face in the care of this patient, which includes all pre, intra, and post visit time on the date of service.             Problem List Items Addressed This Visit    None       No follow-ups on file.     Medication adherence and barriers to the treatment plan have been addressed. Opportunities to optimize healthy behaviors have been discussed. Patient / caregiver voiced understanding.        Subjective:     Brandi Flynn is a 22 y.o. female who presents for No chief complaint on file.            History of Present Illness       Patient presents for nexplanon removal / insertion. Last seen by PCP 03/25/23. Kidney stones -  Patient was advised to go to ER for IV hydration and pain management     Hospitalization 03/25/23 to 03/26/23 -The patient was transferred from [place] with a CT confirmed Right 6mm Mid ureteral stone. They underwent Right ureteral stent placement on 03/26/2023. The patient tolerated the procedure well, was extubated in the OR, and afterwards was taken to the PACU for routine post-surgical care. When stable the patient was transferred back to the floor. The patient did well postoperatively. Their diet was slowly advanced and at the time of discharge the patient was tolerating a regular diet. The patient was discharged home on later that day Day of Surgery, at which point the patient was tolerating a regular solid diet, was able to void spontaneously, have their pain controlled with P.O. pain medication, and able to ambulate without difficulty.    OR Procedures: Right - CYSTOURETHROSCOPY, WITH INSERTION OF INDWELLING URETERAL STENT (EG, GIBBONS OR DOUBLE-J TYPE) Right - CYSTOURETHROSCOPY, W/URETERAL CATHETERIZATION, W/WO IRRIG, INSTILL, OR URETEROPYELOG, EXCLUS OF RADIOLG SVC 03/26/2023     Seen by ED 03/27/23 for renal colic    PHQ-2 Score:      PHQ-9 Score:      Inocente Salles Score: {select_status_or_delete_smartlist:64641}    Review of Systems     History obtained from {:[id number]} and chart review     Unless otherwise stated in the HPI:  CONSTITUTIONAL: no f/c/s  HEENT: Eyes: No diplopia or blurred vision. ENT: No earache, sore throat or runny nose.   CARDIOVASCULAR: No pressure, squeezing, strangling, tightness, heaviness or aching about the chest, neck, axilla or epigastrium.   RESPIRATORY: No cough, shortness of breath, PND or orthopnea.   GASTROINTESTINAL: No nausea, vomiting or diarrhea.   GENITOURINARY: No dysuria, frequency or urgency.   MUSCULOSKELETAL: As per HPI.   SKIN: No change in skin, hair or nails.   NEUROLOGIC: No paresthesias, fasciculations, seizures or weakness.   PSYCHIATRIC: No disorder of thought or mood.   ENDOCRINE: No heat or cold intolerance, polyuria or polydipsia.   HEMATOLOGICAL: No easy bruising or bleeding.            Review of History     MEDICATIONS:        Current Outpatient Medications on File Prior to Visit   Medication Sig Dispense Refill    acetaminophen (TYLENOL EXTRA STRENGTH) 500 MG tablet Take 2 tablets (1,000 mg total) by mouth every eight (8) hours for 7 days. 42 tablet 0    baclofen (LIORESAL) 10 MG tablet Take 1 tablet (10 mg total) by mouth daily. (Patient  not taking: Reported on 03/25/2023) 30 tablet 1    carboxymethylcellulose sodium (REFRESH CELLUVISC) 1 % DpGe Administer 1 drop to both eyes four (4) times a day. 1 each 0    [EXPIRED] cephalexin (KEFLEX) 500 MG capsule Take 1 capsule (500 mg total) by mouth four (4) times a day for 5 days. 20 capsule 0    cetirizine (ZYRTEC) 10 MG tablet Take 1 tablet (10 mg total) by mouth daily as needed for allergies. 30 tablet 0    [EXPIRED] doxycycline (VIBRAMYCIN) 100 MG capsule Take 1 capsule (100 mg total) by mouth two (2) times a day for 10 days. 20 capsule 0    fluconazole (DIFLUCAN) 150 MG tablet Take 1 tablet (150 mg total) by mouth once a week for 6 doses. 6 tablet 0    [EXPIRED] ketorolac (TORADOL) 10 mg tablet Take 1 tablet (10 mg total) by mouth every six (6) hours as needed for pain for up to 7 days. 28 tablet 0    ketorolac (TORADOL) 10 mg tablet Take 1 tablet (10 mg total) by mouth every six (6) hours as needed for pain for up to 7 days. 20 tablet 0    loperamide (IMODIUM A-D) 2 mg tablet Take 1 tablet (2 mg total) by mouth four (4) times a day as needed for diarrhea. 30 tablet 0    nystatin (MYCOSTATIN) 100,000 unit/gram powder Apply 1 Application topically two (2) times a day. 60 g 1    ondansetron (ZOFRAN-ODT) 8 MG disintegrating tablet Dissolve 1 tablet (8 mg total) in the mouth every eight (8) hours as needed for nausea. 90 tablet 0    oxybutynin (DITROPAN) 5 MG tablet Take 1 tablet (5 mg total) by mouth Three (3) times a day for 5 days. 15 tablet 0    phenazopyridine (PYRIDIUM) 100 MG tablet Take 2 tablets (200 mg total) by mouth Three (3) times a day as needed for pain for up to 2 days. 12 tablet 0    tamsulosin (FLOMAX) 0.4 mg capsule Take 1 capsule (0.4 mg total) by mouth daily. 30 capsule 0    tamsulosin (FLOMAX) 0.4 mg capsule Take 1 capsule (0.4 mg total) by mouth daily. 30 capsule 0    teriflunomide 14 mg Tab Take 1 tablet (14 mg total) by mouth in the morning. 30 tablet 5     Current Facility-Administered Medications on File Prior to Visit   Medication Dose Route Frequency Provider Last Rate Last Admin    [COMPLETED] acetaminophen (TYLENOL) tablet 650 mg  650 mg Oral Once Klinc, Harvin Hazel, MD   650 mg at 03/27/23 1841    [COMPLETED] ketorolac (TORADOL) injection 15 mg  15 mg Intravenous Once Klinc, Harvin Hazel, MD   15 mg at 03/27/23 1649    [COMPLETED] lactated ringers bolus 1,000 mL  1,000 mL Intravenous Once Magda Bernheim, MD   Stopped at 03/27/23 1827    [COMPLETED] ondansetron (ZOFRAN) injection 4 mg  4 mg Intravenous Once Magda Bernheim, MD   4 mg at 03/27/23 1648        ALLERGIES:      Blueberry and Peanut         PAST MEDICAL HISTORY:     Past Medical History:   Diagnosis Date Disease of thyroid gland     Multiple sclerosis (CMS-HCC)     Obesity     Vertigo        PAST SURGICAL HISTORY:   No past surgical history on file.    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 She has never used smokeless tobacco. She reports that she does not currently use alcohol. She reports that she does not use drugs.   FAMILY HISTORY:     family history includes Thyroid disease in her maternal grandfather, maternal grandmother, mother, and paternal grandmother; Vitiligo in her paternal grandmother.       Objective:    There were no vitals taken for this visit. - There is no height or weight on file to calculate BMI.   Physical exam:    ***         No results found for this or any previous visit (from the past 24 hour(s)).     I attest that I, Paulino Rily, personally documented this note while acting as scribe for Arlyss Gandy, MD.      Paulino Rily, Scribe.  04/06/2023     The documentation recorded by the scribe accurately reflects the service I personally performed and the decisions made by me.    Arlyss Gandy, MD

## 2023-03-29 DIAGNOSIS — R9082 White matter disease, unspecified: Principal | ICD-10-CM

## 2023-03-30 ENCOUNTER — Emergency Department
Admit: 2023-03-30 | Discharge: 2023-03-31 | Disposition: A | Discharge status: Home / Self Care | Payer: PRIVATE HEALTH INSURANCE

## 2023-03-30 ENCOUNTER — Ambulatory Visit
Admit: 2023-03-30 | Discharge: 2023-03-31 | Disposition: A | Discharge status: Home / Self Care | Payer: PRIVATE HEALTH INSURANCE

## 2023-03-30 LAB — COMPREHENSIVE METABOLIC PANEL
ALBUMIN: 4.1 g/dL (ref 3.4–5.0)
ALKALINE PHOSPHATASE: 60 U/L (ref 46–116)
ALT (SGPT): 60 U/L — ABNORMAL HIGH (ref 10–49)
ANION GAP: 7 mmol/L (ref 5–14)
AST (SGOT): 45 U/L — ABNORMAL HIGH (ref <=34)
BILIRUBIN TOTAL: 0.6 mg/dL (ref 0.3–1.2)
BLOOD UREA NITROGEN: 8 mg/dL — ABNORMAL LOW (ref 9–23)
BUN / CREAT RATIO: 11
CALCIUM: 9.9 mg/dL (ref 8.7–10.4)
CHLORIDE: 104 mmol/L (ref 98–107)
CO2: 28.9 mmol/L (ref 20.0–31.0)
CREATININE: 0.72 mg/dL
EGFR CKD-EPI (2021) FEMALE: >90 mL/min/{1.73_m2} (ref >=60)
GLUCOSE RANDOM: 105 mg/dL (ref 70–179)
POTASSIUM: 3.9 mmol/L (ref 3.4–4.8)
PROTEIN TOTAL: 8 g/dL (ref 5.7–8.2)
SODIUM: 140 mmol/L (ref 135–145)

## 2023-03-30 LAB — CBC W/ AUTO DIFF
BASOPHILS ABSOLUTE COUNT: 0.1 10*9/L (ref 0.0–0.1)
BASOPHILS RELATIVE PERCENT: 0.4 %
EOSINOPHILS ABSOLUTE COUNT: 0.2 10*9/L (ref 0.0–0.5)
EOSINOPHILS RELATIVE PERCENT: 1.6 %
HEMATOCRIT: 46 % — ABNORMAL HIGH (ref 34.0–44.0)
HEMOGLOBIN: 15.4 g/dL — ABNORMAL HIGH (ref 11.3–14.9)
LYMPHOCYTES ABSOLUTE COUNT: 2.2 10*9/L (ref 1.1–3.6)
LYMPHOCYTES RELATIVE PERCENT: 17.2 %
MEAN CORPUSCULAR HEMOGLOBIN CONC: 33.5 g/dL (ref 32.3–35.0)
MEAN CORPUSCULAR HEMOGLOBIN: 29.3 pg (ref 25.9–32.4)
MEAN CORPUSCULAR VOLUME: 87.4 fL (ref 77.6–95.7)
MEAN PLATELET VOLUME: 8.9 fL (ref 7.3–10.7)
MONOCYTES ABSOLUTE COUNT: 0.9 10*9/L — ABNORMAL HIGH (ref 0.3–0.8)
MONOCYTES RELATIVE PERCENT: 6.6 %
NEUTROPHILS ABSOLUTE COUNT: 9.6 10*9/L — ABNORMAL HIGH (ref 1.5–6.4)
NEUTROPHILS RELATIVE PERCENT: 74.2 %
NUCLEATED RED BLOOD CELLS: 0 /100{WBCs} (ref <=4)
PLATELET COUNT: 258 10*9/L (ref 170–380)
RED BLOOD CELL COUNT: 5.26 10*12/L — ABNORMAL HIGH (ref 3.95–5.13)
RED CELL DISTRIBUTION WIDTH: 13.5 % (ref 12.2–15.2)
WBC ADJUSTED: 13 10*9/L — ABNORMAL HIGH (ref 4.2–10.2)

## 2023-03-30 LAB — URINALYSIS WITH MICROSCOPY WITH CULTURE REFLEX PERFORMABLE
BILIRUBIN UA: NEGATIVE
GLUCOSE UA: NEGATIVE
HYALINE CASTS: 2 /LPF — ABNORMAL HIGH (ref 0–1)
KETONES UA: NEGATIVE
NITRITE UA: NEGATIVE
PH UA: 7 (ref 5.0–9.0)
RBC UA: >182 /HPF — ABNORMAL HIGH (ref <=4)
SPECIFIC GRAVITY UA: 1.016 (ref 1.003–1.030)
SQUAMOUS EPITHELIAL: 1 /HPF (ref 0–5)
UROBILINOGEN UA: <2
WBC UA: 7 /HPF — ABNORMAL HIGH (ref 0–5)

## 2023-03-30 LAB — LIPASE: LIPASE: 43 U/L (ref 12–53)

## 2023-03-30 LAB — PREGNANCY, URINE: PREGNANCY TEST URINE: NEGATIVE

## 2023-03-30 NOTE — Unmapped (Signed)
Pt to triage via [facility] for worsening right flank pain in the setting of known Kidney Stones  Recent stent placement for same.  +dizziness/headache  +n/v

## 2023-03-31 MED ORDER — CEFDINIR 300 MG CAPSULE
ORAL_CAPSULE | Freq: Every day | ORAL | 0 refills | 10 days | Status: CP
Start: 2023-03-31 — End: 2023-04-10

## 2023-03-31 MED ADMIN — iohexol (OMNIPAQUE) 350 mg iodine/mL solution 100 mL: 100 mL | INTRAVENOUS | @ 02:00:00 | Stop: 2023-03-30

## 2023-03-31 MED ADMIN — cefTRIAXone (ROCEPHIN) 2 g in sodium chloride 0.9 % (NS) 100 mL IVPB-MBP: 2 g | INTRAVENOUS | @ 02:00:00 | Stop: 2023-03-30

## 2023-03-31 MED ADMIN — ondansetron (ZOFRAN) injection 4 mg: 4 mg | INTRAVENOUS | @ 01:00:00 | Stop: 2023-03-30

## 2023-03-31 MED ADMIN — sodium chloride 0.9% (NS) bolus 1,000 mL: 1000 mL | INTRAVENOUS | @ 01:00:00 | Stop: 2023-03-30

## 2023-03-31 MED ADMIN — ketorolac (TORADOL) injection 15 mg: 15 mg | INTRAVENOUS | @ 01:00:00 | Stop: 2023-03-30

## 2023-03-31 NOTE — Unmapped (Signed)
[facility]  Emergency Department Provider Note       ED Clinical Impression     Final diagnoses:   Abdominal pain, unspecified abdominal location (Primary)   Nausea and vomiting, unspecified vomiting type      HPI, MDM, ED Course   Chief Complaint  Chief Complaint   Patient presents with    Flank Pain     HPI   Brandi Flynn is a 22 y.o. female previous history of MS presenting with flank pain. The patient reports 1 day of lightheadedness, described as feeling like she was going to pass out, as well as 2 episodes pf NBNB emesis which began while at rest. Further, the patient recently underwent urinary stent placement on 03/26/23 and endorses epigastric pain, R flank pain, back pain, diminished PO intake and intermittent hematuria since the procedure. Low grade fever of 31F yesterday. Her pain is consistent with kidney stone though more intense. Tried taking tylenol and Zofran earlier, though she vomited shortly after. No alcohol or drug use. Pt denies any headaches, vision changes, ataxia, chest pain, dyspnea, new rashes, recent travel.    Per chart review, the patient  underwent Right ureteral stent placement on 03/26/2023 for right 6mm Mid ureteral stone which she tolerated well. She was able to ambulate without difficulty and was discharged home later the day of her surgery with oral pain medication for postoperative pain.     Vitals are afebrile and hemodynamically stable. On exam, the patient is obese, in no acute distress. Acanthosis nigricans. R side flank pain and epigastric tenderness to palpation. No significant peripheral edema in lower extremities.     ED Vitals: Afebrile, HR 93, BP 135/65, RR 18, 96% RA.    MDM: Pt presentation most consistent with acute progressive R flank/epigastric pain, N/V/D in the 22 year old female s/p R ureteral stent. Differentials include but not limited to UTI vs pyelonephritis, infected ureteral stent, abscess, gallbladder pathology (biliary colic, choledocolithiasis, acute cholecystitis, ascending cholangitis, acalculous cholecystitis, PUD (w/ or w/out perf), pancreatitis, acute hepatitis, PNA, nephrolithiasis, GERD, appendicitis, atypical ACS, SBO, PE, AAA, dehydration, electrolyte derangement amongst other etiologies. Workup as below, notable for CBC with mild leukocytosis WBC 13, UA with pyuria and hematuria, UPT negative, CMP reassuring, lipase negative.  Orders placed for ceftriaxone, Zofran, 1 L fluid bolus.  Pt disposition: Pt care handed off to oncoming physician pending CT A/P and safe disposition.    Plan:   Orders Placed This Encounter   Procedures    Urine Culture    CT Abdomen Pelvis W IV Contrast Only    CBC w/ Differential    Comprehensive Metabolic Panel    Urinalysis with Microscopy with Culture Reflex    Pregnancy Qualitative, Urine    Lipase        ED Course as of 03/31/23 0017   Wed Mar 30, 2023   2051 CBC w/ Differential(!):    WBC 13.0(!)   RBC 5.26(!)   HGB 15.4(!)   HCT 46.0(!)   MCV 87.4   MCH 29.3   MCHC 33.5   RDW 13.5   MPV 8.9   Platelet 258   nRBC 0   Neutrophils % 74.2   Lymphocytes % 17.2   Monocytes % 6.6   Eosinophils % 1.6   Basophils % 0.4   Absolute Neutrophils 9.6(!)   Absolute Lymphocytes 2.2   Absolute Monocytes  0.9(!)   Absolute Eosinophils 0.2   Absolute Basophils  0.1  Notable for mildly progressive  leukocytosis WBC 13, hemoglobin 15.4, no thrombocytopenia.   2051 Urinalysis with Microscopy with Culture Reflex(!):    Color, UA Light Yellow   Clarity, UA Clear   Spec Grav, UA 1.016   pH, UA 7.0   Leukocyte Esterase, UA Moderate(!)   Nitrite, UA Negative   Protein, UA Trace(!)   Glucose, UA Negative   Ketones, UA Negative   Urobilinogen, UA <2.0 mg/dL   Bilirubin, UA Negative   Blood, UA Moderate(!)   RBC, UA >182(!)   WBC, UA 7(!)   Squam Epithel, UA 1   Bacteria, UA Rare(!)   Hyaline Casts, UA 2(!)   Mucus, UA Rare(!)  Clean-catch, 7 WBCs, significant RBCs with positive leuk esterase.  Will treat with ceftriaxone. In the setting of the progressive leukocytosis.   2056 Preg Test, Ur: Negative   2107 Comprehensive Metabolic Panel(!):    Sodium 140   Potassium 3.9   Chloride 104   CO2 28.9   Anion Gap 7   Bun 8(!)   Creatinine 0.72   BUN/Creatinine Ratio 11   eGFR CKD-EPI (2021) Female >90   Glucose 105   Calcium 9.9   Albumin 4.1   Total Protein 8.0   Total Bilirubin 0.6   SGOT (AST) 45(!)   ALT 60(!)   Alkaline Phosphatase 60  Mild hepatic enzyme elevation otherwise reassuring nonactionable.   2238 Lipase: 43   2247 Glucose, POC: 70  Will provide juice.       ____________________________________________    The case was discussed with the attending physician who is in agreement with the above assessment and plan    Additional Medical Decision Making     I have reviewed the vital signs and the nursing notes. Labs and radiology results that were available during my care of the patient were independently reviewed by me and considered in my medical decision making.     I independently visualized the EKG tracing if performed  I independently visualized the radiology images if performed  I reviewed the patient's prior medical records if available.  Additional history obtained from family if available  I discussed the case with the admitting provider and the consulting services if the patient was admitted and/or consulting services were utilized.     History     Past Medical History:   Diagnosis Date    Disease of thyroid gland     Multiple sclerosis (CMS-HCC)     Obesity     Vertigo        Past Surgical History:   Procedure Laterality Date    PR CYSTOSCOPY,INSERT URETERAL STENT Right 03/26/2023    Procedure: CYSTOURETHROSCOPY,  WITH INSERTION OF INDWELLING URETERAL STENT (EG, GIBBONS OR DOUBLE-J TYPE);  Surgeon: Annye English, MD;  Location: MAIN OR [facility]  Service: Urology    PR CYSTOURETHROSCOPY,URETER CATHETER Right 03/26/2023    Procedure: CYSTOURETHROSCOPY, W/URETERAL CATHETERIZATION, W/WO IRRIG, INSTILL, OR URETEROPYELOG, EXCLUS OF RADIOLG SVC;  Surgeon: Annye English, MD;  Location: MAIN OR [facility];  Service: Urology       No current facility-administered medications for this encounter.    Current Outpatient Medications:     acetaminophen (TYLENOL EXTRA STRENGTH) 500 MG tablet, Take 2 tablets (1,000 mg total) by mouth every eight (8) hours for 7 days., Disp: 42 tablet, Rfl: 0    baclofen (LIORESAL) 10 MG tablet, Take 1 tablet (10 mg total) by mouth daily. (Patient not taking: Reported on 03/25/2023), Disp: 30 tablet, Rfl: 1    carboxymethylcellulose  sodium (REFRESH CELLUVISC) 1 % DpGe, Administer 1 drop to both eyes four (4) times a day., Disp: 1 each, Rfl: 0    cefdinir (OMNICEF) 300 MG capsule, Take 2 capsules (600 mg total) by mouth daily for 10 days., Disp: 20 capsule, Rfl: 0    cetirizine (ZYRTEC) 10 MG tablet, Take 1 tablet (10 mg total) by mouth daily as needed for allergies., Disp: 30 tablet, Rfl: 0    fluconazole (DIFLUCAN) 150 MG tablet, Take 1 tablet (150 mg total) by mouth once a week for 6 doses., Disp: 6 tablet, Rfl: 0    loperamide (IMODIUM A-D) 2 mg tablet, Take 1 tablet (2 mg total) by mouth four (4) times a day as needed for diarrhea., Disp: 30 tablet, Rfl: 0    nystatin (MYCOSTATIN) 100,000 unit/gram powder, Apply 1 Application topically two (2) times a day., Disp: 60 g, Rfl: 1    ondansetron (ZOFRAN-ODT) 8 MG disintegrating tablet, Dissolve 1 tablet (8 mg total) in the mouth every eight (8) hours as needed for nausea., Disp: 90 tablet, Rfl: 0    oxybutynin (DITROPAN) 5 MG tablet, Take 1 tablet (5 mg total) by mouth Three (3) times a day for 5 days., Disp: 15 tablet, Rfl: 0    tamsulosin (FLOMAX) 0.4 mg capsule, Take 1 capsule (0.4 mg total) by mouth daily., Disp: 30 capsule, Rfl: 0    tamsulosin (FLOMAX) 0.4 mg capsule, Take 1 capsule (0.4 mg total) by mouth daily., Disp: 30 capsule, Rfl: 0    teriflunomide 14 mg Tab, Take 1 tablet (14 mg total) by mouth in the morning., Disp: 30 tablet, Rfl: 5    Allergies  Blueberry and Peanut    Family History   Problem Relation Age of Onset    Thyroid disease Mother     Thyroid disease Maternal Grandmother     Thyroid disease Maternal Grandfather     Thyroid disease Paternal Grandmother     Vitiligo Paternal Grandmother        Social History  Social History     Tobacco Use    Smoking status: Every Day     Types: e-Cigarettes    Smokeless tobacco: Never    Tobacco comments:     Vape   Vaping Use    Vaping status: Never Used   Substance Use Topics    Alcohol use: Not Currently    Drug use: Never        Physical Exam     VITAL SIGNS:      Vitals:    03/30/23 1556 03/30/23 2045   BP: 135/65 122/78   Pulse: 93 88   Resp: 18 14   Temp: 36.8 ??C (98.3 ??F) 36.5 ??C (97.7 ??F)   TempSrc: Oral    SpO2: 96% 96%     Constitutional: Alert and oriented. Obese, in no distress.  Eyes: Conjunctivae are normal.  Mouth/Throat: Mucous membranes are moist. No oropharyngeal exudate or erythema.  Cardiovascular: Normal rate, regular rhythm. No murmurs, rubs, or gallops appreciated.  Respiratory: Normal respiratory effort. Breath sounds are normal. No adventitious breath sounds.  Gastrointestinal: Soft. R side flank pain and epigastric tenderness to palpation. No rebound or guarding.   Genitourinary: No suprapubic tenderness.   Musculoskeletal: Normal range of motion in all extremities. No obvious deformity in any extremity. No significant peripheral edema in lower extremities.   Neurologic: Appearance without facial droop, language without expressive or receptive aphasia. Moves all extremities equally. No gross focal neurologic deficits are appreciated.  Skin: Skin is warm, dry and intact. No rash noted. No obvious skin breakdown. Acanthosis nigricans.     Radiology     CT Abdomen Pelvis W IV Contrast Only   Final Result   Post right double-J ureteral stent placement without acute intra-abdominal or pelvic findings.      Hepatic steatosis.              Laboratory Data     Lab Results Component Value Date    WBC 13.0 (H) 03/30/2023    HGB 15.4 (H) 03/30/2023    HCT 46.0 (H) 03/30/2023    PLT 258 03/30/2023       Lab Results   Component Value Date    NA 140 03/30/2023    K 3.9 03/30/2023    CL 104 03/30/2023    CO2 28.9 03/30/2023    BUN 8 (L) 03/30/2023    CREATININE 0.72 03/30/2023    GLU 105 03/30/2023    CALCIUM 9.9 03/30/2023    MG 1.7 03/26/2023    PHOS 2.2 (L) 03/26/2023       Lab Results   Component Value Date    BILITOT 0.6 03/30/2023    BILIDIR 0.20 03/17/2023    PROT 8.0 03/30/2023    ALBUMIN 4.1 03/30/2023    ALT 60 (H) 03/30/2023    AST 45 (H) 03/30/2023    ALKPHOS 60 03/30/2023       No results found for: LABPROT, INR, APTT    Pertinent labs & imaging results that were available during my care of the patient were reviewed by me and considered in my medical decision making (see chart for details).    Portions of this record have been created using Scientist, clinical (histocompatibility and immunogenetics). Dictation errors have been sought, but may not have been identified and corrected.    Documentation assistance was provided by Kela Millin, Scribe, on March 30, 2023 at 8:29 PM for Leighton Parody, MD    I attest that I have reviewed the scribe note and that the components of the history of the present illness, the physical exam, and the assessment and plan documented were performed by me. I verified the documentation and performed the exam and medical decision making.     Leighton Parody, MD  Emergency Medicine, PGY-2     Candis Schatz, MD  Resident  03/31/23 [phone]

## 2023-04-06 NOTE — Unmapped (Deleted)
See Dr. Marletta Lor note.

## 2023-04-06 NOTE — Unmapped (Signed)
Urine Culture  Order: [id number] - Reflex for Order [id number]   Status: Final result      Clean Catch; Urine  Urine Culture, Comprehensive   10,000 to 50,000 CFU/mL Escherichia coli     Patient with a positive urine culture. Patient was started on cefdinir 600 mg Oral Daily (standard) prior to dispo from ED which is susceptible. No further action needed.

## 2023-04-06 NOTE — Unmapped (Signed)
Blood Culture #1  Order: [id number]   Status: Final result    Blood Culture #2  Order: [id number]   Status: Final result      Final results reviewed. Both blood cultures are negative. No further action needed.

## 2023-04-07 ENCOUNTER — Ambulatory Visit: Admit: 2023-04-07 | Discharge: 2023-04-08 | Payer: PRIVATE HEALTH INSURANCE

## 2023-04-07 DIAGNOSIS — N23 Unspecified renal colic: Principal | ICD-10-CM

## 2023-04-07 DIAGNOSIS — N200 Calculus of kidney: Principal | ICD-10-CM

## 2023-04-07 DIAGNOSIS — N201 Calculus of ureter: Principal | ICD-10-CM

## 2023-04-07 LAB — URINALYSIS WITH MICROSCOPY WITH CULTURE REFLEX PERFORMABLE
BILIRUBIN UA: NEGATIVE
GLUCOSE UA: >1000 — AB
NITRITE UA: NEGATIVE
PH UA: 6.5 (ref 5.0–9.0)
PROTEIN UA: 30 — AB
RBC UA: >182 /HPF — ABNORMAL HIGH (ref <=4)
SPECIFIC GRAVITY UA: 1.017 (ref 1.003–1.030)
SQUAMOUS EPITHELIAL: 6 /HPF — ABNORMAL HIGH (ref 0–5)
UROBILINOGEN UA: <2
WBC UA: 11 /HPF — ABNORMAL HIGH (ref 0–5)

## 2023-04-07 MED ORDER — OXYBUTYNIN CHLORIDE ER 10 MG TABLET,EXTENDED RELEASE 24 HR
ORAL_TABLET | Freq: Every day | ORAL | 11 refills | 30 days | Status: CP
Start: 2023-04-07 — End: 2024-04-06

## 2023-04-07 MED ORDER — SILODOSIN 4 MG CAPSULE
ORAL | 3 refills | 0 days | Status: CP
Start: 2023-04-07 — End: ?

## 2023-04-07 MED ORDER — GABAPENTIN 300 MG CAPSULE
ORAL_CAPSULE | Freq: Three times a day (TID) | ORAL | 3 refills | 90 days | Status: CP
Start: 2023-04-07 — End: 2024-04-06

## 2023-04-07 NOTE — Unmapped (Signed)
Called patient and lvm with surgery date and instructions

## 2023-04-07 NOTE — Unmapped (Signed)
Assessment:   MS  Obesity  6 mm right mid ureteral stone    22 y.o. female w/ above medical history presents for discussion of management of her 6 mm right mid ureteral stone status post right ureteral stenting.    I discussed with the patient her imaging studies as well as her risk of stone progression.  I do think that the stone burden is likely too large to pass spontaneously on its own. Discussed risk, benefits and management options for nephrolithiasis including observation/medical expulsion therapy, extracorporeal shockwave lithotripsy (ESWL), ureteroscopy/holmium laser lithotripsy (URS), and percutaneous nephrolithotomy (PCNL). ESWL is less invasive but with a slightly lower stone free rate, URS is more invasive with a higher overall stone free rate, and PCNL also has a high stone free rate but increased morbidity.    Specific risks discussed:  1.  Observation - risks including stone growth, stone movement, pain, impact on renal function.  Chance of spontaneous stone passage of the largest stone:  0%     2.  Shockwave lithotripsy - risks of bleeding (1 in 1000 severe bleed), residual fragments, need for a secondary procedure.   Success based on size, location, hounsfield units and skin-to-stone distance     3.  Ureteroscopy - risks of UTI, ureteral injury (1 in 1000 severe injury requiring major surgery), morbidity from ureteral stent     4.  PCNL - risks of lung injury (1%), transfusion (5%), embolization (1%), injury to other organs, need for secondary procedure.    The patient elected to proceed with: ureteroscopic stone extraction and stent placement, which I think is reasonable.    Regarding ureteroscopy (URS):  We discussed the risks and benefits of the procedure as well as alternative surgical options for stone management (SWL/PCNL). The risks of ureteroscopy include but are not limited to infection, urosepsis, bleeding, transfusion, ureteral injury with need for nephrostomy tube or prolonged stent, other injury to urethra/bladder/ureter that could require open surgery to repair them either at the time of injury or in the future. We discussed that the ureters cross the iliac vessels and although unlikely, an injury to one of these vessels would likely require a transfusion and possibly open surgery for repair. The loss of a kidney is a rare but not unknown complication of endoscopic stone management. We discussed that sometimes it is not possible to safely gain access to the ureter and/or kidney. If this occurs, a stent is usually placed and the patient is brought back to the OR at a later date for definitive management. Other risks include conversion to open surgery, need for repeat or ancillary procedure, ureteral stricture, cardiac events/MI, DVT/PE, CVA/stroke, pneumonia, and death. We discussed the possibility of urinary pain or discomfort due to the stent. The stent is usually removed in the office, possibly with the use of a cystoscope. We discussed that management of this stone is not protective against future stones and that often metabolic evaluation and/or other stone risk studies should be performed to minimize the risk of future stone formation. The discussion was conducted in terms that the patient could understand. The patient expressed understanding and wishes to proceed.       All questions were answered.  The patient will be scheduled in near future for a preoperative evaluation with our anesthesia service.      Plan:  [ ]  Next available right ureteroscopy with laser lithotripsy, any provider; will aim with Dr. Gillermo Murdoch in cysto on 04/22/23  [ ]  UA reflex culture  [ ]   No additional labs  [ ]  Will Rx gabapentin 300 MG PO TID, refill Oxybutynin 10 MG XL daily for stent discomfort  [ ]  Will trial Silodosin given ?tachycardia with flomax      ===========================================================================================  CHIEF COMPLAINT: flank pain evaluation.    BRIEF HISTORY OF PRESENT ILLNESS:  Brandi Flynn is a 22 y.o. female with no  history of nephrolithiasis and other PMH s/f MS who was recently referred by Sherryl Barters for the evaluation of right 6mm mid ureteral stone.       03/26/2023: The patient was transferred from [place] with a CT confirmed Right 6mm Mid ureteral stone. They underwent Right ureteral stent placement. The patient tolerated the procedure well and had no complications during hospital course.      03/27/2023: At [place] ED - Improving RLQ abdominal pain s/p stent placement, but unrelieved by Tylenol and Toradol. Associated generalized body aches, localized to her extremities. She had one episode of urinary incontinence this morning.      03/30/2023: At [facility] ED - endorses epigastric pain, R flank pain, back pain, lightheadedness, diminished PO intake and intermittent hematuria since the procedure. X2 NBNB emesis which began while at rest. Ucx E.Coli given cefdinir 600 mg qday.     04/07/2023: MET     In the interim She notes significant discomfort likely from stent.  She has been taking toradol/APAP with little benefit.  Also previously on oxybutynin/flomax with little effect; had to stop flomax owing to orthostatic hypotension.  She denies anticoagulant use.  She admits to history of urinary tract infections.  She admits night time temperature elevations of 99.80F; also endorses chills/hematuria.  Also nausea.    CT abdomen pelvis 03/30/19:      Urine Culture, Comprehensive 10,000 to 50,000 CFU/mL Escherichia coli Abnormal       10,000 to 50,000 CFU/mL Mixed Urogenital Flora Abnormal          Specimen Source: Clean Catch        Resulting Agency: [facility]     Susceptibility     Escherichia coli     MIC SUSCEPTIBILITY RESULT     Ampicillin Susceptible     Cefazolin Susceptible     Cephalexin Susceptible 1     Ciprofloxacin Susceptible     Gentamicin Susceptible     Levofloxacin Susceptible     Nitrofurantoin Susceptible     Piperacillin + Tazobactam Susceptible     Tetracycline Susceptible 2     Tobramycin Susceptible     Trimethoprim + Sulfamethoxazole Susceptible              PAST MEDICAL HISTORY:     Past Medical History:   Diagnosis Date    Disease of thyroid gland     Multiple sclerosis (CMS-HCC)     Obesity     Vertigo        PAST SURGICAL HISTORY:   Past Surgical History:   Procedure Laterality Date    PR CYSTOSCOPY,INSERT URETERAL STENT Right 03/26/2023    Procedure: CYSTOURETHROSCOPY,  WITH INSERTION OF INDWELLING URETERAL STENT (EG, GIBBONS OR DOUBLE-J TYPE);  Surgeon: Annye English, MD;  Location: MAIN OR [facility];  Service: Urology    PR CYSTOURETHROSCOPY,URETER CATHETER Right 03/26/2023    Procedure: CYSTOURETHROSCOPY, W/URETERAL CATHETERIZATION, W/WO IRRIG, INSTILL, OR URETEROPYELOG, EXCLUS OF RADIOLG SVC;  Surgeon: Annye English, MD;  Location: MAIN OR [facility];  Service: Urology       ALLERGIES:    is allergic to blueberry  and peanut.    MEDICATIONS:  Current Outpatient Medications   Medication Sig Dispense Refill    baclofen (LIORESAL) 10 MG tablet Take 1 tablet (10 mg total) by mouth daily. (Patient not taking: Reported on 03/25/2023) 30 tablet 1    carboxymethylcellulose sodium (REFRESH CELLUVISC) 1 % DpGe Administer 1 drop to both eyes four (4) times a day. 1 each 0    cefdinir (OMNICEF) 300 MG capsule Take 2 capsules (600 mg total) by mouth daily for 10 days. 20 capsule 0    cetirizine (ZYRTEC) 10 MG tablet Take 1 tablet (10 mg total) by mouth daily as needed for allergies. 30 tablet 0    fluconazole (DIFLUCAN) 150 MG tablet Take 1 tablet (150 mg total) by mouth once a week for 6 doses. 6 tablet 0    loperamide (IMODIUM A-D) 2 mg tablet Take 1 tablet (2 mg total) by mouth four (4) times a day as needed for diarrhea. 30 tablet 0    nystatin (MYCOSTATIN) 100,000 unit/gram powder Apply 1 Application topically two (2) times a day. 60 g 1    oxybutynin (DITROPAN) 5 MG tablet Take 1 tablet (5 mg total) by mouth Three (3) times a day for 5 days. 15 tablet 0    tamsulosin (FLOMAX) 0.4 mg capsule Take 1 capsule (0.4 mg total) by mouth daily. 30 capsule 0    tamsulosin (FLOMAX) 0.4 mg capsule Take 1 capsule (0.4 mg total) by mouth daily. 30 capsule 0    teriflunomide 14 mg Tab Take 1 tablet (14 mg total) by mouth in the morning. 30 tablet 5     No current facility-administered medications for this visit.        SOCIAL HISTORY:  Social History     Tobacco Use    Smoking status: Every Day     Types: e-Cigarettes    Smokeless tobacco: Never    Tobacco comments:     Vape   Vaping Use    Vaping status: Never Used   Substance Use Topics    Alcohol use: Not Currently    Drug use: Never        FAMILY HISTORY:  Positive for kidney stones disease.  Family History   Problem Relation Age of Onset    Thyroid disease Mother     Thyroid disease Maternal Grandmother     Thyroid disease Maternal Grandfather     Thyroid disease Paternal Grandmother     Vitiligo Paternal Grandmother        REVIEW OF SYSTEMS:  A 10-system review of systems was completed by the patient and reviewed by me today.  All pertinent positives were discussed in history of present illness.    PHYSICAL EXAMINATION:  VITAL SIGNS:  The patient is afebrile.  Vital signs are stable.  GENERAL:  In no apparent distress.  HEENT:  Eyes, ears and mouth appeared normal.  LUNGS:  Chest wall excursion symmetric bilaterally. No audible wheezing  BACK:  Positive CVA tenderness.  EXTREMITIES:  No lower extremity edema.  SKIN:  No rashes or jaundice.  NEUROLOGIC:  No focal deficits on neurologic exam.      Labs:   Lab Results   Component Value Date    WBC 13.0 (H) 03/30/2023    HGB 15.4 (H) 03/30/2023    HCT 46.0 (H) 03/30/2023    PLT 258 03/30/2023     Lab Results   Component Value Date    NA 140 03/30/2023    K 3.9 03/30/2023  CL 104 03/30/2023    CO2 28.9 03/30/2023    BUN 8 (L) 03/30/2023    CREATININE 0.72 03/30/2023    GLU 105 03/30/2023    CALCIUM 9.9 03/30/2023    MG 1.7 03/26/2023    PHOS 2.2 (L) 03/26/2023     Lab Results   Component Value Date    BILITOT 0.6 03/30/2023    BILIDIR 0.20 03/17/2023    PROT 8.0 03/30/2023    ALBUMIN 4.1 03/30/2023    ALT 60 (H) 03/30/2023    AST 45 (H) 03/30/2023    ALKPHOS 60 03/30/2023     No results found for: LABPROT, INR, APTT    Imaging: I have personally reviewed CT abdomen pelvis  CT Abdomen Pelvis W IV Contrast Only    Result Date: 03/31/2023  EXAM: CT ABDOMEN PELVIS W CONTRAST ACCESSION: [id number] UN CLINICAL INDICATION: 22 years old with 16 F, hx R ureteral stent, acute progressive R flank pain, epigastric pain, leukocytosis r/o abscess, pyelo      COMPARISON: 03/23/2023     TECHNIQUE: A helical CT scan of the abdomen and pelvis was obtained following IV contrast from the lung bases through the pubic symphysis. Images were reconstructed in the axial plane. Coronal and sagittal reformatted images were also provided for further evaluation.         FINDINGS:     LOWER CHEST: Unremarkable.     LIVER: Normal liver contour. Diffuse hepatic steatosis. No focal liver lesions.     BILIARY: The gallbladder is normal in appearance. No biliary ductal dilatation.      SPLEEN: Normal in size and contour.     PANCREAS: Normal pancreatic contour.  No focal lesions.  No ductal dilation.     ADRENAL GLANDS: Normal appearance of the adrenal glands.     KIDNEYS/URETERS: Interval placement of a right double-J ureteral stent. No abnormal ureteral enhancement. No hydronephrosis. No solid renal mass.     BLADDER: Underdistended, limiting evaluation.     REPRODUCTIVE ORGANS: Anteverted uterus.     GI TRACT: No findings of bowel obstruction or acute inflammation. Normal appendix.     PERITONEUM, RETROPERITONEUM AND MESENTERY: No free air.  No ascites.  No fluid collection.     LYMPH NODES: No adenopathy.     VESSELS: Hepatic and portal veins are patent.  Normal caliber aorta.      BONES and SOFT TISSUES: No aggressive osseous lesions. Trace retrolisthesis of L5 on S1. Tiny fat-containing umbilical hernia.             Post right double-J ureteral stent placement without acute intra-abdominal or pelvic findings.     Hepatic steatosis.         XR Abdomen 2 Views and Chest 1 View    Result Date: 03/27/2023  EXAM: XR ABDOMEN 2 VIEWS AND CHEST 1 VIEW ACCESSION: [id number] UN     CLINICAL INDICATION: 22 years old with ABDOMINAL PAIN  -  UNSPECIFIED SITE      COMPARISON: December 30, 2022 chest radiograph     TECHNIQUE: PA view of the chest as well as upright and supine views of the abdomen.     FINDINGS:     Right double-J ureteral stent in place projecting over expected location of the right renal pelvis, right ureter, and bladder. Paucity of bowel gas with scattered gas and stool throughout the colon. No focal pulmonary opacities in the lungs. No other soft tissue abnormalities.         Right double-J  ureteral stent is projecting over expected location.    FL Fluoro < 60 Minutes (No Interp)    Result Date: 03/26/2023  Fluoroscopy was rendered to the performing Physician. The procedure will be resulted in HIM.    ECG 12 Lead    Result Date: 03/25/2023  NORMAL SINUS RHYTHM NORMAL ECG WHEN COMPARED WITH ECG OF 30-Dec-2022 14:16, NO SIGNIFICANT CHANGE WAS FOUND Confirmed by Eldred Manges [phone]) on 03/25/2023 3:34:22 PM    CT Abdomen Pelvis W IV Contrast Only    Result Date: 03/23/2023  EXAM: CT ABDOMEN PELVIS W CONTRAST ACCESSION: [id number] UN CLINICAL INDICATION: 22 years old with RLQ abd pain      COMPARISON: None     TECHNIQUE: A helical CT scan of the abdomen and pelvis was obtained following IV contrast from the lung bases through the pubic symphysis. Images were reconstructed in the axial plane. Coronal and sagittal reformatted images were also provided for further evaluation.         FINDINGS:     LOWER CHEST: Unremarkable.     LIVER: Normal liver contour. Diffuse hepatic steatosis. No focal liver lesions.     BILIARY: The gallbladder is normal in appearance. No biliary ductal dilatation.      SPLEEN: Normal in size and contour.     PANCREAS: Normal pancreatic contour.  No focal lesions.  No ductal dilation.     ADRENAL GLANDS: Normal appearance of the adrenal glands.     KIDNEYS/URETERS: 6 x 4 mm calculus in the proximal to mid right ureter, resulting in mild right hydronephrosis. Left kidney enhances normally.     BLADDER: Underdistended, limiting evaluation.     REPRODUCTIVE ORGANS: Unremarkable.     GI TRACT: No findings of bowel obstruction or acute inflammation. Normal appendix.     PERITONEUM, RETROPERITONEUM AND MESENTERY: No free air.  No ascites.  No fluid collection.     LYMPH NODES: No adenopathy.     VESSELS: Hepatic and portal veins are patent.  Normal caliber aorta.      BONES and SOFT TISSUES: No aggressive osseous lesions. Tiny fat-containing umbilical hernia.             6 x 4 mm proximal to mid right ureteral calculus with mild resultant right hydronephrosis.     Hepatic steatosis.         ED POCUS    Result Date: 03/21/2023  Patient declined study.      Interpreted by: Karie Mainland, MD Quality Assurance  After review of the point-of-care ultrasound performed in this case I assess the overall image quality as: Image quality: No recognizable structures, no objective data can be gathered  The accuracy of interpretation of images as presented reflects a technically limited study.     This study does not meet minimum criteria for credentialing and billing.     Luther Redo, MD         MRI Cervical Spine W Wo Contrast    Result Date: 02/12/2023  EXAM: Magnetic resonance imaging, spinal canal and contents, cervical without and with contrast material. DATE: 02/12/2023 5:11 AM ACCESSION: [id number] UN DICTATED: 02/12/2023 5:40 AM INTERPRETATION LOCATION: [facility]     CLINICAL INDICATION: 22 years old Female with hx of ms; blurry vision, extremity pain. Concern for MS flare     COMPARISON: Concurrent MRI brain. MRI total spine 01/01/2023     TECHNIQUE: Multiplanar MRI was performed through the cervical spine without and with intravenous contrast.  FINDINGS: Bone marrow signal intensity is normal. T1 and T2 hyperintense focus in the left aspect of the C2 vertebral body measures 0.7 cm, likely representing osseous hemangioma versus fat island (1:11), unchanged. Normal signal in the spinal cord. There is no abnormal enhancement.     There is straightening of normal cervical lordosis. Vertebral bodies are normally aligned. Disc spaces are preserved. No significant spinal canal or neural foraminal narrowing. Similar mild disc bulging at C3-C4, C4-C5, and C5-C6.     The paraspinal tissues are within normal limits.         No signal abnormalities within the cervical spinal cord.     Additional chronic/incidental findings as above.         MRI Brain W Wo Contrast    Result Date: 02/12/2023  EXAM: Magnetic resonance imaging, brain without and with contrast material. DATE: 02/12/2023 ACCESSION: [id number] UN DICTATED: 02/12/2023 4:59 AM INTERPRETATION LOCATION: [facility]     CLINICAL INDICATION: 22 years old Female with ms flare      COMPARISON: Concurrent MRI cervical spine. Total spine MRI 01/01/2023.     TECHNIQUE: Multiplanar, multisequence MR imaging of the brain was performed with and without I.V. contrast using our Multiple Sclerosis protocol.     FINDINGS: There are multiple foci of T2/FLAIR hyperintensity in the juxtacortical and periventricular white matter which appear unchanged. Some of the lesions demonstrate corresponding T1 black holes consistent with axonal loss. No enhancing lesions.     The optic nerves are normal in appearance. No diffuse brain volume loss. Ventricles are normal in size.     There is no evidence of intracranial hemorrhage, acute infarct, or mass.       Mucosal thickening of bilateral maxillary and ethmoid sinuses.         Multiple white matter lesions in a distribution suggestive of multiple sclerosis, unchanged. No contrast enhancement to suggest active demyelinating disease.    MRI Brain Wo Contrast    Result Date: 02/11/2023  EXAM: Magnetic resonance imaging, brain, without contrast material. DATE: 02/11/2023 9:15 PM ACCESSION: [id number] UN DICTATED: 02/11/2023 9:29 PM INTERPRETATION LOCATION: [facility]     CLINICAL INDICATION: 22 years old Female with hx of ms      COMPARISON: MRI brain dated 01/01/2023     TECHNIQUE: Multiplanar, multisequence MR imaging of the brain was performed without I.V. contrast.     FINDINGS: Limited sequences were obtained due to patient refused further imaging. Within this limitation; There are multiple foci of T2/FLAIR hyperintensity in the infratentorial, juxtacortical and periventricular white matter which appear unchanged. Some of the lesions demonstrate corresponding T1 black holes consistent with axonal loss. No new lesions.     The optic nerves are not evaluated due to absence of dedicated orbital sequences. No diffuse brain volume loss. Ventricles are normal in size.     There is no evidence of intracranial hemorrhage, acute infarct, or mass.       Mucosal thickening of bilateral maxillary and ethmoid sinuses with small volume layering fluid within the right maxillary sinus.         Limited sequences were obtained due to patient's refusal of further imaging. Within this limitation; Multiple white matter lesions in a distribution suggestive of multiple sclerosis, unchanged.    PVL Venous Duplex Lower Extremity Bilateral    Result Date: 01/11/2023   Peripheral Vascular Lab     [address]   [place], [place] [address]  PVL VENOUS DUPLEX LOWER EXTREMITY BILATERAL Patient Demographics Pt.  Name: ODELIA SIGUR Location: [place] [facility] MRN:      [id number]    Sex:      F DOB:      12/06/2000        Age:      22 years  Study Information Authorizing         [id number] Salvatore Decent       Performed Time       01/11/2023 6:24:27 Provider Name       TUCKER                                     AM Ordering Physician  Gwenyth Bender       Patient Location     [facility] Accession Number    [id number] UN         Technologist         Marissa                                                                Pachlhofer RVT Diagnosis:                                Assisting                                           Technologist Ordered Reason For Exam: L LE pain and swelling Indication: left foot swelling and pain Risk Factors: Hospitalization. Anticoagulation: (Lovenox). Protocol The major deep veins from the inguinal ligament to the ankle are assessed for bilaterally for compressibility and color and spectral Doppler flow characteristics. The assessed veins include bilateral common femoral vein, femoral vein in the thigh, popliteal vein, and intramuscular calf veins. The iliac vein is assessed indirectly using Doppler waveform analysis. The great saphenous vein is assessed for compressibility at the saphenofemoral junction, and the small saphenous vein assessed for compressibility behind the knee.  Final Interpretation Right There is no evidence of DVT in the lower extremity. There is no evidence of obstruction proximal to the inguinal ligament or in the common femoral vein. Left There is no evidence of DVT in the lower extremity. There is no evidence of obstruction proximal to the inguinal ligament or in the common femoral vein.  Electronically signed by [id number] Jodell Cipro MD on 01/11/2023 at 10:58:17 AM.  -------------------------------------------------------------------------------- Right Duplex Findings All veins visualized appear fully compressible. Doppler flow signals demonstrate normal spontaneity, phasicity, and augmentation.  Left Duplex Findings All veins visualized appear fully compressible. Doppler flow signals demonstrate normal spontaneity, phasicity, and augmentation. Right Technical Summary No evidence of deep venous obstruction in the lower extremity. No indirect evidence of obstruction proximal to the inguinal ligament. Left Technical Summary No evidence of deep venous obstruction in the lower extremity. No indirect evidence of obstruction proximal to the inguinal ligament.  Final        Candace Cruise. Dow Adolph, MD MPH  Assistant Professor  Department of Urology   [id number]  [place], [place] [address]  p [phone] f  [phone]

## 2023-04-08 LAB — CBC W/ AUTO DIFF
BASOPHILS ABSOLUTE COUNT: 0.1 10*9/L (ref 0.0–0.1)
BASOPHILS RELATIVE PERCENT: 0.8 %
EOSINOPHILS ABSOLUTE COUNT: 0.3 10*9/L (ref 0.0–0.5)
EOSINOPHILS RELATIVE PERCENT: 2.3 %
HEMATOCRIT: 45.2 % — ABNORMAL HIGH (ref 34.0–44.0)
HEMOGLOBIN: 15.2 g/dL — ABNORMAL HIGH (ref 11.3–14.9)
LYMPHOCYTES ABSOLUTE COUNT: 2.4 10*9/L (ref 1.1–3.6)
LYMPHOCYTES RELATIVE PERCENT: 19.2 %
MEAN CORPUSCULAR HEMOGLOBIN CONC: 33.6 g/dL (ref 32.3–35.0)
MEAN CORPUSCULAR HEMOGLOBIN: 29.5 pg (ref 25.9–32.4)
MEAN CORPUSCULAR VOLUME: 87.9 fL (ref 77.6–95.7)
MEAN PLATELET VOLUME: 9.2 fL (ref 7.3–10.7)
MONOCYTES ABSOLUTE COUNT: 1.1 10*9/L — ABNORMAL HIGH (ref 0.3–0.8)
MONOCYTES RELATIVE PERCENT: 8.5 %
NEUTROPHILS ABSOLUTE COUNT: 8.7 10*9/L — ABNORMAL HIGH (ref 1.5–6.4)
NEUTROPHILS RELATIVE PERCENT: 69.2 %
NUCLEATED RED BLOOD CELLS: 0 /100{WBCs} (ref <=4)
PLATELET COUNT: 287 10*9/L (ref 170–380)
RED BLOOD CELL COUNT: 5.14 10*12/L — ABNORMAL HIGH (ref 3.95–5.13)
RED CELL DISTRIBUTION WIDTH: 13.3 % (ref 12.2–15.2)
WBC ADJUSTED: 12.5 10*9/L — ABNORMAL HIGH (ref 4.2–10.2)

## 2023-04-08 LAB — URINALYSIS WITH MICROSCOPY WITH CULTURE REFLEX PERFORMABLE
BACTERIA: NONE SEEN /HPF
BILIRUBIN UA: NEGATIVE
GLUCOSE UA: NEGATIVE
KETONES UA: NEGATIVE
NITRITE UA: NEGATIVE
PH UA: 7 (ref 5.0–9.0)
PROTEIN UA: 200 — AB
RBC UA: >182 /HPF — ABNORMAL HIGH (ref <=4)
SPECIFIC GRAVITY UA: 1.022 (ref 1.003–1.030)
SQUAMOUS EPITHELIAL: 4 /HPF (ref 0–5)
UROBILINOGEN UA: <2
WBC UA: 89 /HPF — ABNORMAL HIGH (ref 0–5)

## 2023-04-08 LAB — COMPREHENSIVE METABOLIC PANEL
ALBUMIN: 4 g/dL (ref 3.4–5.0)
ALKALINE PHOSPHATASE: 64 U/L (ref 46–116)
ALT (SGPT): 53 U/L — ABNORMAL HIGH (ref 10–49)
ANION GAP: 7 mmol/L (ref 5–14)
AST (SGOT): 33 U/L (ref <=34)
BILIRUBIN TOTAL: 0.3 mg/dL (ref 0.3–1.2)
BLOOD UREA NITROGEN: 8 mg/dL — ABNORMAL LOW (ref 9–23)
BUN / CREAT RATIO: 15
CALCIUM: 10 mg/dL (ref 8.7–10.4)
CHLORIDE: 106 mmol/L (ref 98–107)
CO2: 28.6 mmol/L (ref 20.0–31.0)
CREATININE: 0.52 mg/dL — ABNORMAL LOW
EGFR CKD-EPI (2021) FEMALE: >90 mL/min/{1.73_m2} (ref >=60)
GLUCOSE RANDOM: 95 mg/dL (ref 70–179)
POTASSIUM: 3.8 mmol/L (ref 3.4–4.8)
PROTEIN TOTAL: 7.8 g/dL (ref 5.7–8.2)
SODIUM: 142 mmol/L (ref 135–145)

## 2023-04-08 LAB — PREGNANCY, URINE: PREGNANCY TEST URINE: NEGATIVE

## 2023-04-09 ENCOUNTER — Ambulatory Visit
Admit: 2023-04-09 | Discharge: 2023-04-09 | Disposition: A | Discharge status: Home / Self Care | Payer: PRIVATE HEALTH INSURANCE

## 2023-04-09 ENCOUNTER — Emergency Department
Admit: 2023-04-09 | Discharge: 2023-04-09 | Disposition: A | Discharge status: Home / Self Care | Payer: PRIVATE HEALTH INSURANCE

## 2023-04-09 MED ADMIN — sodium chloride (NS) 0.9 % infusion: 100 mL/h | INTRAVENOUS | @ 01:00:00

## 2023-04-09 MED ADMIN — ondansetron (ZOFRAN) injection 4 mg: 4 mg | INTRAVENOUS | @ 01:00:00 | Stop: 2023-04-08

## 2023-04-09 MED ADMIN — ondansetron (ZOFRAN) injection 4 mg: 4 mg | INTRAVENOUS | @ 03:00:00 | Stop: 2023-04-08

## 2023-04-09 MED ADMIN — ketorolac (TORADOL) injection 30 mg: 30 mg | INTRAMUSCULAR | @ 03:00:00 | Stop: 2023-04-08

## 2023-04-09 NOTE — Unmapped (Signed)
Urology Treatment Plan:    22 year old female with no prior history nephrolithiasis who presented serially to the [facility] ED with right flank pain related to 6 mm proximal ureteral stone.  She underwent right ureteral stent placement on 03/26/2023.  Appears to be currently treated with cefdinir for 10-50 K CFU E. coli on 03/30/2023 UCX.  This culture appropriate.  She presents to [facility] ED with worsening of her already present right-sided flank pain following stent placement.  She also endorses per ED team dysuria, nausea and vomiting.  She is able to tolerate p.o. intake.  Pain is poorly controlled with all medicines that she has currently.  UA obtained in the ED today demonstrates moderate leukocyte esterase, negative nitrite, large blood, 182 RBC/hpf, 89 WBC per hpf, 4 squamous, no bacteria.  Her WBC is 12.5 from 13.0.  Creatinine 0.52 from 0.72.  Hemoglobin stable.  Urine culture is currently pending.  She was seen on 04/07/2023 and has scheduled right USE on 04/22/2023.      Recommendations:  -No acute urologic intervention recommended  -Continue cefdinir for UTI  -Obtain CT scan to confirm correct location of right ureteral stent  -Continue multimodal pain control, stent medications including cephalexin and oxybutynin, consider adding Pyridium  -Will message urology surgery schedulers to see if her USE can be expedited    Marcene Duos, MD, MPH  PGY-2

## 2023-04-09 NOTE — Unmapped (Signed)
Bed: 09  Expected date:   Expected time:   Means of arrival:   Comments:  ems

## 2023-04-09 NOTE — Unmapped (Signed)
Received sign out from previous resident    Illness Severity: stable  Patient Summary: Brandi Flynn is a 22 y.o. female past medical show notable for MS recently seen at [facility] ED for flank pain following a right ureteral stent placement secondary to ureteral lithiasis.  Worsening right flank pain and abdominal pain since then with multiple episodes of nonbloody nonbilious emesis, which have resolved upon arrival.  Normal renal function with creatinine at 0.52, no transaminitis.  Mild leukocytosis of 12.5, with otherwise normal labs.  Urine redemonstrated infection, though patient had her biotics changed yesterday by urology, and is currently on a course of cefdinir.  Given Toradol for pain and Zofran for nausea.  Pending KUB to evaluate stent placement and right upper quadrant ultrasound to rule out lithiasis/cholecystitis.  Action List:   Results of KUB and RUQ ultrasound  Disposition: TBD       ED Course as of 04/09/23 0227   Sat Apr 09, 2023   0026 KUB with your unchanged position of ureteral stent when compared to previous CT.  Right upper quadrant ultrasound limited secondary to body habitus, though no overt signs of cholelithiasis or cholecystitis.  Will p.o. challenge, and if patient continues remain well-appearing, will plan for discharge.   1610 Patient p.o. challenge, tolerated without difficulty.  Reassuring KUB and right upper quadrant ultrasound, appropriate for outpatient follow-up with urology.

## 2023-04-09 NOTE — Unmapped (Signed)
[facility]  Emergency Department Provider Note     HPI     Brandi Flynn is a very pleasant 22 y.o. female  has a past medical history of Disease of thyroid gland, Multiple sclerosis (CMS-HCC), Obesity, and Vertigo.    The patient presents to the ED today for evaluation of right sided flank pain and centralized abdominal pain since 03/13/2023, as well as multiple NBNB emesis episodes today, which have currently resolved. She endorses dysuria, as well as difficulty urinating. She endorses low-grade fevers (Tmax 99.27F). Per chart review, the patient was seen in the [facility] ED on 03/30/2023, for flank pain in the setting of undergoing a right urethral stent placement on 03/26/2023, for a right 6mm mid-urethral stone. She was also diagnosed with a UTI and prescribed an antibiotic. She had an appointment with Urology yesterday for discussion of management of her stone, as she has been unable to pass it on her own. Currently, she states her pain has not improved despite the stent placement, and she is struggling to ambulate as a result.        MDM     ED Course as of 04/08/23 2328   Fri Apr 08, 2023   2253 Hemodynamically stable. On exam, patient is overall well-appearing in no acute distress.  No murmurs.  Lungs are CTA bilaterally.  Abdomen soft with tenderness to palpation right upper quadrant, positive CVA tenderness on the right.  No lower extremity edema.  Differential diagnosis includes but is not limited to recurrent kidney stone, renal colic, stent migration, pyelonephritis, cholecystitis, cholelithiasis among others.  Urinalysis notable for hematuria, moderate leukocyte esterase, 89 WBCs.  Patient has been taking cefdinir and just refilled another dose per her urologist, last urine culture showed susceptibility to cephalosporins, will defer further IV treatment at this time and patient instructed to continue cefdinir.  Creatinine at baseline.  Discussed case with urology who states they are unable to perform lithotripsy over the weekend and the patient is currently scheduled for 04/22/2023 however they will see if they can push up this procedure.  They recommend imaging to evaluate right ureteral stent.   2258 RN reported Zofran was given through IV however IV infiltrated.  Will reorder dissolvable Zofran as well as IM Toradol.  Patient signed out to oncoming team pending HyperCard ultrasound, abdomen x-ray and pain/nausea management         The case was discussed with the attending physician who is in agreement with the above assessment and plan.    - Any discussion of this patient's case/presentation between myself and consultants, admitting teams, or other team members has been documented above.  - Imaging and other studies, if performed, that were available during my care of the patient were independently reviewed and interpreted by me and considered in my medical decision making as documented above.  - External records reviewed: N/A  - Consideration of admission, observation, transfer, or escalation of care:      ED Clinical Impression     Final diagnoses:   Right flank pain (Primary)        Physical Exam     Vitals:    04/08/23 2029   BP: 156/107   Pulse: 103   Resp: 20   Temp: 36.9 ??C (98.4 ??F)   TempSrc: Oral   SpO2: 95%        Constitutional: Well-appearing, in no acute distress.   Eyes: Conjunctivae are normal. EOMI.   HEENT: Normocephalic and atraumatic. Mucous membranes are  moist.   Neck: Full active ROM  Cardiovascular: See heart rate listed above.  No murmurs appreciated.  Normal skin perfusion.   Respiratory: See respiratory rate listed above.  Lungs are clear to auscultation bilaterally.  Speaking easily in full sentences  Gastrointestinal: Soft, non-distended, right upper quadrant tenderness to palpation with positive CVA tenderness on the right, no rebound or guarding.  Musculoskeletal: No long bone deformities.   Neurologic: Normal speech and language. No gross focal neurologic deficits are appreciated.   Skin: Skin is warm, dry and intact.  Psychiatric: Mood and affect are normal.     Past History     PAST MEDICAL HISTORY/PAST SURGICAL HISTORY:   Past Medical History:   Diagnosis Date    Disease of thyroid gland     Multiple sclerosis (CMS-HCC)     Obesity     Vertigo        Past Surgical History:   Procedure Laterality Date    PR CYSTOSCOPY,INSERT URETERAL STENT Right 03/26/2023    Procedure: CYSTOURETHROSCOPY,  WITH INSERTION OF INDWELLING URETERAL STENT (EG, GIBBONS OR DOUBLE-J TYPE);  Surgeon: Annye English, MD;  Location: MAIN OR [facility];  Service: Urology    PR CYSTOURETHROSCOPY,URETER CATHETER Right 03/26/2023    Procedure: CYSTOURETHROSCOPY, W/URETERAL CATHETERIZATION, W/WO IRRIG, INSTILL, OR URETEROPYELOG, EXCLUS OF RADIOLG SVC;  Surgeon: Annye English, MD;  Location: MAIN OR [facility];  Service: Urology       MEDICATIONS:     Current Facility-Administered Medications:     ondansetron (ZOFRAN-ODT) disintegrating tablet 4 mg, 4 mg, Oral, Once, Woodward Klem J, DO    oxyCODONE (ROXICODONE) immediate release tablet 5 mg, 5 mg, Oral, Once, Vonceil Upshur J, DO    sodium chloride (NS) 0.9 % infusion, 100 mL/hr, Intravenous, Continuous, Clovis Fredrickson, MD, Stopped at 04/08/23 2307    Current Outpatient Medications:     baclofen (LIORESAL) 10 MG tablet, Take 1 tablet (10 mg total) by mouth daily. (Patient not taking: Reported on 03/25/2023), Disp: 30 tablet, Rfl: 1    carboxymethylcellulose sodium (REFRESH CELLUVISC) 1 % DpGe, Administer 1 drop to both eyes four (4) times a day., Disp: 1 each, Rfl: 0    cefdinir (OMNICEF) 300 MG capsule, Take 2 capsules (600 mg total) by mouth daily for 10 days., Disp: 20 capsule, Rfl: 0    cetirizine (ZYRTEC) 10 MG tablet, Take 1 tablet (10 mg total) by mouth daily as needed for allergies., Disp: 30 tablet, Rfl: 0    cholecalciferol, vitamin D3 25 mcg, 1,000 units,, (CHOLECALCIFEROL-25 MCG, 1,000 UNIT,) 1,000 unit (25 mcg) tablet, Take 1 tablet (25 mcg total) by mouth daily., Disp: , Rfl:     fluconazole (DIFLUCAN) 150 MG tablet, Take 1 tablet (150 mg total) by mouth once a week for 6 doses. (Patient not taking: Reported on 04/07/2023), Disp: 6 tablet, Rfl: 0    gabapentin (NEURONTIN) 300 MG capsule, Take 1 capsule (300 mg total) by mouth Three (3) times a day., Disp: 270 capsule, Rfl: 3    loperamide (IMODIUM A-D) 2 mg tablet, Take 1 tablet (2 mg total) by mouth four (4) times a day as needed for diarrhea., Disp: 30 tablet, Rfl: 0    nystatin (MYCOSTATIN) 100,000 unit/gram powder, Apply 1 Application topically two (2) times a day., Disp: 60 g, Rfl: 1    oxybutynin (DITROPAN XL) 10 MG 24 hr tablet, Take 1 tablet (10 mg total) by mouth daily., Disp: 30 tablet, Rfl: 11    silodosin 4 mg cap,  Take 4 mg by mouth in the morning. May make you feel lightheaded.., Disp: 30 capsule, Rfl: 3    teriflunomide 14 mg Tab, Take 1 tablet (14 mg total) by mouth in the morning., Disp: 30 tablet, Rfl: 5    ALLERGIES:   Blueberry and Peanut    SOCIAL HISTORY:   Social History     Tobacco Use    Smoking status: Former     Types: e-Cigarettes    Smokeless tobacco: Never    Tobacco comments:     Vape   Substance Use Topics    Alcohol use: Not Currently       FAMILY HISTORY:  Family History   Problem Relation Age of Onset    Thyroid disease Mother     Thyroid disease Maternal Grandmother     Thyroid disease Maternal Grandfather     Thyroid disease Paternal Grandmother     Vitiligo Paternal Grandmother          Radiology     ED POCUS    (Results Pending)   [place] RUQ W Gallbladder    (Results Pending)   XR Abdomen 1 View    (Results Pending)        Laboratory Data     Lab Results   Component Value Date    WBC 12.5 (H) 04/08/2023    HGB 15.2 (H) 04/08/2023    HCT 45.2 (H) 04/08/2023    PLT 287 04/08/2023       Lab Results   Component Value Date    NA 142 04/08/2023    K 3.8 04/08/2023    CL 106 04/08/2023    CO2 28.6 04/08/2023    BUN 8 (L) 04/08/2023    CREATININE 0.52 (L) 04/08/2023    GLU 95 04/08/2023    CALCIUM 10.0 04/08/2023    MG 1.7 03/26/2023    PHOS 2.2 (L) 03/26/2023       Lab Results   Component Value Date    BILITOT 0.3 04/08/2023    BILIDIR 0.20 03/17/2023    PROT 7.8 04/08/2023    ALBUMIN 4.0 04/08/2023    ALT 53 (H) 04/08/2023    AST 33 04/08/2023    ALKPHOS 64 04/08/2023       No results found for: LABPROT, INR, APTT      Portions of this record have been created using Scientist, clinical (histocompatibility and immunogenetics). Dictation errors have been sought, but may not have been identified and corrected.    Documentation assistance was provided by Ricka Burdock, Scribe on April 08, 2023 at 22:06 for Kristopher Glee, DO.     I attest that I have reviewed the scribe's note and that the components of the history of the present illness, the physical exam, and the assessment and plan documented were performed by me or were performed in my presence by the scribe where I verified the documentation and performed (or re-performed) the exam and medical decision making.     Kristopher Glee, DO  Emergency Medicine, PGY-2       Corinna Lines, DO  Resident  04/08/23 [phone]

## 2023-04-09 NOTE — Unmapped (Signed)
BIBOCEMS from home, pt with known kidney stone scheduled for surgery 7/26    Patient presents for unbearable pain, N/V, and low grade fever that has worsened since last night. Pt appears uncomfortable. Was instructed to return if symptoms worsened    Took tylenol around 1800

## 2023-04-10 LAB — COMPREHENSIVE METABOLIC PANEL
ALBUMIN: 3.8 g/dL (ref 3.4–5.0)
ALKALINE PHOSPHATASE: 56 U/L (ref 46–116)
ALT (SGPT): 43 U/L (ref 10–49)
ANION GAP: 9 mmol/L (ref 5–14)
AST (SGOT): 29 U/L (ref <=34)
BILIRUBIN TOTAL: 0.5 mg/dL (ref 0.3–1.2)
BLOOD UREA NITROGEN: 7 mg/dL — ABNORMAL LOW (ref 9–23)
BUN / CREAT RATIO: 12
CALCIUM: 9.6 mg/dL (ref 8.7–10.4)
CHLORIDE: 107 mmol/L (ref 98–107)
CO2: 28.1 mmol/L (ref 20.0–31.0)
CREATININE: 0.59 mg/dL
EGFR CKD-EPI (2021) FEMALE: >90 mL/min/{1.73_m2} (ref >=60)
GLUCOSE RANDOM: 78 mg/dL (ref 70–179)
POTASSIUM: 3.8 mmol/L (ref 3.4–4.8)
PROTEIN TOTAL: 7.2 g/dL (ref 5.7–8.2)
SODIUM: 144 mmol/L (ref 135–145)

## 2023-04-10 LAB — URINALYSIS WITH MICROSCOPY WITH CULTURE REFLEX PERFORMABLE
BILIRUBIN UA: NEGATIVE
GLUCOSE UA: NEGATIVE
KETONES UA: NEGATIVE
NITRITE UA: NEGATIVE
PH UA: 6.5 (ref 5.0–9.0)
PROTEIN UA: 70 — AB
RBC UA: >182 /HPF — ABNORMAL HIGH (ref <=4)
SPECIFIC GRAVITY UA: 1.016 (ref 1.003–1.030)
SQUAMOUS EPITHELIAL: 2 /HPF (ref 0–5)
UROBILINOGEN UA: <2
WBC UA: 34 /HPF — ABNORMAL HIGH (ref 0–5)

## 2023-04-10 LAB — CBC W/ AUTO DIFF
BASOPHILS ABSOLUTE COUNT: 0.2 10*9/L — ABNORMAL HIGH (ref 0.0–0.1)
BASOPHILS RELATIVE PERCENT: 1.8 %
EOSINOPHILS ABSOLUTE COUNT: 0.3 10*9/L (ref 0.0–0.5)
EOSINOPHILS RELATIVE PERCENT: 2.4 %
HEMATOCRIT: 43 % (ref 34.0–44.0)
HEMOGLOBIN: 14.5 g/dL (ref 11.3–14.9)
LYMPHOCYTES ABSOLUTE COUNT: 3.2 10*9/L (ref 1.1–3.6)
LYMPHOCYTES RELATIVE PERCENT: 24.1 %
MEAN CORPUSCULAR HEMOGLOBIN CONC: 33.6 g/dL (ref 32.3–35.0)
MEAN CORPUSCULAR HEMOGLOBIN: 29.5 pg (ref 25.9–32.4)
MEAN CORPUSCULAR VOLUME: 87.7 fL (ref 77.6–95.7)
MEAN PLATELET VOLUME: 8.9 fL (ref 7.3–10.7)
MONOCYTES ABSOLUTE COUNT: 1.2 10*9/L — ABNORMAL HIGH (ref 0.3–0.8)
MONOCYTES RELATIVE PERCENT: 9.4 %
NEUTROPHILS ABSOLUTE COUNT: 8.2 10*9/L — ABNORMAL HIGH (ref 1.5–6.4)
NEUTROPHILS RELATIVE PERCENT: 62.3 %
NUCLEATED RED BLOOD CELLS: 0 /100{WBCs} (ref <=4)
PLATELET COUNT: 270 10*9/L (ref 170–380)
RED BLOOD CELL COUNT: 4.91 10*12/L (ref 3.95–5.13)
RED CELL DISTRIBUTION WIDTH: 13.1 % (ref 12.2–15.2)
WBC ADJUSTED: 13.1 10*9/L — ABNORMAL HIGH (ref 4.2–10.2)

## 2023-04-11 ENCOUNTER — Ambulatory Visit
Admit: 2023-04-11 | Discharge: 2023-04-13 | Disposition: A | Discharge status: Home / Self Care | Payer: PRIVATE HEALTH INSURANCE

## 2023-04-11 LAB — BASIC METABOLIC PANEL
ANION GAP: 6 mmol/L (ref 5–14)
BLOOD UREA NITROGEN: 12 mg/dL (ref 9–23)
BUN / CREAT RATIO: 17
CALCIUM: 9.2 mg/dL (ref 8.7–10.4)
CHLORIDE: 106 mmol/L (ref 98–107)
CO2: 31.2 mmol/L — ABNORMAL HIGH (ref 20.0–31.0)
CREATININE: 0.7 mg/dL
EGFR CKD-EPI (2021) FEMALE: >90 mL/min/{1.73_m2} (ref >=60)
GLUCOSE RANDOM: 126 mg/dL (ref 70–179)
POTASSIUM: 3.9 mmol/L (ref 3.5–5.1)
SODIUM: 143 mmol/L (ref 135–145)

## 2023-04-11 LAB — CBC
HEMATOCRIT: 39.5 % (ref 34.0–44.0)
HEMOGLOBIN: 13.2 g/dL (ref 11.3–14.9)
MEAN CORPUSCULAR HEMOGLOBIN CONC: 33.4 g/dL (ref 32.3–35.0)
MEAN CORPUSCULAR HEMOGLOBIN: 29.5 pg (ref 25.9–32.4)
MEAN CORPUSCULAR VOLUME: 88.3 fL (ref 77.6–95.7)
MEAN PLATELET VOLUME: 9.1 fL (ref 7.3–10.7)
PLATELET COUNT: 243 10*9/L (ref 170–380)
RED BLOOD CELL COUNT: 4.48 10*12/L (ref 3.95–5.13)
RED CELL DISTRIBUTION WIDTH: 13.6 % (ref 12.2–15.2)
WBC ADJUSTED: 9.6 10*9/L (ref 4.2–10.2)

## 2023-04-11 LAB — HCG QUANTITATIVE, BLOOD: GONADOTROPIN, CHORIONIC (HCG) QUANT: <2.6 m[IU]/mL

## 2023-04-11 MED ADMIN — ondansetron (ZOFRAN) injection 4 mg: 4 mg | INTRAVENOUS | @ 05:00:00 | Stop: 2023-04-11 | NDC 71930001752

## 2023-04-11 MED ADMIN — gabapentin (NEURONTIN) capsule 300 mg: 300 mg | ORAL | @ 13:00:00 | NDC 76420004790

## 2023-04-11 MED ADMIN — cefTRIAXone (ROCEPHIN) 1 g in sodium chloride 0.9 % (NS) 100 mL IVPB-MBP: 1 g | INTRAVENOUS | @ 04:00:00 | Stop: 2023-04-11 | NDC 68258901501

## 2023-04-11 MED ADMIN — acetaminophen (TYLENOL) tablet 650 mg: 650 mg | ORAL | @ 19:00:00 | NDC 50580048790

## 2023-04-11 MED ADMIN — acetaminophen (TYLENOL) tablet 650 mg: 650 mg | ORAL | @ 13:00:00 | NDC 50580048790

## 2023-04-11 MED ADMIN — gabapentin (NEURONTIN) capsule 300 mg: 300 mg | ORAL | @ 19:00:00 | NDC 76420004790

## 2023-04-11 MED ADMIN — ketorolac (TORADOL) injection 15 mg: 15 mg | INTRAVENOUS | @ 13:00:00 | Stop: 2023-04-16 | NDC 00409228831

## 2023-04-11 MED ADMIN — acetaminophen (TYLENOL) tablet 650 mg: 650 mg | ORAL | @ 07:00:00 | Stop: 2023-04-11 | NDC 50580048790

## 2023-04-11 MED ADMIN — oxybutynin (DITROPAN) tablet 5 mg: 5 mg | ORAL | @ 13:00:00 | NDC 64376040216

## 2023-04-11 MED ADMIN — lactated ringers bolus 1,000 mL: 1000 mL | INTRAVENOUS | @ 09:00:00 | Stop: 2023-04-11 | NDC 53191040901

## 2023-04-11 MED ADMIN — ketorolac (TORADOL) injection 15 mg: 15 mg | INTRAVENOUS | @ 04:00:00 | Stop: 2023-04-11 | NDC 00409228831

## 2023-04-11 MED ADMIN — sodium chloride 0.9% (NS) bolus 1,000 mL: 1000 mL | INTRAVENOUS | @ 04:00:00 | Stop: 2023-04-11 | NDC 53191040901

## 2023-04-11 NOTE — Unmapped (Addendum)
Pt arrived from ED with pyelonephritis and endorsing N/V/D. AxO 4. RA. Continent x2. Boyfriend arrived with pt. Received 1L bolus of LR in ED. No skin issues noted. Pt able to walk but uses rolling walker due to weakness. Walker given to pt. Pt states she hasn't been able to pick up the meds prescribed on 04/07/2023 due to associated pain. PRN tylenol given for pain. No current N/V/D.    Problem: Adult Inpatient Plan of Care  Goal: Plan of Care Review  Outcome: Ongoing - Unchanged  Goal: Patient-Specific Goal (Individualized)  Outcome: Ongoing - Unchanged  Goal: Absence of Hospital-Acquired Illness or Injury  Outcome: Ongoing - Unchanged  Goal: Optimal Comfort and Wellbeing  Outcome: Ongoing - Unchanged  Goal: Readiness for Transition of Care  Outcome: Ongoing - Unchanged  Goal: Rounds/Family Conference  Outcome: Ongoing - Unchanged

## 2023-04-11 NOTE — Unmapped (Signed)
ULTRASOUND PIV PROCEDURE NOTE    Indications:   Poor venous access.    Ultrasound guidance was necessary to obtain access.     Procedure Details:  Identity of the patient was confirmed via name, medical record number and date of birth. The availability of the correct equipment was verified.    The vein was identified and measured for ultrasound catheter insertion.       Vein measurement (without tourniquet):   0.53 cm   A(n) 20 gauge 1.75 catheter was selected based on the recommendations below:    [facility] Catheter/Vein Ratio Guidelines    Chart to determine PIV catheter size/length to use based on vein diameter and depth   Catheter Gauge Size (g)  22g 20g 18g   Catheter length (inches)  1.75 1.75 1.75   Catheter diameter measurement (mm) 0.9 mm 1.1 mm 1.3 mm          Minimum required vein diameter       Sonosite (cm)  0.27 cm 0.33 cm 0.39 cm          Maximum vein depth  1.25 cm 1.25 cm 1.25 cm          The field was prepared with necessary supplies and equipment.  Probe cover and sterile gel utilized. Insertion site was prepped with chlorhexidineand allowed to dry.  The catheter extension was primed with normal saline.  The [place] PIV was placed in the RAC with 1attempt(s). See LDA for additional details.    Catheter aspirated, 10 mL blood return present. The catheter was then flushed with 10 mL of normal saline. Insertion site cleansed, and dressing applied per manufacturer guidelines. The catheter was inserted with difficulty due to poor vasculature by Jacklyn Shell, RN.    Thank you,     Korah Hufstedler Montel Clock, RN    Ultrasound Resource Nurse    Workup / Procedure Time:  30 minutes

## 2023-04-11 NOTE — Unmapped (Signed)
UROLOGY CONSULT NOTE    Requesting Attending Physician:  Soyla Murphy, MD  Service Requesting Consult:  [facility])  Service Providing Consult: SRU  Consulting Attending: Dr. Dow Adolph    Assessment:  Patient is a 22 y.o. female with thyroid disease, MS, obesity, and recent right ureteral stent stone s/p stent placement on 03/26/2023 currently admitted for c/f pyelonephritis.     Urology consulted given recent stent placement.     Reassuringly, pt is HDS. Upon admission, had mild leukocytosis, now downtrending.   No concern for AKI, and KUB with stent in what appears to be appropriate position/  UA dirty - reflex culture pending. Currently receiving IV CTX.     Upon personal review of POCUS, no concern for hydronephrosis or stent malposition given lack of hydronephrosis.     No current urologic intervention indicated.  Pt will need to remain on culture-specific abx through time of surgery.   Reasonable to obtain formal RUS prior to discharge to better evaluate stent in kidney if pt clinically worsens.       Recommendations:  No acute urologic intervention  Follow-up urine culture  Pt should remain on culture specific abx through day of surgery  Low concern for bladder emptying 2/2 MS given under distended bladder on ultraound    Dr. Dow Adolph available.  Thank you for this consult. Please page [phone] with any questions or concerns.    History of Present Illness:   Brandi Flynn is seen in consultation for pyelonephritis at the request of Soyla Murphy, MD on the [facility]).     Briefly, pt is 35 YOF with ureteral stone s/p right ureteral stent placement on 03/26/2023.   Pt with definitive USE scheduled on 04/22/2023.     Pt currently admitted to hospital with c/f pyelonephritis given flank pain, N/V. Ultimately required admission due to inability to tolerate trial of PO antibiotics as outpt.     Pt is currently HDS. Upon admission, had mild leukocytosis, now downtrending.   No concern for AKI.   UA dirty - reflex culture pending. Currently receiving IV CTX.     Past Medical History:  Past Medical History:   Diagnosis Date    Disease of thyroid gland     Multiple sclerosis (CMS-HCC)     Obesity     Vertigo        Past Surgical History:   Past Surgical History:   Procedure Laterality Date    PR CYSTOSCOPY,INSERT URETERAL STENT Right 03/26/2023    Procedure: CYSTOURETHROSCOPY,  WITH INSERTION OF INDWELLING URETERAL STENT (EG, GIBBONS OR DOUBLE-J TYPE);  Surgeon: Annye English, MD;  Location: MAIN OR [facility];  Service: Urology    PR CYSTOURETHROSCOPY,URETER CATHETER Right 03/26/2023    Procedure: CYSTOURETHROSCOPY, W/URETERAL CATHETERIZATION, W/WO IRRIG, INSTILL, OR URETEROPYELOG, EXCLUS OF RADIOLG SVC;  Surgeon: Annye English, MD;  Location: MAIN OR [facility];  Service: Urology       Medication:  Current Facility-Administered Medications   Medication Dose Route Frequency Provider Last Rate Last Admin    acetaminophen (TYLENOL) tablet 650 mg  650 mg Oral Q6H Repko, Alexander J, DO   650 mg at 04/11/23 0903    cefTRIAXone (ROCEPHIN) 1 g in sodium chloride 0.9 % (NS) 100 mL IVPB-MBP  1 g Intravenous Q24H Repko, Alexander J, DO        cholecalciferol (vitamin D3 25 mcg (1,000 units)) tablet 25 mcg  25 mcg Oral Nightly Repko, Netta Neat, DO  enoxaparin (LOVENOX) syringe 40 mg  40 mg Subcutaneous Q12H Repko, Alexander J, DO        gabapentin (NEURONTIN) capsule 300 mg  300 mg Oral TID Repko, Alexander J, DO   300 mg at 04/11/23 1610    ketorolac (TORADOL) injection 15 mg  15 mg Intravenous Q6H PRN Repko, Alexander J, DO   15 mg at 04/11/23 9604    morphine injection 2 mg  2 mg Intravenous Q4H PRN Repko, Alexander J, DO        ondansetron [facility]) injection 4 mg  4 mg Intravenous Q6H PRN Repko, Alexander J, DO        oxybutynin (DITROPAN) tablet 5 mg  5 mg Oral BID Repko, Alexander J, DO   5 mg at 04/11/23 5409       Allergies:  Allergies   Allergen Reactions    Blueberry Hives    Peanut Swelling     Swelling tongue       Social History:  Social History     Tobacco Use    Smoking status: Former     Types: e-Cigarettes    Smokeless tobacco: Never    Tobacco comments:     Vape   Vaping Use    Vaping status: Never Used   Substance Use Topics    Alcohol use: Not Currently    Drug use: Never       Family History:  Family History   Problem Relation Age of Onset    Thyroid disease Mother     Thyroid disease Maternal Grandmother     Thyroid disease Maternal Grandfather     Thyroid disease Paternal Grandmother     Vitiligo Paternal Grandmother        Review of Systems:  10 systems were reviewed and are negative except as noted specifically in the HPI.    Objective:     Intake/Output last 3 shifts:  I/O last 3 completed shifts:  In: 100 [IV Piggyback:100]  Out: -   Vital signs in last 24 hours:  BP 135/75  - Pulse 83  - Temp 36.7 ??C (98 ??F) (Temporal)  - Resp 22  - Ht 160 cm (5' 3)  - Wt (!) 139.7 kg (308 lb)  - SpO2 98%  - BMI 54.56 kg/m??     Physical Exam:  General:  No acute distress, resting in bed  HEENT: Normocephalic, atraumatic, pupils equal and round, sclera anicteric  Neck  Trachea midline, symmetrical  Lungs:   Normal work of breathing on room air  Cardiac: Regular rate  Abdomen: Non tender, soft, non distended.  GU:  Mild right CVA tenderness. Voiding spontaneously  Extremities: Warm and well perfused  Neuro:             Alert and oriented, strength and sensation grossly normal      Most Recent Labs:  Recent Labs     Units 04/08/23  2056 04/10/23  2305 04/11/23  0730   WBC 10*9/L 12.5* 13.1* 9.6   RBC 10*12/L 5.14* 4.91 4.48   HGB g/dL 81.1* 91.4 78.2   HCT % 45.2* 43.0 39.5   MCV fL 87.9 87.7 88.3   MCH pg 29.5 29.5 29.5   MCHC g/dL 95.6 21.3 08.6   RDW % 13.3 13.1 13.6   PLT 10*9/L 287 270 243   MPV fL 9.2 8.9 9.1     Recent Labs     Units 04/08/23  2056 04/10/23  2305 04/11/23  0730  NA mmol/L 142 144 143   K mmol/L 3.8 3.8 3.9   CL mmol/L 106 107 106   CO2 mmol/L 28.6 28.1 31.2*   BUN mg/dL 8* 7* 12   CREATININE mg/dL 1.61* 0.96 0.45   GLU mg/dL 95 78 409     Recent Labs     Units 04/08/23  2056 04/10/23  2305   ALT U/L 53* 43   AST U/L 33 29   ALKPHOS U/L 64 56   ALBUMIN g/dL 4.0 3.8   PROT g/dL 7.8 7.2   BILITOT mg/dL 0.3 0.5     No results for input(s): INR, APTT in the last 168 hours.    Microbiology Data:  Blood Culture, Routine   Date Value Ref Range Status   03/27/2023 No Growth at 5 days  Final   03/27/2023 No Growth at 5 days  Final     Urine Culture, Comprehensive   Date Value Ref Range Status   04/08/2023 Mixed Urogenital Flora  Final   04/07/2023 Mixed Urogenital Flora  Final   03/30/2023 10,000 to 50,000 CFU/mL Escherichia coli (A)  Final   03/30/2023 10,000 to 50,000 CFU/mL Mixed Urogenital Flora (A)  Final   03/27/2023 Mixed Urogenital Flora  Final   03/25/2023 Mixed Urogenital Flora  Final   03/25/2023 Mixed Urogenital Flora  Final     No results found for: ANACX  Most recent Urinalysis:  Recent Labs     Units 04/07/23  0925 04/08/23  2058 04/10/23  2110   LEUKOCYTESUR  Moderate* Moderate* Large*   NITRITE  Negative Negative Negative   RBCUA /HPF >182* >182* >182*   WBCUA /HPF 11* 89* 34*   SQUEPIU /HPF 6* 4 2   BACTERIA /HPF Rare* None Seen Moderate*      Urinalysis History:  Leukocyte Esterase, UA   Date Value Ref Range Status   04/10/2023 Large (A) Negative Final   04/08/2023 Moderate (A) Negative Final   04/07/2023 Moderate (A) Negative Final   03/30/2023 Moderate (A) Negative Final   03/27/2023 Large (A) Negative Final   03/25/2023 Small (A) Negative Final   03/25/2023 Large (A) Negative Final     Leukocytes, UA   Date Value Ref Range Status   03/25/2023 Trace (A) Negative Final   01/25/2023 Negative Negative Final     Nitrite, UA   Date Value Ref Range Status   04/10/2023 Negative Negative Final   04/08/2023 Negative Negative Final   04/07/2023 Negative Negative Final   03/30/2023 Negative Negative Final   03/27/2023 Negative Negative Final   03/25/2023 Negative Negative Final   03/25/2023 Negative Negative Final   03/25/2023 Negative Negative Final   01/25/2023 Negative Negative Final     RBC, UA   Date Value Ref Range Status   04/10/2023 >182 (H) <=4 /HPF Final   04/08/2023 >182 (H) <=4 /HPF Final   04/07/2023 >182 (H) <=4 /HPF Final   03/30/2023 >182 (H) <=4 /HPF Final   03/27/2023 >182 (H) <=4 /HPF Final   03/25/2023 12 (H) <=4 /HPF Final   03/25/2023 16 (H) <=4 /HPF Final     WBC, UA   Date Value Ref Range Status   04/10/2023 34 (H) 0 - 5 /HPF Final   04/08/2023 89 (H) 0 - 5 /HPF Final   04/07/2023 11 (H) 0 - 5 /HPF Final   03/30/2023 7 (H) 0 - 5 /HPF Final   03/27/2023 16 (H) 0 - 5 /HPF Final   03/25/2023 20 (H)  0 - 5 /HPF Final   03/25/2023 51 (H) 0 - 5 /HPF Final     Squam Epithel, UA   Date Value Ref Range Status   04/10/2023 2 0 - 5 /HPF Final   04/08/2023 4 0 - 5 /HPF Final   04/07/2023 6 (H) 0 - 5 /HPF Final   03/30/2023 1 0 - 5 /HPF Final   03/27/2023 3 0 - 5 /HPF Final   03/25/2023 12 (H) 0 - 5 /HPF Final   03/25/2023 19 (H) 0 - 5 /HPF Final     Bacteria, UA   Date Value Ref Range Status   04/10/2023 Moderate (A) None Seen /HPF Final   04/08/2023 None Seen None Seen /HPF Final   04/07/2023 Rare (A) None Seen /HPF Final   03/30/2023 Rare (A) None Seen /HPF Final   03/27/2023 Moderate (A) None Seen /HPF Final   03/25/2023 None Seen None Seen /HPF Final   03/25/2023 Rare (A) None Seen /HPF Final        Imaging:  No results found.

## 2023-04-11 NOTE — Unmapped (Signed)
Pt admitted for pyelonephritis. VSS. Pain managed well with PRN. No complaints of nausea. Tolerating diet well. Family at bedside. All safety measures remain in place. Call bell in reach. Will continue plan of care.   Problem: Adult Inpatient Plan of Care  Goal: Plan of Care Review  Outcome: Progressing  Goal: Patient-Specific Goal (Individualized)  Outcome: Progressing  Goal: Absence of Hospital-Acquired Illness or Injury  Outcome: Progressing  Intervention: Identify and Manage Fall Risk  Recent Flowsheet Documentation  Taken 04/11/2023 1200 by Charlann Lange, RN  Safety Interventions:   fall reduction program maintained   lighting adjusted for tasks/safety   low bed   family at bedside  Taken 04/11/2023 1000 by Charlann Lange, RN  Safety Interventions:   fall reduction program maintained   family at bedside   lighting adjusted for tasks/safety   low bed  Taken 04/11/2023 0800 by Charlann Lange, RN  Safety Interventions:   fall reduction program maintained   family at bedside   lighting adjusted for tasks/safety   low bed  Intervention: Prevent Skin Injury  Recent Flowsheet Documentation  Taken 04/11/2023 1200 by Charlann Lange, RN  Positioning for Skin: Supine/Back  Taken 04/11/2023 1000 by Charlann Lange, RN  Positioning for Skin: Supine/Back  Taken 04/11/2023 0903 by Charlann Lange, RN  Positioning for Skin: Supine/Back  Taken 04/11/2023 0800 by Charlann Lange, RN  Positioning for Skin: Supine/Back  Intervention: Prevent and Manage VTE (Venous Thromboembolism) Risk  Recent Flowsheet Documentation  Taken 04/11/2023 1200 by Charlann Lange, RN  Anti-Embolism Intervention: Refused  Taken 04/11/2023 1000 by Charlann Lange, RN  Anti-Embolism Intervention: Refused  Taken 04/11/2023 0903 by Charlann Lange, RN  Anti-Embolism Intervention: Refused  Taken 04/11/2023 0800 by Charlann Lange, RN  Anti-Embolism Intervention: Refused  Goal: Optimal Comfort and Wellbeing  Outcome: Progressing  Goal: Readiness for Transition of Care  Outcome: Progressing  Goal: Rounds/Family Conference  Outcome: Progressing     Problem: Self-Care Deficit  Goal: Improved Ability to Complete Activities of Daily Living  Outcome: Progressing     Problem: Fall Injury Risk  Goal: Absence of Fall and Fall-Related Injury  Outcome: Progressing  Intervention: Promote Injury-Free Environment  Recent Flowsheet Documentation  Taken 04/11/2023 1200 by Charlann Lange, RN  Safety Interventions:   fall reduction program maintained   lighting adjusted for tasks/safety   low bed   family at bedside  Taken 04/11/2023 1000 by Charlann Lange, RN  Safety Interventions:   fall reduction program maintained   family at bedside   lighting adjusted for tasks/safety   low bed  Taken 04/11/2023 0800 by Marylou Mccoy A, RN  Safety Interventions:   fall reduction program maintained   family at bedside   lighting adjusted for tasks/safety   low bed

## 2023-04-11 NOTE — Unmapped (Signed)
The prior authorization for  Silodosin  was denied.

## 2023-04-11 NOTE — Unmapped (Signed)
Family Medicine Inpatient Service  Progress Note    Team: [facility] (pgr [phone])    Hospital Day: 0    ASSESSMENT / PLAN:   Brandi Flynn is a 22 y.o. female with a past medical history significant for MS, nephrolithiasis s/p right ureteral stent placement (6/29) who presents with nausea, vomiting and right flank pain     # Right-sided nephrolithiasis c/f superimposed pyelonephritis  Patient presenting with progressive N/V, right flank pain, and tachycardia with mild leukocytosis, UA concerning for infection on admission, concerning for pyelonephritis in setting of R nephrolithiasis. CT A/P (6/26) showing a 6 x 4 mm stone and hydronephrosis. Patient underwent right ureteral stent placement on 6/29 however stone passage was unsuccessful; patient plans to get lithotripsy outpatient on 7/26.  She was recently started on cefdinir on 7/12 however patient unable to take antibiotics secondary to nausea/vomiting. Admitted for IV antibiotics and urology consult. Will await urine culture and resolution of nausea and vomiting prior to transitioning to PO antibiotics. Urology consulted in AM, appreciate recs.   - Continue IV ceftriaxone 1 g daily  - S/p 1 L NS, 1 L LR  - F/up urine culture  - Daily CBC and BMP  - Tylenol sch, IM Toradol 50 mg for mod pain. Will d/c IV morphine 2 g q4h for severe pain as patient has not used.   - Zofran as needed for nausea  - Consider repeat CT A/P with contrast if fever or worsening pain  -Urology consulted, appreciate recs  No acute urologic intervention  Pt should remain on culture specific abx through day of surgery (7/26)    # Bilateral lower extremity swelling  Nonpitting edema noted bilaterally. Patient has history of bilateral leg swelling though she says that this has been getting worse recently.  She has been bedbound due to pain for the past several days.  Suspect this is swelling in setting of MS and immobility. Patient has been encouraged to walk today to minimize risk of DVT. No concern for DVT at this time given lack of erythema or pain.     # Urinary incontinence - Multiple sclerosis  - Continue oxybutynin, gabapentin 300  - Patient recently started on teriflunomide for MS though this was stopped in setting of nephrolithiasis     # FEN/GI:  - IVF None  - Check electrolytes as indicated, replete as needed.  - Diet Regular     # PPX:   - DVT: SQ Lovenox    # Checklist:  - IVF None  - Daily labs needed: CBC and BMP  - Diet Regular  - Bowel Regimen: No indication for a bowel regimen at this time  - DVT: SQ Lovenox  - Code Status:   Orders Placed This Encounter   Procedures    Full Code     Standing Status:   Standing     Number of Occurrences:   1     - Dispo: Floor    [ ]  Anticipated Discharge Location: Home  [ ]  PT/OT/DME: No needs anticipated  [ ]  CM/SW needs: None anticipated  [ ]  Follow up appt: Appointment needed, follows with [facility] with Dr. Dow Adolph     SUBJECTIVE:  Interval events: Patient states that she still feels nauseous at times, reminded of prn zofran for management. Patient expresses positional discomfort, but is managing with pain regimen at this time. She has social support present at the bedside.     REVIEW OF SYSTEMS:  Pertinent  positives and negatives per HPI. A complete review of systems otherwise negative.    PHYSICAL EXAM:      Intake/Output Summary (Last 24 hours) at 04/11/2023 0654  Last data filed at 04/11/2023 0117  Gross per 24 hour   Intake 100 ml   Output --   Net 100 ml       Recent Vitals:  Vitals:    04/11/23 0245   BP: 132/87   Pulse: 94   Resp: 21   Temp: 36.6 ??C (97.9 ??F)   SpO2: 97%     GEN: Well-appearing, sitting in bed, NAD   Eyes: No scleral icterus. Conjunctiva non-erythematous. EOMI.  HEENT: NCAT, MMM. Oropharynx clear.  Neck: Supple.  CV: Regular rate and rhythm. No murmurs/rubs/gallops. No costochondral tenderness. No cyanosis or clubbing. Cap Refill < 2 secs  Pulm: CTAB. No wheezing, crackles, or rhonchi.  Abd: Exquisite tenderness to palpation in the right flank and right side of her abdomen.  Voluntary guarding without rebound. Positive CVA tenderness on the right. Suprapubic tenderness appreciated.  Neuro: A&O x 3. No focal deficits. Strength 5/5 UE/LE.   Ext: Mild peripheral edema bilateral.  Palpable distal pulses.  Skin: No rashes or skin lesions.     LABS/ STUDIES:    All imaging, laboratory studies, and other pertinent tests including electrocardiography within the last 24 hours were reviewed and are summarized within the assessment and plan.     NUTRITION:                       Josefa Half, MS4      Katherine Roan, MD,  PGY2  April 11, 2023 6:54 AM

## 2023-04-11 NOTE — Unmapped (Signed)
Family Medicine Inpatient Service  History and Physical Note    Team: [facility] (pgr [phone])  PCP: [name], FNP  Date of Admission: April 11, 2023  Code Status: full code  Emergency Contact: Smith,K[employer]opher [employer] engineer) [phone] (Mobile)     ASSESSMENT / PLAN:   Brandi Flynn is a 22 y.o. female with a past medical history significant for MS, nephrolithiasis s/p right ureteral stent placement (6/29) who presents with nausea, vomiting and right flank pain    # Right-sided nephrolithiasis c/f superimposed pyelonephritis  Patient presenting with progressive N/V and right flank pain. Tachycardic with mild leukocytosis, UA concerning for infection on admission, concerning for pyelonephritis in setting of R nephrolithiasis. CT A/P (6/26) showing a 6 x 4 mm stone and hydronephrosis. Patient underwent right ureteral stent placement on 6/29 however stone passage was unsuccessful; patient plans to get lithotripsy outpatient on 7/26.  She was recently started on cefdinir on 7/12 however patient unable to take antibiotics secondary to nausea/vomiting. Will admit for IV antibiotics and urology consult.   - IV ceftriaxone  - S/p 1 L NS; will give additional 1 L LR  - F/up urine culture  - Daily CBC and BMP  - Tylenol sch, IM Toradol 50 mg for mod pain, IV morphine 2 g q4h for severe pain  - Zofran as needed for nausea  - Consider repeat CT A/P with contrast if fever or worsening pain  - Urology consult    # Bilateral lower extremity swelling  Nonpitting edema noted bilaterally. Patient has history of bilateral leg swelling though she says that this has been getting worse recently.  She has been bedbound due to pain for the past several days.  Suspect this is swelling in setting of MS and immobility.   - Consider PVL if concern for DVT    # Urinary incontinence - Multiple sclerosis  - Continue oxybutynin  - Patient recently started on teriflunomide for MS though this was stopped in setting of nephrolithiasis      # FEN/GI:  - IVF None  - Check electrolytes as indicated, replete as needed.  - Diet Regular    # PPX:   - DVT: SQ Lovenox    # Dispo: Floor  [ ]  Anticipated Discharge Location: Home  [ ]  PT/OT/DME: No needs anticipated  [ ]  CM/SW needs: None anticipated  [ ]  Teaching: None anticipated    HISTORY OF PRESENT ILLNESS:  Brandi Flynn is a 22 y.o. female who presents with right-sided flank pain.  Patient was recently started on cefdinir (7/12) due to concern for UTI by her urologist.  Patient is unable to take cefdinir due to nausea and vomiting.  She was recently seen in the ED on 7/13 for similar complaints of inability to tolerate p.o. and right-sided flank pain.  Patient states that she has had worsening right-sided flank pain since her ureteral stent was placed on 6/29.  She has been unable to ambulate over the past few days due to the pain.  She endorses dysuria, urinary urgency/frequency.  She does have urinary urgency at baseline due to her MS and possible neurogenic bladder however she has been unable to get out of bed and has been soiling the bed due to this.  She says that her urinary frequency has gotten worse over the past week as well as her dysuria.    ED Course:   Patient tachycardic but otherwise hemodynamically stable and afebrile on admission.  She did have leukocytosis with  an elevated absolute neutrophil count.  Her CMP including lites, renal function, liver function were all within normal limits.  Patient was given Toradol, Zofran, and 1 dose of ceftriaxone in the ED.    COVID-19 Testing on Admission: Asymptomatic &  not tested    PAST MEDICAL / SURGICAL HX:  Past Medical History:   Diagnosis Date    Disease of thyroid gland     Multiple sclerosis (CMS-HCC)     Obesity     Vertigo      Past Surgical History:   Procedure Laterality Date    PR CYSTOSCOPY,INSERT URETERAL STENT Right 03/26/2023    Procedure: CYSTOURETHROSCOPY,  WITH INSERTION OF INDWELLING URETERAL STENT (EG, GIBBONS OR DOUBLE-J TYPE);  Surgeon: Annye English, MD;  Location: MAIN OR [facility];  Service: Urology    PR CYSTOURETHROSCOPY,URETER CATHETER Right 03/26/2023    Procedure: CYSTOURETHROSCOPY, W/URETERAL CATHETERIZATION, W/WO IRRIG, INSTILL, OR URETEROPYELOG, EXCLUS OF RADIOLG SVC;  Surgeon: Annye English, MD;  Location: MAIN OR [facility];  Service: Urology       FAMILY HX:   Family History   Problem Relation Age of Onset    Thyroid disease Mother     Thyroid disease Maternal Grandmother     Thyroid disease Maternal Grandfather     Thyroid disease Paternal Grandmother     Vitiligo Paternal Grandmother        SOCIAL HX:   Social History     Socioeconomic History    Marital status: Single   Tobacco Use    Smoking status: Former     Types: e-Cigarettes    Smokeless tobacco: Never    Tobacco comments:     Vape   Vaping Use    Vaping status: Never Used   Substance and Sexual Activity    Alcohol use: Not Currently    Drug use: Never    Sexual activity: Yes   Social History Narrative    ** Merged History Encounter **          Social Determinants of Health     Financial Resource Strain: Low Risk  (01/06/2023)    Overall Financial Resource Strain (CARDIA)     Difficulty of Paying Living Expenses: Not hard at all   Food Insecurity: No Food Insecurity (01/06/2023)    Hunger Vital Sign     Worried About Running Out of Food in the Last Year: Never true     Ran Out of Food in the Last Year: Never true   Transportation Needs: No Transportation Needs (01/14/2023)    PRAPARE - Therapist, art (Medical): No     Lack of Transportation (Non-Medical): No       MEDICATIONS / ALLERGIES:  Medications Prior to Admission   Medication Sig Dispense Refill Last Dose    carboxymethylcellulose sodium (REFRESH CELLUVISC) 1 % DpGe Administer 1 drop to both eyes four (4) times a day. 1 each 0 Past Week    cetirizine (ZYRTEC) 10 MG tablet Take 1 tablet (10 mg total) by mouth daily as needed for allergies. 30 tablet 0 Past Week cholecalciferol, vitamin D3 25 mcg, 1,000 units,, (CHOLECALCIFEROL-25 MCG, 1,000 UNIT,) 1,000 unit (25 mcg) tablet Take 1 tablet (25 mcg total) by mouth daily.   04/10/2023    loperamide (IMODIUM A-D) 2 mg tablet Take 1 tablet (2 mg total) by mouth four (4) times a day as needed for diarrhea. 30 tablet 0 Past Week    nystatin (MYCOSTATIN) 100,000 unit/gram powder  Apply 1 Application topically two (2) times a day. 60 g 1 04/10/2023    [EXPIRED] acetaminophen (TYLENOL EXTRA STRENGTH) 500 MG tablet Take 2 tablets (1,000 mg total) by mouth every eight (8) hours for 7 days. 42 tablet 0     baclofen (LIORESAL) 10 MG tablet Take 1 tablet (10 mg total) by mouth daily. (Patient not taking: Reported on 03/25/2023) 30 tablet 1 Not Taking    [EXPIRED] cefdinir (OMNICEF) 300 MG capsule Take 2 capsules (600 mg total) by mouth daily for 10 days. 20 capsule 0     [EXPIRED] cephalexin (KEFLEX) 500 MG capsule Take 1 capsule (500 mg total) by mouth four (4) times a day for 5 days. 20 capsule 0     [EXPIRED] doxycycline (VIBRAMYCIN) 100 MG capsule Take 1 capsule (100 mg total) by mouth two (2) times a day for 10 days. 20 capsule 0     fluconazole (DIFLUCAN) 150 MG tablet Take 1 tablet (150 mg total) by mouth once a week for 6 doses. (Patient not taking: Reported on 04/07/2023) 6 tablet 0 Not Taking    gabapentin (NEURONTIN) 300 MG capsule Take 1 capsule (300 mg total) by mouth Three (3) times a day. (Patient not taking: Reported on 04/11/2023) 270 capsule 3 Not Taking    [EXPIRED] ketorolac (TORADOL) 10 mg tablet Take 1 tablet (10 mg total) by mouth every six (6) hours as needed for pain for up to 7 days. 28 tablet 0     [EXPIRED] ketorolac (TORADOL) 10 mg tablet Take 1 tablet (10 mg total) by mouth every six (6) hours as needed for pain for up to 7 days. 20 tablet 0     [EXPIRED] ondansetron (ZOFRAN-ODT) 8 MG disintegrating tablet Dissolve 1 tablet (8 mg total) in the mouth every eight (8) hours as needed for nausea. 90 tablet 0 oxybutynin (DITROPAN XL) 10 MG 24 hr tablet Take 1 tablet (10 mg total) by mouth daily. (Patient not taking: Reported on 04/11/2023) 30 tablet 11 Not Taking    [EXPIRED] phenazopyridine (PYRIDIUM) 100 MG tablet Take 2 tablets (200 mg total) by mouth Three (3) times a day as needed for pain for up to 2 days. 12 tablet 0     silodosin 4 mg cap Take 4 mg by mouth in the morning. May make you feel lightheaded.. (Patient not taking: Reported on 04/11/2023) 30 capsule 3 Not Taking    teriflunomide 14 mg Tab Take 1 tablet (14 mg total) by mouth in the morning. (Patient not taking: Reported on 04/11/2023) 30 tablet 5 Not Taking       Allergies   Allergen Reactions    Blueberry Hives    Peanut Swelling     Swelling tongue       IMMUNIZATIONS:  Immunization History   Administered Date(s) Administered    COVID-19 VAC,MRNA,TRIS(12Y UP)(PFIZER)(GRAY CAP) 01/24/2020, 02/14/2020    DTaP 12/06/2002, 02/01/2003, 04/16/2003, 04/06/2004, 10/25/2006    Haemophilis Influenza Type B Vaccine Hboc 12/06/2002, 02/01/2003, 04/16/2003, 11/14/2003    Hepatitis A Vaccine Pediatric / Adolescent 2 Dose IM 11/10/2005, 10/25/2006    Hepatitis B vaccine, pediatric/adolescent dosage, 12-05-02, 11/12/2002, 07/15/2003    Human Pappilomavirus Vaccine,9-Valent(PF) 04/03/2014, 09/04/2014, 04/11/2015    Influenza Vaccine Quad(PF)(Afluria)39mo-Adult 08/30/2011, 08/31/2012, 09/04/2014    MMR 11/14/2003, 10/25/2006    Meningococcal Conjugate MCV4P 04/03/2014    Pneumococcal conjugate -PCV7 12/06/2002, 02/01/2003, 04/16/2003, 04/06/2004    Poliovirus,inactivated (IPV) 12/06/2002, 02/01/2003, 07/15/2003, 10/25/2006    TdaP 04/03/2014    Varicella 11/14/2003, 10/25/2006  REVIEW OF SYSTEMS:  Pertinent positives and negatives per HPI. A complete review of systems otherwise negative.    PHYSICAL EXAM:    Initial ED Vitals:   ED Triage Vitals   Enc Vitals Group      BP 04/10/23 2108 137/93      Heart Rate 04/10/23 2130 84      SpO2 Pulse 04/10/23 2109 113 Resp 04/10/23 2130 20      Temp 04/10/23 2100 36.8 ??C (98.3 ??F)      Temp Source 04/10/23 2100 Oral      SpO2 04/10/23 2109 97 %      Weight 04/11/23 0245 (!) 139.7 kg (308 lb)      Height 04/11/23 0245 1.6 m (5' 3)      Head Circumference --       Peak Flow --       Pain Score --       Pain Loc --       Pain Education --       Exclude from Growth Chart --        Recent Vitals:  Vitals:    04/11/23 0245   BP: 132/87   Pulse: 94   Resp: 21   Temp: 36.6 ??C (97.9 ??F)   SpO2: 97%       GEN: Well-appearing, lying in bed, NAD   Eyes: No scleral icterus. Conjunctiva non-erythematous. EOMI.  HEENT: NCAT, MMM. Oropharynx clear.  Neck: Supple.  CV: Regular rate and rhythm. No murmurs/rubs/gallops. No costochondral tenderness. No cyanosis or clubbing. Cap Refill < 2 secs  Pulm: CTAB. No wheezing, crackles, or rhonchi.  Abd: Exquisite tenderness to palpation in the right flank and right side of her abdomen.  Voluntary guarding without rebound. Positive CVA tenderness on the right.  No suprapubic tenderness appreciated.  Neuro: A&O x 3. No focal deficits. Strength 5/5 UE/LE.   Ext: Mild peripheral edema bilateral.  Palpable distal pulses.  Skin: No rashes or skin lesions.       LABS/ STUDIES:  All imaging, laboratory studies, and other pertinent tests including electrocardiography were reviewed prior to admission and are summarized within the assessment and plan.     Rodena Medin, DO,  PGY2  April 11, 2023 3:14 AM

## 2023-04-11 NOTE — Unmapped (Signed)
A prior authorization for  Silodosin  was submitted via CoverMyMeds to patient's insurance on file: [employer] .

## 2023-04-11 NOTE — Unmapped (Signed)
Per pt having N/V/D and flank pain on right side. Seen on 7/12 for the same.

## 2023-04-11 NOTE — Unmapped (Addendum)
#   Right-sided nephrolithiasis   Patient presented with progressive N/V, right flank pain and tachycardia with recent hospitalization for similar. Found to have mild leukocytosis and a UA concerning for superimposed pyelonephritis in setting of known R nephrolithiasis. CT A/P (6/26) with a 6 x 4 mm stone and hydronephrosis status post a right ureteral stent placement with unsuccessful stone passage. She had been trialed on Tamsulosin, but did not tolerate. She was prescribed sildodosin as an alternative, but this was denied by insurance. Patient endorsed suprapubic pain and flank pain throughout admission. She received IV ceftriaxone for presumed pyelonephritis which was subsequently discontinued when urine culture came back as mixed urogenital flora (along with 2 prior outpatient urine cultures from 7/11 and 7/12). Urology was consulted and recommended no further surgical intervention prior to pre-scheduled outpatient lithotripsy for 7/26. Pain was managed with scheduled tylenol and as needed IV Toradol. Oral Toradol is not available inpatient, and was the only oral NSAID that patient was interested in taking, so she was continued on IV Toradol until discharge, with plan to switch to oral Toradol on discharge along with continued scheduled tylenol 1000 mg TID and gabapentin 300 mg TID PRN. Also recommended taking famotidine for gastric protection given extended use of Toradol. Nausea was managed to Zofran, initially IV and then oral, which she can continue at home. At time of discharge patient was still having some pain and nausea, but able to ambulate independently, urinate without difficulty and tolerating sufficient PO to maintain adequate hydration and nutrition. Will follow-up with urology on 7/26 for lithotripsy.     # Bilateral lower extremity swelling - Left Calf pain  Patient reported b/l leg swelling that has been chronically worsening. No significant edema appreciated on exam. Likely dependent edema from limited activity. Patient was largely bedbound while inpatient due to flank discomfort. Declined Lovenox DVT prophylaxis while inpatient and encouraged to ambulate. On 7/16 she endorsed exquisite tenderness and swelling in the LLE upper calf with some overlying ecchymosis visible on 7/17. D-dimer WNL and PVLs unremarkable for DVT. No erythema, fluctuance, fever or elevated WBC so low concern for infection. Likely muscle pain, possibly traumatic, though patient dose not recall hitting her leg. Advised to follow-up with PCP for further monitoring.     # Urinary incontinence - Multiple sclerosis  Patient has a baseline of urinary incontinence on oxybutinin 10mg  ER, replaced with formulary oxybutynin 5mg  IR BID while inpatient. Patient recently started on teriflunomide for MS though this was stopped in setting of nephrolithiasis.    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 Says she vapes several times per day. Initially declined nicotine replacement therapy due to feeling it was ineffective in the past, but after talking with the Tobacco Cessation team agreed to try with stronger (21 mg) nicotine patch than she had received in the past. NRT was ordered for patient on discharge.

## 2023-04-11 NOTE — Unmapped (Signed)
[facility]  Emergency Department Provider Note     ED Clinical Impression     Final diagnoses:   None        History     Chief Complaint  Chief Complaint   Patient presents with    Flank Pain       HPI   Brandi Flynn is a 22 y.o. female with a past medical history notable for MS with recent right ureteral stent placement secondary to ureterolithiasis presenting secondary to nausea and vomiting with right-sided flank pain.  Seen on 7/12 for similar symptoms had a KUB with redemonstrated proper stent placement and a right upper quadrant ultrasound without evidence of cholelithiasis or cholecystitis though examination was limited to body habitus.  Upon being discharged home patient was able to fall asleep without difficulty, however woke up later that morning vomiting, with the inability to tolerate p.o. intake.  Has not taken her cefdinir over the preceding 2 days due to this.  No fevers at home.  Continues to have right-sided abdominal/flank pain, similar to previous.    Outside Historian(s): None.    Records Reviewed: ED note from 04/08/2023 -patient seen for multiple episodes of nonbloody nonbilious episodes which resolved during hospital stay, negative KUB with appropriate stent placement and right upper quadrant ultrasound Pathology.  Able to tolerate p.o. intake and discharge accordingly.     Impression, Medical Decision Making, ED Course     Impression and MDM: 22 y.o. female with a past medical history as above presenting secondary to inability tolerate p.o. intake in setting of known pyelonephritis.  Generally well-appearing on presentation.  Unremarkable vitals.  Similar examination when seen previously 2 days prior with right-sided abdominal tenderness and CVA tenderness.  Examination otherwise benign.  Workup consistent with pyelonephritis as mild leukocytosis of 13.1 with obviously infected urine, with nausea and vomiting.  Patient covered with ceftriaxone, given 1 L IV fluids, and Zofran for nausea.  Has tolerated small sips of orange juice while in the emergency department, believe she will benefit from short hospitalization due to failure of outpatient management and inability to take her antibiotics appropriately.    ED Course as of 04/11/23 0242   Mon Apr 11, 2023   0046 MAO paged as patient requires admission for IV antibiotics as she has not been able to tolerate p.o. intake at home and has known right-sided pyelonephritis which is been adequately treated she has not been taking her antibiotics because of vomiting.   7 Spoke with family medicine, they will admit the patient.       Independent Interpretation of Studies: I have independently interpreted the following studies:  None    Discussion of Management With Other Providers or Support Staff: I discussed the management of this patient with the:  Family medicine    Considerations Regarding Disposition/Escalation of Care and Critical Care:  Requires admission due to failure of outpatient therapy and inability to tolerate p.o. intake.  ____________________________________________    The case was discussed with the attending physician, who is in agreement with the above assessment and plan.     Orders Placed This Encounter   Procedures    Urine Culture    CBC w/ Differential    Comprehensive Metabolic Panel    Urinalysis with Microscopy with Culture Reflex    hCG, Quantitative, Pregnancy    Basic metabolic panel    CBC    Nutrition Therapy Regular/House    Vital signs    Notify Provider  Notify Provider    Patient may shower    Measure height    Weigh patient    Flush line per protocol    Full Code    Insert peripheral IV    Saline lock IV    Place Patient in Bed          Physical Exam     VITAL SIGNS:      Vitals:    04/10/23 2109 04/10/23 2130 04/11/23 0102 04/11/23 0106   BP:   93/68    Pulse:  84     Resp:  20     Temp:    36.5 ??C (97.7 ??F)   TempSrc:    Oral   SpO2: 97%  97%        Constitutional: Alert and oriented. No acute distress.  Eyes: Conjunctivae are normal.  HEENT: Normocephalic and atraumatic. Conjunctivae clear.   Cardiovascular: Rate as above, regular rhythm. Normal and symmetric distal pulses. No lower extremity edema  Respiratory: Normal respiratory effort. Clear breath sounds bilaterally. No wheezes, rhonchi, rales.   Gastrointestinal: Soft, non-distended, mild right-sided abdominal tenderness with associated CVA tenderness.  No rebound or peritonitic signs.  Genitourinary: Deferred.  Musculoskeletal: Non-tender with normal range of motion in all extremities.  Neurologic: Normal speech and language. No facial asymmetry. No gross focal neurologic deficits are appreciated. Patient moves all extremities equally.  Skin: Skin warm, dry and intact. No rash or erythema noted.  Psychiatric: Mood, affect, and speech grossly normal.        Other History     Past Medical History:   Diagnosis Date    Disease of thyroid gland     Multiple sclerosis (CMS-HCC)     Obesity     Vertigo        Past Surgical History:   Procedure Laterality Date    PR CYSTOSCOPY,INSERT URETERAL STENT Right 03/26/2023    Procedure: CYSTOURETHROSCOPY,  WITH INSERTION OF INDWELLING URETERAL STENT (EG, GIBBONS OR DOUBLE-J TYPE);  Surgeon: Annye English, MD;  Location: MAIN OR [facility];  Service: Urology    PR CYSTOURETHROSCOPY,URETER CATHETER Right 03/26/2023    Procedure: CYSTOURETHROSCOPY, W/URETERAL CATHETERIZATION, W/WO IRRIG, INSTILL, OR URETEROPYELOG, EXCLUS OF RADIOLG SVC;  Surgeon: Annye English, MD;  Location: MAIN OR [facility];  Service: Urology         Current Facility-Administered Medications:     acetaminophen (TYLENOL) tablet 650 mg, 650 mg, Oral, Q4H PRN, Repko, Alexander J, DO    enoxaparin (LOVENOX) syringe 40 mg, 40 mg, Subcutaneous, Q12H, Repko, Alexander J, DO    Allergies  Blueberry and Peanut    Family History  Family History   Problem Relation Age of Onset    Thyroid disease Mother     Thyroid disease Maternal Grandmother     Thyroid disease Maternal Grandfather     Thyroid disease Paternal Grandmother     Vitiligo Paternal Grandmother        Social History  Social History     Tobacco Use    Smoking status: Former     Types: e-Cigarettes    Smokeless tobacco: Never    Tobacco comments:     Vape   Vaping Use    Vaping status: Never Used   Substance Use Topics    Alcohol use: Not Currently    Drug use: Never        Radiology     No orders to display       Pertinent labs & imaging  results that were available during my care of the patient were independently interpreted by me and considered in my medical decision making (see chart for details).    Portions of this record have been created using Scientist, clinical (histocompatibility and immunogenetics). Dictation errors have been sought, but may not have been identified and corrected.         Sanjana Folz, Darlina Rumpf, MD  Resident  04/11/23 [phone]

## 2023-04-11 NOTE — Unmapped (Incomplete)
***   to primary team    Briefly, pt is 64 YOF with ureteral stone s/p right ureteral stent placement on 03/26/2023.   Pt with definitive USE scheduled on 04/22/2023.     Pt currently admitted to hospital with c/f pyelonephritis given flank pain, N/V. Ultimately required admission due to inability to tolerate trial of PO antibiotics as outpt.     Pt is currently HDS. Upon admission, had mild leukocytosis, now downtrending.   No concern for AKI.   UA dirty - reflex culture pending. Currently receiving IV CTX.     KUB with stent in what appears to be appropriate position - POCUS showing no concern for hydronephrosis or stent malposition given lack of hydronephrosis.     No current urologic intervention indicated.  Pt will need to remain on culture-specific abx through time of surgery.   Reasonable to obtain formal RUS prior to discharge to better evaluate stent in kidney.       Roby Lofts, MD  PGY3 Urologic Surgery  [facility]

## 2023-04-12 DIAGNOSIS — N201 Calculus of ureter: Principal | ICD-10-CM

## 2023-04-12 LAB — BASIC METABOLIC PANEL
ANION GAP: 6 mmol/L (ref 5–14)
BLOOD UREA NITROGEN: 9 mg/dL (ref 9–23)
BUN / CREAT RATIO: 14
CALCIUM: 9.3 mg/dL (ref 8.7–10.4)
CHLORIDE: 109 mmol/L — ABNORMAL HIGH (ref 98–107)
CO2: 28.9 mmol/L (ref 20.0–31.0)
CREATININE: 0.63 mg/dL
EGFR CKD-EPI (2021) FEMALE: >90 mL/min/{1.73_m2} (ref >=60)
GLUCOSE RANDOM: 103 mg/dL (ref 70–179)
POTASSIUM: 4.4 mmol/L (ref 3.4–4.8)
SODIUM: 144 mmol/L (ref 135–145)

## 2023-04-12 LAB — CBC
HEMATOCRIT: 40 % (ref 34.0–44.0)
HEMOGLOBIN: 13.3 g/dL (ref 11.3–14.9)
MEAN CORPUSCULAR HEMOGLOBIN CONC: 33.3 g/dL (ref 32.3–35.0)
MEAN CORPUSCULAR HEMOGLOBIN: 29.5 pg (ref 25.9–32.4)
MEAN CORPUSCULAR VOLUME: 88.6 fL (ref 77.6–95.7)
MEAN PLATELET VOLUME: 9 fL (ref 7.3–10.7)
PLATELET COUNT: 215 10*9/L (ref 170–380)
RED BLOOD CELL COUNT: 4.51 10*12/L (ref 3.95–5.13)
RED CELL DISTRIBUTION WIDTH: 13.4 % (ref 12.2–15.2)
WBC ADJUSTED: 9 10*9/L (ref 4.2–10.2)

## 2023-04-12 MED ADMIN — gabapentin (NEURONTIN) capsule 300 mg: 300 mg | ORAL | NDC 76420004790

## 2023-04-12 MED ADMIN — cholecalciferol (vitamin D3 25 mcg (1,000 units)) tablet 25 mcg: 25 ug | ORAL | NDC 53191040901

## 2023-04-12 MED ADMIN — ondansetron (ZOFRAN) injection 4 mg: 4 mg | INTRAVENOUS | @ 17:00:00 | NDC 71930001752

## 2023-04-12 MED ADMIN — gabapentin (NEURONTIN) capsule 300 mg: 300 mg | ORAL | @ 12:00:00 | NDC 76420004790

## 2023-04-12 MED ADMIN — cefTRIAXone (ROCEPHIN) 1 g in sodium chloride 0.9 % (NS) 100 mL IVPB-MBP: 1 g | INTRAVENOUS | @ 02:00:00 | Stop: 2023-04-17 | NDC 68258901501

## 2023-04-12 MED ADMIN — acetaminophen (TYLENOL) tablet 650 mg: 650 mg | ORAL | @ 12:00:00 | NDC 50580048790

## 2023-04-12 MED ADMIN — ketorolac (TORADOL) injection 15 mg: 15 mg | INTRAVENOUS | @ 12:00:00 | Stop: 2023-04-12 | NDC 00409228831

## 2023-04-12 MED ADMIN — ketorolac (TORADOL) injection 15 mg: 15 mg | INTRAVENOUS | Stop: 2023-04-16 | NDC 00409228831

## 2023-04-12 MED ADMIN — gabapentin (NEURONTIN) capsule 300 mg: 300 mg | ORAL | @ 17:00:00 | NDC 76420004790

## 2023-04-12 MED ADMIN — lactated ringers bolus 500 mL: 500 mL | INTRAVENOUS | @ 03:00:00 | Stop: 2023-04-11 | NDC 73913127180

## 2023-04-12 MED ADMIN — acetaminophen (TYLENOL) tablet 650 mg: 650 mg | ORAL | NDC 50580048790

## 2023-04-12 MED ADMIN — oxybutynin (DITROPAN) tablet 5 mg: 5 mg | ORAL | NDC 64376040216

## 2023-04-12 MED ADMIN — oxybutynin (DITROPAN) tablet 5 mg: 5 mg | ORAL | @ 12:00:00 | NDC 64376040216

## 2023-04-12 MED ADMIN — acetaminophen (TYLENOL) tablet 650 mg: 650 mg | ORAL | @ 17:00:00 | NDC 50580048790

## 2023-04-12 NOTE — Unmapped (Signed)
Physician Discharge Summary HBR  4 BT1 HBR  [address]  [place] [address]  Dept: [phone]  Loc: [phone]     Identifying Information:   Brandi Flynn  [birth year]  [id number]    Primary Care Physician: [name], FNP   Code Status: Full Code    Admit Date: 04/10/2023    Discharge Date: 04/13/2023     Discharge To: Home    Discharge Service: [facility] - FAM [place] [facility]     Discharge Attending Physician: Ledell Peoples, MD    Discharge Diagnoses:  Principal Problem:    Kidney stones  Active Problems:    Tobacco use disorder    Multiple sclerosis (CMS-HCC)    Nausea & vomiting  Resolved Problems:    * No resolved hospital problems. *      Outpatient Provider Follow Up Issues:   [ ]  Urology appointment for lithotripsy 7/26  [ ]  Continued vaping cessation counseling  [ ]  Monitor area of pain in L calf, negative work-up for DVT inpatient    Hospital Course:   # Right-sided nephrolithiasis   Patient presented with progressive N/V, right flank pain and tachycardia with recent hospitalization for similar. Found to have mild leukocytosis and a UA concerning for superimposed pyelonephritis in setting of known R nephrolithiasis. CT A/P (6/26) with a 6 x 4 mm stone and hydronephrosis status post a right ureteral stent placement with unsuccessful stone passage. She had been trialed on Tamsulosin, but did not tolerate. She was prescribed sildodosin as an alternative, but this was denied by insurance. Patient endorsed suprapubic pain and flank pain throughout admission. She received IV ceftriaxone for presumed pyelonephritis which was subsequently discontinued when urine culture came back as mixed urogenital flora (along with 2 prior outpatient urine cultures from 7/11 and 7/12). Urology was consulted and recommended no further surgical intervention prior to pre-scheduled outpatient lithotripsy for 7/26. Pain was managed with scheduled tylenol and as needed IV Toradol. Oral Toradol is not available inpatient, and was the only oral NSAID that patient was interested in taking, so she was continued on IV Toradol until discharge, with plan to switch to oral Toradol on discharge along with continued scheduled tylenol 1000 mg TID and gabapentin 300 mg TID PRN. Also recommended taking famotidine for gastric protection given extended use of Toradol. Nausea was managed to Zofran, initially IV and then oral, which she can continue at home. At time of discharge patient was still having some pain and nausea, but able to ambulate independently, urinate without difficulty and tolerating sufficient PO to maintain adequate hydration and nutrition. Will follow-up with urology on 7/26 for lithotripsy.     # Bilateral lower extremity swelling - Left Calf pain  Patient reported b/l leg swelling that has been chronically worsening. No significant edema appreciated on exam. Likely dependent edema from limited activity. Patient was largely bedbound while inpatient due to flank discomfort. Declined Lovenox DVT prophylaxis while inpatient and encouraged to ambulate. On 7/16 she endorsed exquisite tenderness and swelling in the LLE upper calf with some overlying ecchymosis visible on 7/17. D-dimer WNL and PVLs unremarkable for DVT. No erythema, fluctuance, fever or elevated WBC so low concern for infection. Likely muscle pain, possibly traumatic, though patient dose not recall hitting her leg. Advised to follow-up with PCP for further monitoring.     # Urinary incontinence - Multiple sclerosis  Patient has a baseline of urinary incontinence on oxybutinin 10mg  ER, replaced with formulary oxybutynin 5mg  IR BID while inpatient. Patient  recently started on teriflunomide for MS though this was stopped in setting of nephrolithiasis.    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 Says she vapes several times per day. Initially declined nicotine replacement therapy due to feeling it was ineffective in the past, but after talking with the Tobacco Cessation team agreed to try with stronger (21 mg) nicotine patch than she had received in the past. NRT was ordered for patient on discharge.     Touchbase with Outpatient Provider:  Warm Handoff: Completed on 04/13/23 by Josefa Half (Student) via Epic Secure Chat    Procedures:  None  No admission procedures for hospital encounter.  ______________________________________________________________________  Discharge Medications:     Your Medication List        STOP taking these medications      baclofen 10 MG tablet  Commonly known as: LIORESAL     cefdinir 300 MG capsule  Commonly known as: OMNICEF     cephalexin 500 MG capsule  Commonly known as: KEFLEX     doxycycline 100 MG capsule  Commonly known as: VIBRAMYCIN     fluconazole 150 MG tablet  Commonly known as: DIFLUCAN     phenazopyridine 100 MG tablet  Commonly known as: PYRIDIUM     silodosin 4 mg Cap     teriflunomide 14 mg Tab            START taking these medications      famotidine 20 MG tablet  Commonly known as: PEPCID  Take 1 tablet (20 mg total) by mouth two (2) times a day.     nicotine 21 mg/24 hr patch  Commonly known as: NICODERM CQ  Place 1 patch on the skin daily.  Start taking on: April 14, 2023     nicotine polacrilex 4 MG gum  Commonly known as: NICORETTE  Apply 1 each (4 mg total) to cheek every hour as needed for smoking cessation.     oxybutynin 10 MG 24 hr tablet  Commonly known as: DITROPAN XL  Take 1 tablet (10 mg total) by mouth daily.            CHANGE how you take these medications      ketorolac 10 mg tablet  Commonly known as: TORADOL  Take 1 tablet (10 mg total) by mouth every six (6) hours as needed for pain for up to 9 days.  What changed: Another medication with the same name was removed. Continue taking this medication, and follow the directions you see here.            CONTINUE taking these medications      acetaminophen 500 MG tablet  Commonly known as: TYLENOL EXTRA STRENGTH  Take 2 tablets (1,000 mg total) by mouth every eight (8) hours for 9 days.     cetirizine 10 MG tablet  Commonly known as: ZYRTEC  Take 1 tablet (10 mg total) by mouth daily as needed for allergies.     cholecalciferol-25 mcg (1,000 unit) 1,000 unit (25 mcg) tablet  Generic drug: cholecalciferol (vitamin D3 25 mcg (1,000 units))  Take 1 tablet (25 mcg total) by mouth daily.     gabapentin 300 MG capsule  Commonly known as: NEURONTIN  Take 1 capsule (300 mg total) by mouth Three (3) times a day.     loperamide 2 mg tablet  Commonly known as: IMODIUM A-D  Take 1 tablet (2 mg total) by mouth four (4) times a day as  needed for diarrhea.     nystatin 100,000 unit/gram powder  Commonly known as: MYCOSTATIN  Apply 1 Application topically two (2) times a day.     ondansetron 8 MG disintegrating tablet  Commonly known as: ZOFRAN-ODT  Dissolve 1 tablet (8 mg total) in the mouth every eight (8) hours as needed for nausea.     REFRESH CELLUVISC 1 % Dpge  Generic drug: carboxymethylcellulose sodium  Administer 1 drop to both eyes four (4) times a day.              Allergies:  Blueberry and Peanut  ______________________________________________________________________  Pending Test Results (if blank, then none):          Most Recent Labs:  All lab results last 24 hours -   Recent Results (from the past 24 hour(s))   CBC    Collection Time: 04/13/23  6:22 AM   Result Value Ref Range    WBC 8.2 4.2 - 10.2 10*9/L    RBC 4.52 3.95 - 5.13 10*12/L    HGB 13.5 11.3 - 14.9 g/dL    HCT 32.4 40.1 - 02.7 %    MCV 88.5 77.6 - 95.7 fL    MCH 29.9 25.9 - 32.4 pg    MCHC 33.7 32.3 - 35.0 g/dL    RDW 25.3 66.4 - 40.3 %    MPV 8.9 7.3 - 10.7 fL    Platelet 209 170 - 380 10*9/L   D-Dimer, Quantitative    Collection Time: 04/13/23  6:33 AM   Result Value Ref Range    D-Dimer 462 <=500 ng/mL FEU         Relevant Studies/Radiology (if blank, then none):  PVL Venous Duplex Lower Extremity Left    Result Date: 04/13/2023   Peripheral Vascular Lab     [address]   [place], [place] [address]  PVL VENOUS DUPLEX LOWER EXTREMITY LEFT Patient Demographics Pt. Name: CARVER NAPOLEONE Location: [facility] Inpatient MRN:      [id number]            Sex:      F DOB:      11/25/2000                Age:      22 years  Study Information Authorizing         [id number] [facility]         Performed Time       04/13/2023 7:00:44 Provider Name       [facility]                                  AM Ordering Physician  Katherine Roan      Patient Location     [facility] Accession Number    [id number] UN         Technologist         Marissa                                                                Pachlhofer RVT Diagnosis:  Assisting                                           Technologist Ordered Reason For Exam: r/o DVT Indication: left medial knee/proximal calf pain and discoloration Risk Factors: Multiple recent hospitalizations; decreased mobility and (refusal of lovenox and obesity). Protocol The major deep veins from the inguinal ligament to the ankle are assessed for compressibility and color and spectral Doppler flow characteristics on the requested limb. The assessed veins include common femoral vein, femoral vein in the thigh, popliteal vein, and intramuscular calf veins. The iliac vein is assessed indirectly using Doppler waveform analysis. The great saphenous vein is assessed for compressibility at the saphenofemoral junction, and the small saphenous vein assessed for compressibility behind the knee. A contralateral PW Doppler waveform is obtained for comparison. Limitations: Body habitus and poor ultrasound/tissue interface.  Final Interpretation Right There is no evidence of obstruction proximal to the inguinal ligament or in the common femoral vein. Left There is no evidence of DVT in the lower extremity. There is no evidence of obstruction proximal to the inguinal ligament or in the common femoral vein.  Electronically signed by [id number] Jodell Cipro MD on 04/13/2023 at 8:52:52 AM.  -------------------------------------------------------------------------------- Right Duplex Findings CFV Doppler waveform appears appropriately phasic.  Left Duplex Findings All veins visualized appear fully compressible. Doppler flow signals demonstrate normal spontaneity, phasicity, and augmentation. Right Technical Summary No evidence of iliofemoral obstruction. Left Technical Summary No evidence of deep venous obstruction in the lower extremity. No indirect evidence of obstruction proximal to the inguinal ligament. Final   ______________________________________________________________________  Discharge Instructions:   Activity Instructions       Activity as tolerated                  Other Instructions       Discharge instructions      You were admitted to [facility] for pain management and exploration of possible infection in your kidney after finding a kidney stone on your CT scan in June. Scans of your bladder suggest that the stent that was recently put in is still in the right place. The analysis of your urine initially suggested that you might have had an infection of your urinary tract, so we started you on IV antibiotics while in the hospital. We saw your inflammatory cells improve and got your urine back without any bacteria in it, which reassures [place] that you did not have an infection and don't need antibiotic treatment at this time. You have an appointment on 7/26 with urology to remove your stone. In the meantime, please continue taking tylenol 1000 mg three times a day, toradol 10 mg four times a day as needed, and gabapentin 300 mg three times a day as needed for your pain, and zofran 8mg  three times a day as needed for your nausea. You should also take Pepcid (famotidine) 20 mg twice a day to help protect your stomach while you are taking the toradol.    You had pain and discomfort with touch to your left calf during your admission. Imaging shows that you do not have a blood clot.    Please carefully read and follow these instructions below upon your discharge:    1) Please take your medications as prescribed and note the changes listed on your discharge. At future follow-up appointments, please be  sure to take all of your medications with you so your provider can better guide your care.     2) Seek medical care with your primary care doctor or local Emergency Room or Urgent Care if you develop any changes in your mental status, worsening abdominal pain, fevers greater than 101.5, any unexplained/unrelieved shortness of breath, uncontrolled nausea and vomiting that keeps you from remaining hydrated or taking your medication, or any other concerning symptoms.     3) Please go to your follow-up appointments. Some of your follow-up appointments have been listed below. If you do not see an appointment listed below with your primary care doctor, please call your doctor's office as soon as possible to schedule an appointment to be seen within 7-10 days of discharge.     4) If you have any concerns before you are able to follow-up with your primary care doctor, you can reach [place] by calling [phone] and asking to page the [facility] team resident on call.                 Follow Up instructions and Outpatient Referrals     Discharge instructions          Appointments which have been scheduled for you      Apr 22, 2023  CYSTOURETHROSCOPY, WITH URETEROSCOPY AND/OR PYELOSCOPY; WITH LITHOTRIPSY INCLUDING INSERTION OF INDWELLING URETERAL STENT with Oleh Genin, MD  CYSTO PERIOP [facility] REGION) [address]  [place] [address]  [phone]     Apr 25, 2023 3:40 PM  (Arrive by 3:25 PM)  RETURN CARE TRANSITIONS [place] with [name], FNP  [facility] FAMILY MEDICINE [address] [place] [address] [facility] [place]) [address] [place] [address]  [address]  [place] [address]  [phone]        Apr 27, 2023 1:30 PM  (Arrive by 1:00 PM)  FL LUMBAR PUNCTURE - RADIOLOGY with Onnie Boer RM 7  [facility]) [address]  [place] [address]  [phone]   Nadine  Aggrenox/aspirin and extended release dipyridamole - do not stop  Coumadin/warfarin - stop 5 days prior & get order for INR am of procedure  Lovenox/enoxaparin - stop 12 hours prior  Plavix/clopidogrel - stop 5 days prior  Brilinta/ticagrelor - stop 5 days prior  Pletal/cilostazol  - stop 5 days prior  Pradaxa/dabigatran - stop 1-2 days prior (normal renal function vs moderate renal impairment)  Xarelto/ rivaroxaban - stop 1 day before  Eliquis/apixavan - stop 2 days before  Will need someone to drive them home.  Inform of 1 hr. post recovery.  If patient had recent CT/MRI of the head send report with the order. If this is for a methotrexate LP, must get order for methotrexate.         May 10, 2023 10:00 AM  (Arrive by 9:45 AM)  Physical with [name], FNP  [facility] FAMILY MEDICINE [address] [place] [address] [facility] REGION) [address] [place] [address]  [address]  [place] [address]  [phone]   Arrive 15 minutes prior to appointment.         May 19, 2023 1:20 PM  (Arrive by 1:05 PM)  RETURN CONTINUITY with Lucille Passy, MD  [facility] FAMILY MEDICINE [address] [place] [address] [facility] REGION) [address] [place] [address]  [address]  [place] [address]  [phone]        Jun 27, 2023  2:00 PM  (Arrive by 1:45 PM)  RETURN NEUROIMMUNOLOGY with Clara Doreatha Massed, PA  [facility] NEUROLOGY CLINIC [facility]) [address]  [address]  [place] [address]  [phone]        Jul 27, 2023 9:15 AM  NEW  NEURO OPTHAMOLOGY with Tressie Stalker, MD  [facility] OPHTHALMOLOGY [address] [facility] REGION) [address]  [address]  [place] [address]  [phone] ______________________________________________________________________  Discharge Day Services:  BP 132/71  - Pulse 86  - Temp 36.9 ??C (98.4 ??F) (Temporal)  - Resp 20  - Ht 160 cm (5' 3)  - Wt (!) 139.7 kg (308 lb)  - SpO2 99%  - BMI 54.56 kg/m??   Pt seen on the day of discharge and determined appropriate for discharge.    GEN: Well-appearing, lying in bed, NAD   Eyes: No scleral icterus. Conjunctiva non-erythematous. EOMI.  HEENT: NCAT, MMM. Oropharynx clear.  Neck: Supple.  CV: Regular rate and rhythm. No murmurs/rubs/gallops. No costochondral tenderness. No cyanosis or clubbing. Cap Refill < 2 secs  Pulm: CTAB. No wheezing, crackles, or rhonchi.  Abd: Tenderness to palpation in the right flank and right side of her abdomen.  Voluntary guarding without rebound. Positive CVA tenderness on the right.  Suprapubic tenderness appreciated.  Neuro: A&O x 3. No focal deficits. Strength 5/5 UE/LE.    Ext: Mild peripheral edema bilateral.  Palpable distal pulses. Firm area of upper LLE calf that is tender to palpation, mild ecchymosis. No erythema appreciated.   Skin: No rashes or skin lesions.     Condition at Discharge: fair    Length of Discharge: I spent greater than 30 mins in the discharge of this patient.     Josefa Half, MS4     I attest that I have reviewed the student note and that the components of the history of the present illness, the physical exam, and the assessment and plan documented were performed by me or were performed in my presence by the student where I verified the documentation and performed (or re-performed) the exam and medical decision making.     Dala Dock, MD  Family Medicine, PGY 2

## 2023-04-12 NOTE — Unmapped (Signed)
Urology Preoperative Phone Note:    Spoke to Brandi Flynn about upcoming surgery.    Procedure Details:  Brandi Flynn is scheduled to undergo surgery with Dr. Gardiner Rhyme on 04/22/2023.   The location of the procedure was confirmed with the patient.    Medication reminders:  Patient was asked to remember to follow any specific instructions to stop medications as instructed by their doctor.    Cardiac Clearance:  Not required    Anticoagulation Plan:  Patient not on anticoagulation.     Urine Culture and Labs:  Urine culture/labs obtained at PCP office and I confirmed it is in the chart    Resident to confirm appropriate peri-operative antibiotics coverage.     General Instructions:  - Reminded not to eat after midnight before coming in for surgery.   - She was instructed to send a message to their provider or call [phone] with any further issues between now and surgery.  - Reminded that they would receive a phone call from [facility] the day prior to surgery regarding their time of arrival  - Reminded they need a ride home from procedure    Outstanding tasks:  Other Patient is in the hospital    Patients Undergoing Prostate Biopsy (CPT 55700):  N/A

## 2023-04-12 NOTE — Unmapped (Signed)
RN spoke with Nestor Ramp., Pharmacy Resolution Specialist. Informed patient will need to try at least one more medication from the preferred medication list. Alfuzosin and Doxazosin, extended release is available on the medication list.

## 2023-04-12 NOTE — Unmapped (Signed)
Pt vitals remain stable and pt tolerating meds well. Pt stated she felt dehydrated and requested more fluids. Doctor ordered a fluid bolus. PRN toradel given for pain. Boyfriend stayed the night. Bed locked and low and call bell within reach.    Vape confiscated from pt. See Nursing Note.    Problem: Adult Inpatient Plan of Care  Goal: Plan of Care Review  Outcome: Progressing  Goal: Patient-Specific Goal (Individualized)  Outcome: Progressing  Goal: Absence of Hospital-Acquired Illness or Injury  Outcome: Progressing  Intervention: Identify and Manage Fall Risk  Recent Flowsheet Documentation  Taken 04/11/2023 2200 by Sherrie Mustache, RN  Safety Interventions:   low bed   fall reduction program maintained   nonskid shoes/slippers when out of bed  Taken 04/11/2023 2000 by Sherrie Mustache, RN  Safety Interventions:   low bed   fall reduction program maintained   nonskid shoes/slippers when out of bed  Intervention: Prevent Skin Injury  Recent Flowsheet Documentation  Taken 04/11/2023 2200 by Sherrie Mustache, RN  Positioning for Skin: Supine/Back  Skin Protection: adhesive use limited  Taken 04/11/2023 2000 by Sherrie Mustache, RN  Positioning for Skin: Supine/Back  Skin Protection: adhesive use limited  Goal: Optimal Comfort and Wellbeing  Outcome: Progressing  Goal: Readiness for Transition of Care  Outcome: Progressing  Goal: Rounds/Family Conference  Outcome: Progressing     Problem: Self-Care Deficit  Goal: Improved Ability to Complete Activities of Daily Living  Outcome: Progressing     Problem: Fall Injury Risk  Goal: Absence of Fall and Fall-Related Injury  Outcome: Progressing  Intervention: Promote Injury-Free Environment  Recent Flowsheet Documentation  Taken 04/11/2023 2200 by Sherrie Mustache, RN  Safety Interventions:   low bed   fall reduction program maintained   nonskid shoes/slippers when out of bed  Taken 04/11/2023 2000 by Sherrie Mustache, RN  Safety Interventions:   low bed   fall reduction program maintained   nonskid shoes/slippers when out of bed

## 2023-04-12 NOTE — Unmapped (Signed)
Family Medicine Inpatient Service  Progress Note    Team: Family Medicine [facility] (pgr [phone])    Hospital Day: 1    ASSESSMENT / PLAN:   Brandi Flynn is a 22 y.o. female with a past medical history significant for MS, nephrolithiasis s/p right ureteral stent placement (6/29) who presented with nausea, vomiting and right flank pain.      # Right-sided nephrolithiasis c/f superimposed pyelonephritis  Patient presented with progressive N/V, right flank pain and tachycardia with recent hospitalization for similar. Found to have mild leukocytosis and a UA concerning for pyelonephritis in setting of R nephrolithiasis of 6 x 4 mm stone and hydronephrosis. Plans to get lithotripsy outpatient on 7/26. She was recently started on cefdinir on 7/12 however patient unable to take antibiotics secondary to nausea/vomiting, given 1g ceftriaxone. Ucx with normal genital flora so with discontinue abx. Urology on-board and paged regarding previous recommendation for antibiotics until procedure. Pain control with scheduled Tylenol and IM toradol, pt did not desire a move to PO ibuprofen, so will eventually plan to transition to home oral Toradol 10mg .   -STOP Ctx  - Daily CBC  - Tylenol sch, Toradol 15 mg   - Zofran as needed for nausea  - Consider repeat CT A/P with contrast if fever or worsening pain  - Urology following, appreciate recs     # Bilateral lower extremity swelling  Nonpitting edema noted bilaterally. Patient has history of bilateral leg swelling though she says that this has been getting worse recently.  She has been bedbound due to pain for the past several days. R/o for DVT today given immobility while inpatient and patient refusal of Lovenox. Will get D-dimer as initial workup. PVLs can not be completed until tomorrow if concern still present.   - F/up D-dimer      # Urinary incontinence - Multiple sclerosis  She is on home oxybutynin, confirm home dosage at discharge.   - Continue home oxybutynin (10 XR replaced with formulary 5 IR BID)  - Patient recently started on teriflunomide for MS though this was stopped in setting of nephrolithiasis    #Vaping   Vaping since high school. Low concentration vape. 3-4X day--1 pack of pods per month.Vape confiscated by nursing overnight.  -Declines NRT  -Tobacco cessation consult placed    # FEN/GI:  - IVF None  - Check electrolytes as indicated, replete as needed.  - Diet Regular     # PPX:   - DVT: Patient declined Lovenox    # Checklist:  - IVF None  - Daily labs needed:  None  - Diet Regular  - Bowel Regimen: No indication for a bowel regimen at this time  - DVT: SQ Lovenox, patient refusing  - Code Status:   Orders Placed This Encounter   Procedures    Full Code     Standing Status:   Standing     Number of Occurrences:   1     - Dispo: Floor    [ ]  Anticipated Discharge Location: Home  [ ]  PT/OT/DME: None indicated  [ ]  CM/SW needs: None anticipated  [ ]  Follow up appt: Appointment with urology for procedure 7/26    SUBJECTIVE:  Interval events: Liley says that she is feeling like there is better pain control this morning. Still endorses suprapubic and flank tenderness. Able to sit up in bed. Reports worsening tenderness in the upper left calf that has become very tender to touch. States that she feels a  throbbing pain in the region and has been unable to get it to stop. She has not been ambulating around the room much due to flank discomfort. Continues to have nausea and vomit, though she has not required the prn Zofran. She was encouraged to use bed pan or other container so that the nurse could evaluation. Patient states that she does not like blood thinners and has not been taking offered Lovenox while inpatient.     REVIEW OF SYSTEMS:  Pertinent positives and negatives per HPI. A complete review of systems otherwise negative.    PHYSICAL EXAM:    No intake or output data in the 24 hours ending 04/12/23 1419    Recent Vitals:  Vitals:    04/12/23 0730   BP: 119/75 Pulse: 75   Resp: 18   Temp: 36.6 ??C (97.8 ??F)   SpO2: 99%     GEN: Well-appearing, lying in bed, NAD   Eyes: No scleral icterus. Conjunctiva non-erythematous. EOMI.  HEENT: NCAT, MMM. Oropharynx clear.  Neck: Supple.  CV: Regular rate and rhythm. No murmurs/rubs/gallops. No costochondral tenderness. No cyanosis or clubbing. Cap Refill < 2 secs  Pulm: CTAB. No wheezing, crackles, or rhonchi.  Abd: Tenderness to palpation in the right flank and right side of her abdomen.  Voluntary guarding without rebound. Positive CVA tenderness on the right.  Suprapubic tenderness appreciated.  Neuro: A&O x 3. No focal deficits. Strength 5/5 UE/LE.    Ext: Mild peripheral edema bilateral.  Palpable distal pulses. Firm area of upper LLE calf that is exquisitely tender to palpation. Without induration or ecchymosis. No erythema appreciated.   Skin: No rashes or skin lesions.     LABS/ STUDIES:    All imaging, laboratory studies, and other pertinent tests including electrocardiography within the last 24 hours were reviewed and are summarized within the assessment and plan.     NUTRITION:                       Josefa Half, MS4    I attest that I have reviewed the student note and that the components of the history of the present illness, the physical exam, and the assessment and plan documented were performed by me or were performed in my presence by the student where I verified the documentation and performed (or re-performed) the exam and medical decision making.     Katherine Roan, MD, PGY2  April 12, 2023 1:41 PM

## 2023-04-13 LAB — CBC
HEMATOCRIT: 40 % (ref 34.0–44.0)
HEMOGLOBIN: 13.5 g/dL (ref 11.3–14.9)
MEAN CORPUSCULAR HEMOGLOBIN CONC: 33.7 g/dL (ref 32.3–35.0)
MEAN CORPUSCULAR HEMOGLOBIN: 29.9 pg (ref 25.9–32.4)
MEAN CORPUSCULAR VOLUME: 88.5 fL (ref 77.6–95.7)
MEAN PLATELET VOLUME: 8.9 fL (ref 7.3–10.7)
PLATELET COUNT: 209 10*9/L (ref 170–380)
RED BLOOD CELL COUNT: 4.52 10*12/L (ref 3.95–5.13)
RED CELL DISTRIBUTION WIDTH: 13.4 % (ref 12.2–15.2)
WBC ADJUSTED: 8.2 10*9/L (ref 4.2–10.2)

## 2023-04-13 LAB — D-DIMER, QUANTITATIVE: D-DIMER QUANTITATIVE (CW,ML,HL,HS,CH,JS,JC,RX,RH): 462 ng{FEU}/mL (ref <=500)

## 2023-04-13 MED ORDER — FAMOTIDINE 20 MG TABLET
ORAL_TABLET | Freq: Two times a day (BID) | ORAL | 0 refills | 30 days | Status: CP
Start: 2023-04-13 — End: 2023-05-13
  Filled 2023-04-13: qty 60, 30d supply, fill #0

## 2023-04-13 MED ORDER — ACETAMINOPHEN 325 MG TABLET
Freq: Four times a day (QID) | ORAL | 0 refills | 0 days | Status: CN
Start: 2023-04-13 — End: ?

## 2023-04-13 MED ORDER — OXYBUTYNIN CHLORIDE ER 10 MG TABLET,EXTENDED RELEASE 24 HR
ORAL_TABLET | Freq: Every day | ORAL | 0 refills | 30 days | Status: CP
Start: 2023-04-13 — End: 2023-05-13

## 2023-04-13 MED ORDER — NICOTINE 21 MG/24 HR DAILY TRANSDERMAL PATCH
MEDICATED_PATCH | Freq: Every day | TRANSDERMAL | 0 refills | 28 days | Status: CN
Start: 2023-04-13 — End: ?

## 2023-04-13 MED ORDER — ACETAMINOPHEN 500 MG TABLET
ORAL_TABLET | Freq: Three times a day (TID) | ORAL | 0 refills | 9 days | Status: CP
Start: 2023-04-13 — End: 2023-04-22
  Filled 2023-04-13: qty 54, 9d supply, fill #0

## 2023-04-13 MED ORDER — KETOROLAC 10 MG TABLET
ORAL_TABLET | Freq: Four times a day (QID) | ORAL | 0 refills | 9.00000 days | Status: CP | PRN
Start: 2023-04-13 — End: 2023-04-22
  Filled 2023-04-13: qty 20, 5d supply, fill #0

## 2023-04-13 MED ORDER — NICOTINE (POLACRILEX) 4 MG GUM
BUCCAL | 0 refills | 5.00000 days | Status: CP | PRN
Start: 2023-04-13 — End: 2023-05-13
  Filled 2023-04-13: qty 28, 28d supply, fill #0
  Filled 2023-04-13: qty 110, 5d supply, fill #0

## 2023-04-13 MED ADMIN — ketorolac (TORADOL) injection 15 mg: 15 mg | INTRAVENOUS | @ 03:00:00 | Stop: 2023-04-14 | NDC 00409228831

## 2023-04-13 MED ADMIN — gabapentin (NEURONTIN) capsule 300 mg: 300 mg | ORAL | @ 12:00:00 | Stop: 2023-04-13 | NDC 76420004790

## 2023-04-13 MED ADMIN — cholecalciferol (vitamin D3 25 mcg (1,000 units)) tablet 25 mcg: 25 ug | ORAL | NDC 53191040901

## 2023-04-13 MED ADMIN — ketorolac (TORADOL) injection 15 mg: 15 mg | INTRAVENOUS | @ 12:00:00 | Stop: 2023-04-13 | NDC 00409228831

## 2023-04-13 MED ADMIN — acetaminophen (TYLENOL) tablet 650 mg: 650 mg | ORAL | @ 12:00:00 | Stop: 2023-04-13 | NDC 97807001074

## 2023-04-13 MED ADMIN — acetaminophen (TYLENOL) tablet 650 mg: 650 mg | ORAL | NDC 50580048790

## 2023-04-13 MED ADMIN — oxybutynin (DITROPAN) tablet 5 mg: 5 mg | ORAL | NDC 64376040216

## 2023-04-13 MED ADMIN — ketorolac (TORADOL) injection 15 mg: 15 mg | INTRAVENOUS | @ 18:00:00 | Stop: 2023-04-13 | NDC 00409228831

## 2023-04-13 MED ADMIN — oxybutynin (DITROPAN) tablet 5 mg: 5 mg | ORAL | @ 12:00:00 | Stop: 2023-04-13 | NDC 64376040216

## 2023-04-13 MED ADMIN — ondansetron (ZOFRAN-ODT) disintegrating tablet 8 mg: 8 mg | ORAL | @ 18:00:00 | Stop: 2023-04-13 | NDC 71930001852

## 2023-04-13 MED ADMIN — ondansetron (ZOFRAN) injection 4 mg: 4 mg | INTRAVENOUS | @ 12:00:00 | Stop: 2023-04-13 | NDC 71930001752

## 2023-04-13 MED ADMIN — gabapentin (NEURONTIN) capsule 300 mg: 300 mg | ORAL | NDC 76420004790

## 2023-04-13 MED ADMIN — acetaminophen (TYLENOL) tablet 650 mg: 650 mg | ORAL | @ 08:00:00 | Stop: 2023-04-13 | NDC 97807001074

## 2023-04-13 MED ADMIN — ondansetron (ZOFRAN) injection 4 mg: 4 mg | INTRAVENOUS | @ 02:00:00 | NDC 71930001752

## 2023-04-13 MED ADMIN — gabapentin (NEURONTIN) capsule 300 mg: 300 mg | ORAL | @ 18:00:00 | Stop: 2023-04-13 | NDC 76420004790

## 2023-04-13 NOTE — Unmapped (Signed)
Pt admitted for pyelonephritis, is A & O x 4, independent of ADL, VS WN, no acute changes noted this shift, c/o pain and medication given with good effect. Pt sleeping at this time. Will continue to monitor patient.   24 hr chart check done. Pt has lab draw earlier during the day but decline redraw, pt's wishes honored.   Problem: Adult Inpatient Plan of Care  Goal: Plan of Care Review  Outcome: Ongoing - Unchanged  Goal: Patient-Specific Goal (Individualized)  Outcome: Ongoing - Unchanged  Goal: Absence of Hospital-Acquired Illness or Injury  Outcome: Ongoing - Unchanged  Intervention: Identify and Manage Fall Risk  Recent Flowsheet Documentation  Taken 04/12/2023 2230 by Amil Amen, RN  Safety Interventions: fall reduction program maintained  Taken 04/12/2023 2000 by Amil Amen, RN  Safety Interventions: fall reduction program maintained  Goal: Optimal Comfort and Wellbeing  Outcome: Ongoing - Unchanged  Goal: Readiness for Transition of Care  Outcome: Ongoing - Unchanged  Goal: Rounds/Family Conference  Outcome: Ongoing - Unchanged     Problem: Self-Care Deficit  Goal: Improved Ability to Complete Activities of Daily Living  Outcome: Ongoing - Unchanged     Problem: Fall Injury Risk  Goal: Absence of Fall and Fall-Related Injury  Outcome: Ongoing - Unchanged  Intervention: Promote Injury-Free Environment  Recent Flowsheet Documentation  Taken 04/12/2023 2230 by Amil Amen, RN  Safety Interventions: fall reduction program maintained  Taken 04/12/2023 2000 by Amil Amen, RN  Safety Interventions: fall reduction program maintained

## 2023-04-13 NOTE — Unmapped (Signed)
Problem: Adult Inpatient Plan of Care  Goal: Plan of Care Review  Outcome: Progressing  Goal: Patient-Specific Goal (Individualized)  Outcome: Progressing  Goal: Absence of Hospital-Acquired Illness or Injury  Outcome: Progressing  Intervention: Identify and Manage Fall Risk  Recent Flowsheet Documentation  Taken 04/13/2023 0800 by Stephens November, RN  Safety Interventions:   low bed   fall reduction program maintained  Goal: Optimal Comfort and Wellbeing  Outcome: Progressing  Goal: Readiness for Transition of Care  Outcome: Progressing  Goal: Rounds/Family Conference  Outcome: Progressing     Problem: Self-Care Deficit  Goal: Improved Ability to Complete Activities of Daily Living  Outcome: Progressing     Problem: Fall Injury Risk  Goal: Absence of Fall and Fall-Related Injury  Outcome: Progressing  Intervention: Promote Injury-Free Environment  Recent Flowsheet Documentation  Taken 04/13/2023 0800 by Stephens November, RN  Safety Interventions:   low bed   fall reduction program maintained

## 2023-04-13 NOTE — Unmapped (Signed)
[facility]   Care Management     Patient is a 22 y.o. admitted on 04/10/2023 for pyelonephritis. Per review of the medical record and discussion with the treating team, the patient does not meet indicators for a full assessment at this time. CM will continue to assess for discharge needs and follow up, as indicated.    Joni Fears, RRT April 13, 2023 11:08 AM      Food Insecurity: No Food Insecurity (01/06/2023)    Hunger Vital Sign     Worried About Running Out of Food in the Last Year: Never true     Ran Out of Food in the Last Year: Never true      Financial Resource Strain: Low Risk  (01/06/2023)    Overall Financial Resource Strain (CARDIA)     Difficulty of Paying Living Expenses: Not hard at all     Housing/Utilities: Low Risk  (01/06/2023)    Housing/Utilities     Within the past 12 months, have you ever stayed: outside, in a car, in a tent, in an overnight shelter, or temporarily in someone else's home (i.e. couch-surfing)?: No     Are you worried about losing your housing?: No     Within the past 12 months, have you been unable to get utilities (heat, electricity) when it was really needed?: No     Transportation Needs: No Transportation Needs (01/14/2023)    PRAPARE - Therapist, art (Medical): No     Lack of Transportation (Non-Medical): No

## 2023-04-13 NOTE — Unmapped (Signed)
Inpatient Tobacco Cessation Counseling Note    This medical encounter was conducted virtually using [email or web address] protocols.    I have identified myself to the patient and conveyed my credentials to [name]  I have explained the capabilities and limitations of telemedicine and the patient/proxy and myself both agree that it is appropriate for their current circumstances/symptoms.     Contact Information  Person Contacted: Brandi Flynn Phone number: [phone]      Phone Outcome: spoke with pt  Is there someone else in the room? No.     Patient's location at the time of the telephone visit: Hospitalized at [facility]   Provider's location at the time of the telephone visit: At home, in [place]      Purpose of contact:     Tobacco Treatment Team (TTP) received inpatient consult from [facility] for Brandi Flynn, 22 y.o. female. Pt participated in a telephone visit for tobacco cessation counseling.  Patient was admitted to hospital for pyelonephritis. Patient consented to telephone visit given due to social isolation measures in place due to the COVID-19 pandemic.     Tobacco Use History and Assessment  Time Since Last Tobacco Use: 1 to 7 days ago  Tobacco Withdrawal (Past 24 Hours): None noted  Type of Tobacco Products Used: E-cigarette/vape  Quantity Used: 1  Quantity Per: day  Medications Used in Past Attempts: Nicotine Patch  Previously seen by NDP?: Hospital Inpatient    Behavioral Assessment  Why Uses: 1. habit 2. stress-relief  Reasons to Become Tobacco Free: health  Barriers/Challenges: 1. long-standing habit 2. stress  Strategies: pt can use 1. NRT to manage cravings 2. hand-to-mouth replacements    NOTE: Pt denies having cravings to vape and states she vapes daily. SW referenced conversation this Clinical research associate had with pt when she was in the hospital in April 2024. At that time, pt reported she was intentionally reducing the amount she vaped and she was open to cessation. Pt was polite and brief in call today, reviewing she feels frustrated that she may be discharged today when she is still experiencing nausea/vomiting. Pt reports she tried the nicotine patch and gum and felt they were ineffective. SW reviewed with pt at the time the weaker patch was ordered for her because pt stated she used the vape just a few times per day. Pt reports she uses her vape whenever she feels stressed and the nicotine concentration in her refillable vape is 30%. SW explained a stronger nicotine patch may be more effective and pt agrees to having one ordered; SW informed her the gum will be added prn as well. SW explained recommendation to become tobacco-free in context of pyelonephritis and symptoms of nausea/vomiting. SW reviewed toxic effects of vaping with a kidney injury/bacterial infection and implications for delayed healing. Pt is not motivated to quit, as she believes vaping helps manage her anxiety/stress. SW provided pt with contact information, physical improvements related to tobacco cessation, and available resources (including outpatient Tobacco Treatment Program at [facility] and  Quitline).    Treatment Plan  Please see below for medication recommendations in bold.   Cessation Meds Currently Using: None  Medications Recommended During Hospitalization: Patch 21mg , Gum 4mg   Outpatient/Discharge Medications Recommended: Patch 21mg , Gum 4mg   Plan to Obtain Outpatient Meds: TTS messaged providers for Rx  Patient's Plan Post Discharge/Visit: Does not plan to quit in next 6 months    As  part of this Telephone Visit, no in-person exam was conducted.     I personally spent 12 minutes counseling the patient via telephone about tobacco cessation.  I spent an additional 10 minutes on pre- and post-visit activities.      The patient was physically located in [place] or a state in which I am permitted to provide care. The patient and/or parent/guardian understood that s/he may incur co-pays and cost sharing, and agreed to the telemedicine visit. The visit was reasonable and appropriate under the circumstances given the patient's presentation at the time.     The patient and/or parent/guardian has been advised of the potential risks and limitations of this mode of treatment (including, but not limited to, the absence of in-person examination) and has agreed to be treated using telemedicine. The patient's/patient's family's questions regarding telemedicine have been answered.      If the visit was completed in an ambulatory setting, the patient and/or parent/guardian has also been advised to contact their provider???s office for worsening conditions, and seek emergency medical treatment and/or call 911 if the patient deems either necessary.     Visit Format/Coding: Telephone     Coding: 40102 (11-20 minutes)  Service rendered over the phone most consistent with: Tobacco cessation counseling, greater than 10 minutes (72536)      Venancio Poisson, LCSW, NCTTP  Clinical Social Worker / Tobacco Treatment Specialist  [facility]  Phone: [phone]

## 2023-04-13 NOTE — Unmapped (Signed)
Problem: Adult Inpatient Plan of Care  Goal: Plan of Care Review  04/13/2023 1524 by Stephens November, RN  Outcome: Resolved  04/13/2023 0901 by Stephens November, RN  Outcome: Progressing  Goal: Patient-Specific Goal (Individualized)  04/13/2023 1524 by Stephens November, RN  Outcome: Resolved  04/13/2023 0901 by Stephens November, RN  Outcome: Progressing  Goal: Absence of Hospital-Acquired Illness or Injury  04/13/2023 1524 by Stephens November, RN  Outcome: Resolved  04/13/2023 0901 by Stephens November, RN  Outcome: Progressing  Intervention: Identify and Manage Fall Risk  Recent Flowsheet Documentation  Taken 04/13/2023 0800 by Stephens November, RN  Safety Interventions:   low bed   fall reduction program maintained  Goal: Optimal Comfort and Wellbeing  04/13/2023 1524 by Stephens November, RN  Outcome: Resolved  04/13/2023 0901 by Stephens November, RN  Outcome: Progressing  Goal: Readiness for Transition of Care  04/13/2023 1524 by Stephens November, RN  Outcome: Resolved  04/13/2023 0901 by Stephens November, RN  Outcome: Progressing  Goal: Rounds/Family Conference  04/13/2023 1524 by Stephens November, RN  Outcome: Resolved  04/13/2023 0901 by Stephens November, RN  Outcome: Progressing     Problem: Self-Care Deficit  Goal: Improved Ability to Complete Activities of Daily Living  04/13/2023 1524 by Stephens November, RN  Outcome: Resolved  04/13/2023 0901 by Stephens November, RN  Outcome: Progressing     Problem: Fall Injury Risk  Goal: Absence of Fall and Fall-Related Injury  04/13/2023 1524 by Stephens November, RN  Outcome: Resolved  04/13/2023 0901 by Stephens November, RN  Outcome: Progressing  Intervention: Promote Injury-Free Environment  Recent Flowsheet Documentation  Taken 04/13/2023 0800 by Stephens November, RN  Safety Interventions:   low bed   fall reduction program maintained

## 2023-04-14 MED ORDER — NICOTINE 21 MG/24 HR DAILY TRANSDERMAL PATCH
MEDICATED_PATCH | Freq: Every day | TRANSDERMAL | 0 refills | 28 days | Status: CP
Start: 2023-04-14 — End: ?

## 2023-04-17 LAB — URINALYSIS WITH MICROSCOPY WITH CULTURE REFLEX PERFORMABLE
BILIRUBIN UA: NEGATIVE
GLUCOSE UA: 200 — AB
KETONES UA: NEGATIVE
NITRITE UA: NEGATIVE
PH UA: 7 (ref 5.0–9.0)
PROTEIN UA: 200 — AB
RBC UA: >182 /HPF — ABNORMAL HIGH (ref <=4)
SPECIFIC GRAVITY UA: 1.024 (ref 1.003–1.030)
SQUAMOUS EPITHELIAL: 4 /HPF (ref 0–5)
UROBILINOGEN UA: <2
WBC UA: 4 /HPF (ref 0–5)

## 2023-04-17 LAB — COMPREHENSIVE METABOLIC PANEL
ALBUMIN: 3.7 g/dL (ref 3.4–5.0)
ALKALINE PHOSPHATASE: 55 U/L (ref 46–116)
ALT (SGPT): 42 U/L (ref 10–49)
ANION GAP: 7 mmol/L (ref 5–14)
AST (SGOT): 31 U/L (ref <=34)
BILIRUBIN TOTAL: 0.3 mg/dL (ref 0.3–1.2)
BLOOD UREA NITROGEN: 10 mg/dL (ref 9–23)
BUN / CREAT RATIO: 15
CALCIUM: 9.4 mg/dL (ref 8.7–10.4)
CHLORIDE: 106 mmol/L (ref 98–107)
CO2: 27 mmol/L (ref 20.0–31.0)
CREATININE: 0.68 mg/dL
EGFR CKD-EPI (2021) FEMALE: >90 mL/min/{1.73_m2} (ref >=60)
GLUCOSE RANDOM: 79 mg/dL (ref 70–179)
POTASSIUM: 4 mmol/L (ref 3.4–4.8)
PROTEIN TOTAL: 7.2 g/dL (ref 5.7–8.2)
SODIUM: 140 mmol/L (ref 135–145)

## 2023-04-17 LAB — CBC W/ AUTO DIFF
BASOPHILS ABSOLUTE COUNT: 0.1 10*9/L (ref 0.0–0.1)
BASOPHILS RELATIVE PERCENT: 0.6 %
EOSINOPHILS ABSOLUTE COUNT: 0.3 10*9/L (ref 0.0–0.5)
EOSINOPHILS RELATIVE PERCENT: 2.6 %
HEMATOCRIT: 42.2 % (ref 34.0–44.0)
HEMOGLOBIN: 14.5 g/dL (ref 11.3–14.9)
LYMPHOCYTES ABSOLUTE COUNT: 2.4 10*9/L (ref 1.1–3.6)
LYMPHOCYTES RELATIVE PERCENT: 23.1 %
MEAN CORPUSCULAR HEMOGLOBIN CONC: 34.3 g/dL (ref 32.3–35.0)
MEAN CORPUSCULAR HEMOGLOBIN: 29.7 pg (ref 25.9–32.4)
MEAN CORPUSCULAR VOLUME: 86.8 fL (ref 77.6–95.7)
MEAN PLATELET VOLUME: 8.5 fL (ref 7.3–10.7)
MONOCYTES ABSOLUTE COUNT: 0.9 10*9/L — ABNORMAL HIGH (ref 0.3–0.8)
MONOCYTES RELATIVE PERCENT: 8.6 %
NEUTROPHILS ABSOLUTE COUNT: 6.7 10*9/L — ABNORMAL HIGH (ref 1.5–6.4)
NEUTROPHILS RELATIVE PERCENT: 65.1 %
PLATELET COUNT: 232 10*9/L (ref 170–380)
RED BLOOD CELL COUNT: 4.87 10*12/L (ref 3.95–5.13)
RED CELL DISTRIBUTION WIDTH: 13.5 % (ref 12.2–15.2)
WBC ADJUSTED: 10.2 10*9/L (ref 4.2–10.2)

## 2023-04-17 LAB — HCG QUANTITATIVE, BLOOD: GONADOTROPIN, CHORIONIC (HCG) QUANT: <2.6 m[IU]/mL

## 2023-04-18 ENCOUNTER — Encounter
Admit: 2023-04-18 | Discharge: 2023-04-23 | Disposition: A | Discharge status: Home / Self Care | Payer: PRIVATE HEALTH INSURANCE

## 2023-04-18 ENCOUNTER — Ambulatory Visit: Admit: 2023-04-18 | Payer: PRIVATE HEALTH INSURANCE

## 2023-04-18 ENCOUNTER — Ambulatory Visit
Admit: 2023-04-18 | Discharge: 2023-04-23 | Disposition: A | Discharge status: Home / Self Care | Payer: PRIVATE HEALTH INSURANCE

## 2023-04-18 ENCOUNTER — Ambulatory Visit: Admit: 2023-04-18 | Discharge: 2023-04-18 | Payer: PRIVATE HEALTH INSURANCE

## 2023-04-18 LAB — COMPREHENSIVE METABOLIC PANEL
ALBUMIN: 3.7 g/dL (ref 3.4–5.0)
ALKALINE PHOSPHATASE: 53 U/L (ref 46–116)
ALT (SGPT): 42 U/L (ref 10–49)
ANION GAP: 4 mmol/L — ABNORMAL LOW (ref 5–14)
AST (SGOT): 32 U/L (ref <=34)
BILIRUBIN TOTAL: 0.6 mg/dL (ref 0.3–1.2)
BLOOD UREA NITROGEN: 12 mg/dL (ref 9–23)
BUN / CREAT RATIO: 18
CALCIUM: 9.5 mg/dL (ref 8.7–10.4)
CHLORIDE: 107 mmol/L (ref 98–107)
CO2: 28 mmol/L (ref 20.0–31.0)
CREATININE: 0.67 mg/dL
EGFR CKD-EPI (2021) FEMALE: >90 mL/min/{1.73_m2} (ref >=60)
GLUCOSE RANDOM: 115 mg/dL (ref 70–179)
POTASSIUM: 4.3 mmol/L (ref 3.4–4.8)
PROTEIN TOTAL: 7.3 g/dL (ref 5.7–8.2)
SODIUM: 139 mmol/L (ref 135–145)

## 2023-04-18 LAB — URINALYSIS WITH MICROSCOPY WITH CULTURE REFLEX PERFORMABLE
BILIRUBIN UA: NEGATIVE
GLUCOSE UA: NEGATIVE
KETONES UA: NEGATIVE
NITRITE UA: NEGATIVE
PH UA: 7 (ref 5.0–9.0)
RBC UA: >182 /HPF — ABNORMAL HIGH (ref <=4)
SPECIFIC GRAVITY UA: 1.013 (ref 1.003–1.030)
SQUAMOUS EPITHELIAL: 1 /HPF (ref 0–5)
UROBILINOGEN UA: <2
WBC UA: 15 /HPF — ABNORMAL HIGH (ref 0–5)

## 2023-04-18 LAB — CBC W/ AUTO DIFF
BASOPHILS ABSOLUTE COUNT: 0.1 10*9/L (ref 0.0–0.1)
BASOPHILS RELATIVE PERCENT: 0.5 %
EOSINOPHILS ABSOLUTE COUNT: 0.2 10*9/L (ref 0.0–0.5)
EOSINOPHILS RELATIVE PERCENT: 1.7 %
HEMATOCRIT: 41.7 % (ref 34.0–44.0)
HEMOGLOBIN: 14.2 g/dL (ref 11.3–14.9)
LYMPHOCYTES ABSOLUTE COUNT: 1.5 10*9/L (ref 1.1–3.6)
LYMPHOCYTES RELATIVE PERCENT: 14.6 %
MEAN CORPUSCULAR HEMOGLOBIN CONC: 34 g/dL (ref 32.3–35.0)
MEAN CORPUSCULAR HEMOGLOBIN: 30 pg (ref 25.9–32.4)
MEAN CORPUSCULAR VOLUME: 88.1 fL (ref 77.6–95.7)
MEAN PLATELET VOLUME: 8.8 fL (ref 7.3–10.7)
MONOCYTES ABSOLUTE COUNT: 0.7 10*9/L (ref 0.3–0.8)
MONOCYTES RELATIVE PERCENT: 7 %
NEUTROPHILS ABSOLUTE COUNT: 7.6 10*9/L — ABNORMAL HIGH (ref 1.5–6.4)
NEUTROPHILS RELATIVE PERCENT: 76.2 %
NUCLEATED RED BLOOD CELLS: 0 /100{WBCs} (ref <=4)
PLATELET COUNT: 225 10*9/L (ref 170–380)
RED BLOOD CELL COUNT: 4.73 10*12/L (ref 3.95–5.13)
RED CELL DISTRIBUTION WIDTH: 13.6 % (ref 12.2–15.2)
WBC ADJUSTED: 10 10*9/L (ref 4.2–10.2)

## 2023-04-18 MED ORDER — OXYCODONE 5 MG TABLET
ORAL_TABLET | ORAL | 0 refills | 2 days | Status: SS | PRN
Start: 2023-04-18 — End: 2023-04-23

## 2023-04-18 MED ORDER — METOCLOPRAMIDE 10 MG TABLET
ORAL_TABLET | Freq: Four times a day (QID) | ORAL | 0 refills | 7 days | Status: SS
Start: 2023-04-18 — End: 2023-04-25

## 2023-04-18 MED ORDER — GABAPENTIN 300 MG CAPSULE
ORAL_CAPSULE | Freq: Three times a day (TID) | ORAL | 0 refills | 30 days | Status: SS
Start: 2023-04-18 — End: 2023-05-18

## 2023-04-18 MED ADMIN — ketorolac (TORADOL) injection 15 mg: 15 mg | INTRAVENOUS | @ 03:00:00 | Stop: 2023-04-17 | NDC 00409228831

## 2023-04-18 MED ADMIN — acetaminophen (TYLENOL) tablet 650 mg: 650 mg | ORAL | @ 07:00:00 | Stop: 2023-04-18 | NDC 50580048790

## 2023-04-18 MED ADMIN — ondansetron (ZOFRAN) injection 4 mg: 4 mg | INTRAVENOUS | @ 02:00:00 | Stop: 2023-04-17 | NDC 71930001752

## 2023-04-18 NOTE — Unmapped (Signed)
[facility]  Emergency Department Provider Note    ED Clinical Impression     Final diagnoses:   None       HPI, ED Course, Assessment and Plan     Initial Clinical Impression:    April 17, 2023 9:13 PM   Brandi Flynn is a 22 y.o. female with past medical history of MS presenting with flank and abdominal pain.  She was recently admitted from 7/14 through 7/17 for presumed pyelonephritis, but Rocephin was discontinued when her urine culture came back negative.  She was feeling better at time of discharge, but since then, has had progressively worsening right-sided flank pain which now has worsened to involve the left flank in all 4 quadrants of her abdomen.  She has associated nausea and vomiting.  No fever but has had chills, no diarrhea.  She is taking oxybutynin, gabapentin, but is not currently on antibiotics.  She was diagnosed with a 6 x 4mm right-sided kidney stone in June, has required stenting, has been deal with more symptoms on and off since January of this year.    BP 133/67  - Pulse 85  - Temp 37 ??C (98.6 ??F) (Oral)  - Resp 20  - SpO2 100%     Medical Decision Making    Further ED updates and updates to plan as per ED Course below:    ED Course:  ED Course as of 04/18/23 0006   Sun Apr 17, 2023   1871 22 year old female with past medical history as below including MS and known right-sided nephrolithiasis, which has been complicated by requiring stenting, pyelonephritis, just discharged on Wednesday last week here for worsening abdominal pain.  Given her history, I am concerned for possible infected stone or stent complication.  Also considered other causes of intra-abdominal pathology, including appendicitis, hepatobiliary pathology, pancreatitis.  Labs including pregnancy to evaluate, will get CT scan with contrast given that she says that this pain is the worst that she is experienced while dealing with this kidney stone and however that the pyelonephritis.  Will give Zofran, morphine, reassess.   2246 Patient declined morphine, requested Toradol, will order.   Reviewed initial labs, no acidosis or anemia, normal platelets, nonactionable electrolytes, no AKI, normal liver enzymes.  Negative pregnancy.  UA with hematuria but otherwise not consistent with infection.  Given recent similar symptoms, with negative urine culture, will hold off on antibiotics at this time.  Patient is still pending her CT.  Will sign out patient to oncoming team at 1 AM pending CT, reassessment, disposition.      External Records Reviewed: I have reviewed recent and relevant previous record, including: Inpatient notes - 04/10/2023-04/13/2023 Hospital Admission Summary    Per chart review, patient presented to ED on 04/10/2023 with progressive N/V, right flank pain, and tachycardia with prior hospitalization for similar. Despite still having some pain and nausea, the patient was discharged on 04/13/2023 and was able to ambulate independently, urinate without difficulty and tolerate sufficient PO to maintain adequate hydration and nutrition.    Social Determinants of Health with Concerns     Internet Connectivity: Not on file   Tobacco Use: High Risk (04/17/2023)    Patient History     Smoking Tobacco Use: Every Day     Smokeless Tobacco Use: Never     Passive Exposure: Not on file   Alcohol Use: Not on file   Substance Use: Not on file   Health Literacy: Medium Risk (01/14/2023)  Health Literacy     : Sometimes   Physical Activity: Not on file   Interpersonal Safety: Unknown (04/17/2023)    Interpersonal Safety     Unsafe Where You Currently Live: Not on file     Physically Hurt by Anyone: Not on file     Abused by Anyone: Not on file   Stress: Not on file   Intimate Partner Violence: Not on file   Social Connections: Not on file     _____________________________________________________________________    The case was discussed with the attending physician who is in agreement with the above assessment and plan    Past History PAST MEDICAL HISTORY/PAST SURGICAL HISTORY:   Past Medical History:   Diagnosis Date    Disease of thyroid gland     Multiple sclerosis (CMS-HCC)     Obesity     Vertigo        Past Surgical History:   Procedure Laterality Date    PR CYSTOSCOPY,INSERT URETERAL STENT Right 03/26/2023    Procedure: CYSTOURETHROSCOPY,  WITH INSERTION OF INDWELLING URETERAL STENT (EG, GIBBONS OR DOUBLE-J TYPE);  Surgeon: Annye English, MD;  Location: MAIN OR [facility];  Service: Urology    PR CYSTOURETHROSCOPY,URETER CATHETER Right 03/26/2023    Procedure: CYSTOURETHROSCOPY, W/URETERAL CATHETERIZATION, W/WO IRRIG, INSTILL, OR URETEROPYELOG, EXCLUS OF RADIOLG SVC;  Surgeon: Annye English, MD;  Location: MAIN OR [facility];  Service: Urology       MEDICATIONS:   No current facility-administered medications for this encounter.    Current Outpatient Medications:     acetaminophen (TYLENOL EXTRA STRENGTH) 500 MG tablet, Take 2 tablets (1,000 mg total) by mouth every eight (8) hours for 9 days., Disp: 54 tablet, Rfl: 0    carboxymethylcellulose sodium (REFRESH CELLUVISC) 1 % DpGe, Administer 1 drop to both eyes four (4) times a day., Disp: 1 each, Rfl: 0    cetirizine (ZYRTEC) 10 MG tablet, Take 1 tablet (10 mg total) by mouth daily as needed for allergies., Disp: 30 tablet, Rfl: 0    cholecalciferol, vitamin D3 25 mcg, 1,000 units,, (CHOLECALCIFEROL-25 MCG, 1,000 UNIT,) 1,000 unit (25 mcg) tablet, Take 1 tablet (25 mcg total) by mouth daily., Disp: , Rfl:     famotidine (PEPCID) 20 MG tablet, Take 1 tablet (20 mg total) by mouth two (2) times a day., Disp: 60 tablet, Rfl: 0    gabapentin (NEURONTIN) 300 MG capsule, Take 1 capsule (300 mg total) by mouth Three (3) times a day., Disp: 270 capsule, Rfl: 3    ketorolac (TORADOL) 10 mg tablet, Take 1 tablet (10 mg total) by mouth every six (6) hours as needed for pain for up to 9 days., Disp: 36 tablet, Rfl: 0    loperamide (IMODIUM A-D) 2 mg tablet, Take 1 tablet (2 mg total) by mouth four (4) times a day as needed for diarrhea., Disp: 30 tablet, Rfl: 0    nicotine (NICODERM CQ) 21 mg/24 hr patch, Place 1 patch on the skin daily., Disp: 28 patch, Rfl: 0    nicotine polacrilex (NICORETTE) 4 MG gum, Apply 1 each (4 mg total) to cheek every hour as needed for smoking cessation., Disp: 110 each, Rfl: 0    nystatin (MYCOSTATIN) 100,000 unit/gram powder, Apply 1 Application topically two (2) times a day., Disp: 60 g, Rfl: 1    ondansetron (ZOFRAN-ODT) 8 MG disintegrating tablet, Dissolve 1 tablet (8 mg total) in the mouth every eight (8) hours as needed for nausea., Disp: , Rfl:  oxybutynin (DITROPAN XL) 10 MG 24 hr tablet, Take 1 tablet (10 mg total) by mouth daily., Disp: 30 tablet, Rfl: 0    ALLERGIES:   Blueberry and Peanut    SOCIAL HISTORY:   Social History     Tobacco Use    Smoking status: Every Day     Types: e-Cigarettes    Smokeless tobacco: Never    Tobacco comments:     Vapes   Substance Use Topics    Alcohol use: Not Currently       FAMILY HISTORY:  Family History   Problem Relation Age of Onset    Thyroid disease Mother     Thyroid disease Maternal Grandmother     Thyroid disease Maternal Grandfather     Thyroid disease Paternal Grandmother     Vitiligo Paternal Grandmother           Review of Systems     A review of systems was performed and relevant portions were as noted above in HPI     Physical Exam     VITAL SIGNS:    BP 133/67  - Pulse 85  - Temp 37 ??C (98.6 ??F) (Oral)  - Resp 20  - SpO2 100%       Constitutional:   Alert and oriented.   Head:   Normocephalic and atraumatic  Eyes:   Conjunctivae are normal, EOMI, PERRL  ENT:   No notable congestion, Mucous membranes moist, External ears normal, no notable stridor  Cardiovascular:   Rate as vitals above. Appears warm and well perfused  Respiratory:   Normal respiratory effort. Breath sounds are normal.  Gastrointestinal:   Soft, non-distended, tender to palpation in all 4 quadrants without rebound or guarding, tender to palpation over the right flank  Genitourinary:   Deferred  Musculoskeletal:    Normal range of motion in all extremities. No tenderness or edema noted in B/L lower extremities  Neurologic:   No gross focal neurologic deficits beyond baseline are appreciated.  Skin:   Skin is warm, dry and intact.       Radiology     No orders to display       Labs     Labs Reviewed   URINALYSIS WITH MICROSCOPY WITH CULTURE REFLEX PERFORMABLE - Abnormal; Notable for the following components:       Result Value    Leukocyte Esterase, UA Moderate (*)     Protein, UA 200 mg/dL (*)     Glucose, UA 295 mg/dL (*)     Blood, UA Large (*)     RBC, UA >182 (*)     Bacteria, UA Rare (*)     All other components within normal limits   POCT GLUCOSE, INTERFACED - Normal   URINE CULTURE   URINALYSIS WITH MICROSCOPY WITH CULTURE REFLEX    Narrative:     The following orders were created for panel order Urinalysis with Microscopy with Culture Reflex.                  Procedure                               Abnormality         Status                                     ---------                               -----------         ------  Urinalysis with Microsc.Marland KitchenMarland Kitchen[[id number]]  Abnormal            Final result                                                 Please view results for these tests on the individual orders.   CBC W/ DIFFERENTIAL    Narrative:     The following orders were created for panel order CBC w/ Differential.                  Procedure                               Abnormality         Status                                     ---------                               -----------         ------                                     CBC w/ Differential[[id number]]                                                                                          Please view results for these tests on the individual orders.   COMPREHENSIVE METABOLIC PANEL   HCG QUANTITATIVE, BLOOD   POCT GLUCOSE-RN OBTAIN   CBC W/ AUTO DIFF Pertinent labs & imaging results that were available during my care of the patient were reviewed by me and considered in my medical decision making (see chart for details).    Please note- This chart has been created using [employer]. Chart creation errors have been sought, but may not always be located and such creation errors, especially pronoun confusion, do NOT reflect on the standard of medical care.    Documentation assistance was provided by Altamese Cabal, Scribe, on April 17, 2023 at 9:31 PM for Elyse Hsu, MD.    Documentation assistance was provided by the scribe in my presence.  The documentation recorded by the scribe has been reviewed by me and accurately reflects the services I personally performed.       Raechel Ache, MD  Resident  04/18/23 [phone]

## 2023-04-18 NOTE — Unmapped (Signed)
Received sign out from previous provider.    Patient Summary: Brandi Flynn is a 22 y.o. female with a history of MS and renal stone complicated by pyelonephritis status post stenting, presenting with flank pain and abdominal pain.  Patient was a difficult stick and 3 attempts at ultrasound IV cannulation were unsuccessful.  CT abdomen pelvis pending.  Action List:   Follow-up CT abdomen pelvis  Disposition    Updates  ED Course as of 04/18/23 0355   Mon Apr 18, 2023   0130 CT abdomen pelvis without contrast  IMPRESSION:  Migration of the proximal ureteral stent into the ureteropelvic junction with new mild right hydronephrosis.     New nonobstructive 6 cm calculus in the right lower pole kidney.     Nonspecific right periureteral inflammatory stranding, possibly related to indwelling stent migration or suggestive of ascending urinary tract infection; new since 03/30/2023 CT.     Additional chronic/incidental findings as described in the body of the report.     [id number] Urology paged.    1610 Spoke to urology who will review the imaging and provide additional recommendations.   [facility] evaluated the patient and discussed her situation at length.  The stent will be left in place and the patient will complete lithotripsy as scheduled.  We will prescribe additional medications for pain.  Patient is amenable to this plan.  Return precautions provided.

## 2023-04-18 NOTE — Unmapped (Signed)
Pt BIB [facility] from home for c/o flank pain and N/V. Recently admitted for pyelonephritis. Has surgery scheduled for this Friday.

## 2023-04-18 NOTE — Unmapped (Signed)
UROLOGY CONSULT NOTE    Requesting Attending Physician:  No att. providers found  Service Requesting Consult:  Emergency Medicine  Service Providing Consult: SRU  Consulting Attending: Dr. Damien Fusi    Assessment:  Patient is a 22 y.o. female with ith past medical history of MS presenting with flank and abdominal pain from ureteral stent placed on 03/26/23 for her right mid ureteral kidney stone. Physical exam benign. Creatinine not elevated. No elevated WBC, No fevers. Ct shows that proximal curl of stent migrated to Right UPJ.  Mild hydro on right kidney present. Patient has planned laser lithotrypsy and stent removal planned for this Friday. Patient wants her stent removed but we spoke about how this would mostly likely bring her initial stone pain back and would also cause more pain from her previous obstruction and UTI potentially returning. We opted to increase her pain regimen until her surgery on Friday.         Recommendations:  Low dose oxycodone until Friday  Increase gabapentin dose to 600 TID  3. Increase tylenol from q8 to q6 hours  4. Continue zofran for nausea, add second line nausea agent  5. 1 L bolus fluids while in ED.   6. Continue with plan for Laser lithotrypsy this Friday.     Discussed with Dr. Zettie Pho, MD.  Thank you for this consult. Please page [phone] with any questions or concerns.      CT abdomen pelvis without contrast   Preliminary Result   Migration of the proximal ureteral stent into the ureteropelvic junction with new mild right hydronephrosis.      New nonobstructive 6 cm calculus in the right lower pole kidney.      Nonspecific right periureteral inflammatory stranding, possibly related to indwelling stent migration or suggestive of ascending urinary tract infection; new since 03/30/2023 CT.      Additional chronic/incidental findings as described in the body of the report.             lt. Please page [phone] with any questions or concerns.    History of Present Illness: Brandi Flynn is seen in consultation for pain from ureteral stent at the request of  of the Emergency Medicine team.     22 y.o. female with past medical history of MS presenting with flank and abdominal pain.      Patient is s/p right ureteral stent placement on 03/26/23 for right mid ureteral stone.  Stented due to pain, obstruction,  and infection. She has had intolerable stent pain from the placement of the stent but it has gotten worse over the past 3 days. Stone pain has been intermittent since January of this year.     She was recently admitted from 7/14 through 7/17 for presumed pyelonephritis, but Rocephin was discontinued when her urine culture came back negative.  She was feeling better at time of discharge, but since then, has had progressively worsening right-sided flank pain and nausea and vomiting making it difficult to keep food/meds down.      She has associated nausea and vomiting.  No fever but has had  subjective chills.       Past Medical History:  Past Medical History:   Diagnosis Date    Disease of thyroid gland     Multiple sclerosis (CMS-HCC)     Obesity     Vertigo        Past Surgical History:   Past Surgical History:   Procedure Laterality Date  PR CYSTOSCOPY,INSERT URETERAL STENT Right 03/26/2023    Procedure: CYSTOURETHROSCOPY,  WITH INSERTION OF INDWELLING URETERAL STENT (EG, GIBBONS OR DOUBLE-J TYPE);  Surgeon: Annye English, MD;  Location: MAIN OR [facility];  Service: Urology    PR CYSTOURETHROSCOPY,URETER CATHETER Right 03/26/2023    Procedure: CYSTOURETHROSCOPY, W/URETERAL CATHETERIZATION, W/WO IRRIG, INSTILL, OR URETEROPYELOG, EXCLUS OF RADIOLG SVC;  Surgeon: Annye English, MD;  Location: MAIN OR [facility];  Service: Urology       Medication:  Current Facility-Administered Medications   Medication Dose Route Frequency Provider Last Rate Last Admin    iohexol (OMNIPAQUE) 350 mg iodine/mL solution 100 mL  100 mL Intravenous Once in imaging Halsey-Nichols, Rosie Fate, MD Current Outpatient Medications   Medication Sig Dispense Refill    acetaminophen (TYLENOL EXTRA STRENGTH) 500 MG tablet Take 2 tablets (1,000 mg total) by mouth every eight (8) hours for 9 days. 54 tablet 0    carboxymethylcellulose sodium (REFRESH CELLUVISC) 1 % DpGe Administer 1 drop to both eyes four (4) times a day. 1 each 0    cetirizine (ZYRTEC) 10 MG tablet Take 1 tablet (10 mg total) by mouth daily as needed for allergies. 30 tablet 0    cholecalciferol, vitamin D3 25 mcg, 1,000 units,, (CHOLECALCIFEROL-25 MCG, 1,000 UNIT,) 1,000 unit (25 mcg) tablet Take 1 tablet (25 mcg total) by mouth daily.      famotidine (PEPCID) 20 MG tablet Take 1 tablet (20 mg total) by mouth two (2) times a day. 60 tablet 0    gabapentin (NEURONTIN) 300 MG capsule Take 2 capsules (600 mg total) by mouth Three (3) times a day. 180 capsule 0    ketorolac (TORADOL) 10 mg tablet Take 1 tablet (10 mg total) by mouth every six (6) hours as needed for pain for up to 9 days. 36 tablet 0    loperamide (IMODIUM A-D) 2 mg tablet Take 1 tablet (2 mg total) by mouth four (4) times a day as needed for diarrhea. 30 tablet 0    metoclopramide (REGLAN) 10 MG tablet Take 1 tablet (10 mg total) by mouth every six (6) hours for 7 days. 28 tablet 0    nicotine (NICODERM CQ) 21 mg/24 hr patch Place 1 patch on the skin daily. 28 patch 0    nicotine polacrilex (NICORETTE) 4 MG gum Apply 1 each (4 mg total) to cheek every hour as needed for smoking cessation. 110 each 0    nystatin (MYCOSTATIN) 100,000 unit/gram powder Apply 1 Application topically two (2) times a day. 60 g 1    ondansetron (ZOFRAN-ODT) 8 MG disintegrating tablet Dissolve 1 tablet (8 mg total) in the mouth every eight (8) hours as needed for nausea.      oxybutynin (DITROPAN XL) 10 MG 24 hr tablet Take 1 tablet (10 mg total) by mouth daily. 30 tablet 0    oxyCODONE (ROXICODONE) 5 MG immediate release tablet Take 1 tablet (5 mg total) by mouth every four (4) hours as needed for pain for up to 5 days. 10 tablet 0       Allergies:  Allergies   Allergen Reactions    Blueberry Hives    Peanut Swelling     Swelling tongue       Social History:  Social History     Tobacco Use    Smoking status: Every Day     Types: e-Cigarettes    Smokeless tobacco: Never    Tobacco comments:     Vapes   Vaping Use  Vaping status: Never Used   Substance Use Topics    Alcohol use: Not Currently    Drug use: Never       Family History:  Family History   Problem Relation Age of Onset    Thyroid disease Mother     Thyroid disease Maternal Grandmother     Thyroid disease Maternal Grandfather     Thyroid disease Paternal Grandmother     Vitiligo Paternal Grandmother        Review of Systems:  10 systems were reviewed and are negative except as noted specifically in the HPI.    Objective:     Intake/Output last 3 shifts:  No intake/output data recorded.  Vital signs in last 24 hours:  BP 147/92  - Pulse 64  - Temp 36.6 ??C (97.9 ??F) (Oral)  - Resp 18  - SpO2 100%     Physical Exam:  General:  Mild distress, well appearing and well nourished  HEENT: Normocephalic, atraumatic, pupils equal and round, sclera anicteric  Lungs:   Normal work of breathing on room air  Cardiac: Regular rate  Abdomen: TTP diffusly, soft, non distended.  GU:  Right  CVA tenderness. Voiding spontaneously  Extremities: Warm and well perfused  Neuro:             Alert and oriented, strength and sensation grossly normal      Most Recent Labs:  Recent Labs     Units 04/12/23  0630 04/13/23  0622 04/17/23  2202   WBC 10*9/L 9.0 8.2 10.2   RBC 10*12/L 4.51 4.52 4.87   HGB g/dL 16.1 09.6 04.5   HCT % 40.0 40.0 42.2   MCV fL 88.6 88.5 86.8   MCH pg 29.5 29.9 29.7   MCHC g/dL 40.9 81.1 91.4   RDW % 13.4 13.4 13.5   PLT 10*9/L 215 209 232   MPV fL 9.0 8.9 8.5     Recent Labs     Units 04/11/23  0730 04/12/23  0630 04/17/23  2202   NA mmol/L 143 144 140   K mmol/L 3.9 4.4 4.0   CL mmol/L 106 109* 106   CO2 mmol/L 31.2* 28.9 27.0   BUN mg/dL 12 9 10  CREATININE mg/dL 7.82 9.56 2.13   GLU mg/dL 086 578 79     Recent Labs     Units 04/17/23  2202   ALT U/L 42   AST U/L 31   ALKPHOS U/L 55   ALBUMIN g/dL 3.7   PROT g/dL 7.2   BILITOT mg/dL 0.3     No results for input(s): INR, APTT in the last 168 hours.    Microbiology Data:  Blood Culture, Routine   Date Value Ref Range Status   03/27/2023 No Growth at 5 days  Final   03/27/2023 No Growth at 5 days  Final     Urine Culture, Comprehensive   Date Value Ref Range Status   04/10/2023 Mixed Urogenital Flora  Final   04/08/2023 Mixed Urogenital Flora  Final   04/07/2023 Mixed Urogenital Flora  Final   03/30/2023 10,000 to 50,000 CFU/mL Escherichia coli (A)  Final   03/30/2023 10,000 to 50,000 CFU/mL Mixed Urogenital Flora (A)  Final   03/27/2023 Mixed Urogenital Flora  Final   03/25/2023 Mixed Urogenital Flora  Final     No results found for: ANACX  Most recent Urinalysis:  Recent Labs     Units 04/17/23  1812   LEUKOCYTESUR  Moderate*  NITRITE  Negative   RBCUA /HPF >182*   WBCUA /HPF 4   SQUEPIU /HPF 4   BACTERIA /HPF Rare*      Urinalysis History:  Leukocyte Esterase, UA   Date Value Ref Range Status   04/17/2023 Moderate (A) Negative Final   04/10/2023 Large (A) Negative Final   04/08/2023 Moderate (A) Negative Final   04/07/2023 Moderate (A) Negative Final   03/30/2023 Moderate (A) Negative Final   03/27/2023 Large (A) Negative Final   03/25/2023 Small (A) Negative Final     Leukocytes, UA   Date Value Ref Range Status   03/25/2023 Trace (A) Negative Final   01/25/2023 Negative Negative Final     Nitrite, UA   Date Value Ref Range Status   04/17/2023 Negative Negative Final   04/10/2023 Negative Negative Final   04/08/2023 Negative Negative Final   04/07/2023 Negative Negative Final   03/30/2023 Negative Negative Final   03/27/2023 Negative Negative Final   03/25/2023 Negative Negative Final   03/25/2023 Negative Negative Final   01/25/2023 Negative Negative Final     RBC, UA   Date Value Ref Range Status 04/17/2023 >182 (H) <=4 /HPF Final   04/10/2023 >182 (H) <=4 /HPF Final   04/08/2023 >182 (H) <=4 /HPF Final   04/07/2023 >182 (H) <=4 /HPF Final   03/30/2023 >182 (H) <=4 /HPF Final   03/27/2023 >182 (H) <=4 /HPF Final   03/25/2023 12 (H) <=4 /HPF Final     WBC, UA   Date Value Ref Range Status   04/17/2023 4 0 - 5 /HPF Final   04/10/2023 34 (H) 0 - 5 /HPF Final   04/08/2023 89 (H) 0 - 5 /HPF Final   04/07/2023 11 (H) 0 - 5 /HPF Final   03/30/2023 7 (H) 0 - 5 /HPF Final   03/27/2023 16 (H) 0 - 5 /HPF Final   03/25/2023 20 (H) 0 - 5 /HPF Final     Squam Epithel, UA   Date Value Ref Range Status   04/17/2023 4 0 - 5 /HPF Final   04/10/2023 2 0 - 5 /HPF Final   04/08/2023 4 0 - 5 /HPF Final   04/07/2023 6 (H) 0 - 5 /HPF Final   03/30/2023 1 0 - 5 /HPF Final   03/27/2023 3 0 - 5 /HPF Final   03/25/2023 12 (H) 0 - 5 /HPF Final     Bacteria, UA   Date Value Ref Range Status   04/17/2023 Rare (A) None Seen /HPF Final   04/10/2023 Moderate (A) None Seen /HPF Final   04/08/2023 None Seen None Seen /HPF Final   04/07/2023 Rare (A) None Seen /HPF Final   03/30/2023 Rare (A) None Seen /HPF Final   03/27/2023 Moderate (A) None Seen /HPF Final   03/25/2023 None Seen None Seen /HPF Final        Imaging:  CT abdomen pelvis without contrast    Result Date: 04/18/2023  EXAM: CT ABDOMEN PELVIS WO CONTRAST ACCESSION: [id number] CLINICAL INDICATION: 22 years old with R kidney stone      COMPARISON: CT abdomen pelvis 03/30/2023.     TECHNIQUE: A spiral CT scan was obtained without IV contrast from the lung bases to the pubic symphysis.  Images were reconstructed in the axial plane. Coronal and sagittal reformatted images were also provided for further evaluation.     Evaluation of the solid organs and vasculature is limited in the absence of intravenous contrast.  FINDINGS:     LOWER CHEST: Unremarkable.     LIVER: Normal liver contour. Diffuse hepatic steatosis. No focal liver lesion on non-contrast examination.     BILIARY: The gallbladder is normal in appearance. No intrahepatic biliary ductal dilatation.     SPLEEN: Normal in size and contour.     PANCREAS: Normal pancreatic contour without signs of inflammation or gross ductal dilatation.     ADRENAL GLANDS: Normal appearance of the adrenal glands.     KIDNEYS/URETERS: Smooth renal contours.  No nephrolithiasis.  No ureteral dilatation or collecting system distention.     BLADDER: Right nephroureteral stent with proximal portion coiled at the ureteropelvic junction (3:58) and distal portion coiled in the bladder. Mild inflammatory stranding along the course of the right ureter (2:104 for reference). There is a 6 mm nonobstructive right renal stone in the lower pole. Mild right hydronephrosis. No hydronephrosis.     REPRODUCTIVE ORGANS: Unremarkable.     GI TRACT: No findings of bowel obstruction or acute inflammation. Normal appendix. Colonic diverticulosis.     PERITONEUM/RETROPERITONEUM AND MESENTERY: No free air. No ascites. No fluid collection.     VASCULATURE: Normal caliber aorta. Otherwise, limited evaluation without contrast.     LYMPH NODES: No adenopathy.     BONES and SOFT TISSUES: No aggressive osseous lesions. No focal soft tissue lesions.         Migration of the proximal ureteral stent into the ureteropelvic junction with new mild right hydronephrosis.     New nonobstructive 6 cm calculus in the right lower pole kidney.     Nonspecific right periureteral inflammatory stranding, possibly related to indwelling stent migration or suggestive of ascending urinary tract infection; new since 03/30/2023 CT.     Additional chronic/incidental findings as described in the body of the report.

## 2023-04-19 LAB — BASIC METABOLIC PANEL
ANION GAP: 6 mmol/L (ref 5–14)
BLOOD UREA NITROGEN: 14 mg/dL (ref 9–23)
BUN / CREAT RATIO: 20
CALCIUM: 9.2 mg/dL (ref 8.7–10.4)
CHLORIDE: 107 mmol/L (ref 98–107)
CO2: 29.6 mmol/L (ref 20.0–31.0)
CREATININE: 0.7 mg/dL
EGFR CKD-EPI (2021) FEMALE: >90 mL/min/{1.73_m2} (ref >=60)
GLUCOSE RANDOM: 95 mg/dL (ref 70–179)
POTASSIUM: 3.9 mmol/L (ref 3.4–4.8)
SODIUM: 143 mmol/L (ref 135–145)

## 2023-04-19 LAB — CBC
HEMATOCRIT: 40.1 % (ref 34.0–44.0)
HEMOGLOBIN: 13.5 g/dL (ref 11.3–14.9)
MEAN CORPUSCULAR HEMOGLOBIN CONC: 33.5 g/dL (ref 32.3–35.0)
MEAN CORPUSCULAR HEMOGLOBIN: 29.6 pg (ref 25.9–32.4)
MEAN CORPUSCULAR VOLUME: 88.3 fL (ref 77.6–95.7)
MEAN PLATELET VOLUME: 8.8 fL (ref 7.3–10.7)
PLATELET COUNT: 222 10*9/L (ref 170–380)
RED BLOOD CELL COUNT: 4.54 10*12/L (ref 3.95–5.13)
RED CELL DISTRIBUTION WIDTH: 13.5 % (ref 12.2–15.2)
WBC ADJUSTED: 9.4 10*9/L (ref 4.2–10.2)

## 2023-04-19 MED ADMIN — ondansetron (ZOFRAN-ODT) disintegrating tablet 4 mg: 4 mg | ORAL | @ 18:00:00 | NDC 71930001752

## 2023-04-19 MED ADMIN — carboxymethylcellulose sodium (REFRESH CELLUVISC) 1 % ophthalmic gel 1 drop: 1 [drp] | OPHTHALMIC | @ 05:00:00 | NDC 70000008901

## 2023-04-19 MED ADMIN — carboxymethylcellulose sodium (REFRESH CELLUVISC) 1 % ophthalmic gel 1 drop: 1 [drp] | OPHTHALMIC | @ 11:00:00 | NDC 70000008901

## 2023-04-19 MED ADMIN — enoxaparin (LOVENOX) syringe 40 mg: 40 mg | SUBCUTANEOUS | @ 04:00:00 | NDC 81952012424

## 2023-04-19 MED ADMIN — ondansetron (ZOFRAN) injection 4 mg: 4 mg | INTRAVENOUS | Stop: 2023-04-18 | NDC 71930001752

## 2023-04-19 MED ADMIN — enoxaparin (LOVENOX) syringe 40 mg: 40 mg | SUBCUTANEOUS | @ 17:00:00 | NDC 81952012424

## 2023-04-19 MED ADMIN — gabapentin (NEURONTIN) capsule 300 mg: 300 mg | ORAL | @ 18:00:00 | NDC 76420004790

## 2023-04-19 MED ADMIN — oxybutynin (DITROPAN) tablet 5 mg: 5 mg | ORAL | @ 05:00:00 | NDC 64376040216

## 2023-04-19 MED ADMIN — carboxymethylcellulose sodium (REFRESH CELLUVISC) 1 % ophthalmic gel 1 drop: 1 [drp] | OPHTHALMIC | @ 22:00:00 | NDC 70000008901

## 2023-04-19 MED ADMIN — lactated ringers bolus 1,000 mL: 1000 mL | INTRAVENOUS | Stop: 2023-04-18 | NDC 53191040901

## 2023-04-19 MED ADMIN — acetaminophen (TYLENOL) tablet 1,000 mg: 1000 mg | ORAL | @ 04:00:00 | NDC 50580048790

## 2023-04-19 MED ADMIN — acetaminophen (TYLENOL) tablet 1,000 mg: 1000 mg | ORAL | @ 13:00:00 | NDC 53191040901

## 2023-04-19 MED ADMIN — gabapentin (NEURONTIN) capsule 600 mg: 600 mg | ORAL | @ 13:00:00 | Stop: 2023-04-19 | NDC 82804001590

## 2023-04-19 MED ADMIN — lidocaine 4 % patch 1 patch: 1 | TRANSDERMAL | @ 13:00:00 | NDC 54868389400

## 2023-04-19 MED ADMIN — ketorolac (TORADOL) injection 15 mg: 15 mg | INTRAVENOUS | Stop: 2023-04-21 | NDC 00409228831

## 2023-04-19 MED ADMIN — acetaminophen (TYLENOL) tablet 1,000 mg: 1000 mg | ORAL | @ 22:00:00 | NDC 53191040901

## 2023-04-19 MED ADMIN — carboxymethylcellulose sodium (REFRESH CELLUVISC) 1 % ophthalmic gel 1 drop: 1 [drp] | OPHTHALMIC | @ 17:00:00 | NDC 70000008901

## 2023-04-19 MED ADMIN — ketorolac (TORADOL) injection 15 mg: 15 mg | INTRAVENOUS | Stop: 2023-04-18 | NDC 00409228831

## 2023-04-19 MED ADMIN — oxybutynin (DITROPAN) tablet 5 mg: 5 mg | ORAL | @ 13:00:00 | NDC 64376040216

## 2023-04-19 NOTE — Unmapped (Signed)
Pt arrived to the unit around 0000 via [facility] ED staff transport. Upon arrival the pt c/o Rt flank pain, MD notified and ordered PRN Tylenol. Following administration no further c/o pain or discomfort expressed. Personal items noted in flowchart. VS stable and call light in reach.     Problem: Adult Inpatient Plan of Care  Goal: Plan of Care Review  Outcome: Ongoing - Unchanged  Flowsheets (Taken 04/19/2023 0451)  Progress: no change  Plan of Care Reviewed With: patient  Goal: Patient-Specific Goal (Individualized)  Outcome: Ongoing - Unchanged  Flowsheets (Taken 04/19/2023 0451)  Patient/Family-Specific Goals (Include Timeframe): During the shift the pt will report a tolerable pain rating of 0-2 out of 10.  Individualized Care Needs: Fall precautions, Pain management, Ambulation assistance  Anxieties, Fears or Concerns: None  Goal: Absence of Hospital-Acquired Illness or Injury  Outcome: Ongoing - Unchanged  Intervention: Identify and Manage Fall Risk  Recent Flowsheet Documentation  Taken 04/19/2023 0400 by Ali Lowe, RN  Safety Interventions:   fall reduction program maintained   isolation precautions   low bed   nonskid shoes/slippers when out of bed   room near unit station  Taken 04/19/2023 0200 by Ali Lowe, RN  Safety Interventions:   fall reduction program maintained   isolation precautions   low bed   nonskid shoes/slippers when out of bed   room near unit station  Taken 04/19/2023 0000 by Ali Lowe, RN  Safety Interventions:   fall reduction program maintained   family at bedside   low bed   nonskid shoes/slippers when out of bed   room near unit station   isolation precautions  Goal: Optimal Comfort and Wellbeing  Outcome: Ongoing - Unchanged  Goal: Readiness for Transition of Care  Outcome: Ongoing - Unchanged  Intervention: Mutually Develop Transition Plan  Recent Flowsheet Documentation  Taken 04/18/2023 2318 by Ali Lowe, RN  Transportation Anticipated: family or friend will provide  Goal: Rounds/Family Conference  Outcome: Ongoing - Unchanged  Flowsheets (Taken 04/19/2023 0451)  Participants: nursing     Problem: Infection  Goal: Absence of Infection Signs and Symptoms  Outcome: Ongoing - Unchanged  Intervention: Prevent or Manage Infection  Recent Flowsheet Documentation  Taken 04/19/2023 0400 by Ali Lowe, RN  Isolation Precautions: enteric precautions maintained  Taken 04/19/2023 0200 by Ali Lowe, RN  Isolation Precautions: enteric precautions maintained  Taken 04/19/2023 0000 by Ali Lowe, RN  Isolation Precautions: enteric precautions initiated

## 2023-04-19 NOTE — Unmapped (Signed)
[facility]  Emergency Department Provider Note          ED Clinical Impression      Final diagnoses:   Ureteral stent present (Primary)   Flank pain   Nausea and vomiting, unspecified vomiting type            Impression, Medical Decision Making, Progress Notes and Critical Care      Brandi Flynn is a 22 y.o. female with a past medical history of MS and nephrolithiasis with stent placed 6/26, recent admission with nausea vomiting and presumed pyelonephritis in which case cultures came back as mixed urogenital flora, presenting for evaluation of continued flank pain with nausea, vomiting and diarrhea. She was actually seen in the ED yesterday with similar symptoms and discharged home and now returns with ongoing symptoms and inability to tolerate p.o.    Patient is overall nontoxic-appearing, reassuring vital signs.  Abdomen is soft, nondistended, mild tenderness on the right side and right flank.    Reviewed urology note dated yesterday reported that her symptoms are most likely related to irritation from the stent of which the proximal curl of stent has migrated to the right UPJ.  She is scheduled for laser lithotripsy and stent removal on Friday.  They advised that removing the stent sooner would likely cause increased pain from previous obstruction.    Plan to treat symptoms and reassess.  We may need to admit her for symptom control.    ED Course as of 04/18/23 2143   Mon Apr 18, 2023   2043 Creatinine: 0.67   2043 WBC: 10.0   2043 HGB: 14.2  Labs reassuring.   2138 UA shows blood, few WBCs, rare bacterial. Will hold on antibiotics and await culture.   2142 Labs overall reassuring.  Discussed with patient.  Given her ongoing symptoms and inability to tolerate po necessitating multiple ED visits, she is requesting admission and I think this is reasonable.  MAO paged for admission.       Outside Records Reviewed: 04/13/23 [facility] Discharge Summary, Urology consult note 04/18/23 History        Reason for Visit  Flank Pain      HPI   Brandi Flynn is a 22 y.o. female with a past medical history of MS and nephrolithiasis (s/p R uretral stent on 03/26/23) presenting for evaluation of flank pain. Per chart review, the patient was admitted from 04/10/23 to 04/13/23 for pyelonephritis in setting of known R nephrolithiasis after she presented with supapubic pain and flank pain with nausea and emesis. She was given IV ceftriaxone for her pyelonephritis, which was subsequently discontinued when her urine culture resulted negative with urogenital flora. Her pain was managed with IV Toradol and she was discharged with oral Toradol, Tylenol (1000 mg TID), and gabapentin (300 mg TID) as needed. She was additionally recommended to take famotidine for gastric protection and given Zofran for nausea control.     She returned to the ED yesterday for worsening pain with continued nausea and emesis, where a CT A/P showed migration of the proximal ureteral stent into the ureteropelvic junction with new mild right hydronephrosis in addition to a new 6 cm kidney stone. She was discharged with additional pain medications after being evaluated by urology, who recommended her to keep her lithotripsy scheduled on 7/26.     Currently, the patient reports continued pain with nausea and emesis in addition to diarrhea. She additionally endorses dehydration as well as intermittent chills and hot  flashes. She notes that she has been unable to take her medications regularly secondary to her emesis.     Outside Historian(s): None    External Records Reviewed: 04/13/23 [facility] Discharge Summary    Past Medical History:   Diagnosis Date    Disease of thyroid gland     Multiple sclerosis (CMS-HCC)     Obesity     Vertigo        Patient Active Problem List   Diagnosis    Tobacco use disorder    Multiple sclerosis (CMS-HCC)    Demyelinating disease (CMS-HCC)    Blepharitis of both eyes    Dry eye syndrome of both eyes Obesity    Skin infection    Kidney stones    Pyelonephritis    Nausea & vomiting       Past Surgical History:   Procedure Laterality Date    PR CYSTOSCOPY,INSERT URETERAL STENT Right 03/26/2023    Procedure: CYSTOURETHROSCOPY,  WITH INSERTION OF INDWELLING URETERAL STENT (EG, GIBBONS OR DOUBLE-J TYPE);  Surgeon: Annye English, MD;  Location: MAIN OR [facility];  Service: Urology    PR CYSTOURETHROSCOPY,URETER CATHETER Right 03/26/2023    Procedure: CYSTOURETHROSCOPY, W/URETERAL CATHETERIZATION, W/WO IRRIG, INSTILL, OR URETEROPYELOG, EXCLUS OF RADIOLG SVC;  Surgeon: Annye English, MD;  Location: MAIN OR [facility];  Service: Urology         Current Facility-Administered Medications:     ketorolac (TORADOL) injection 15 mg, 15 mg, Intravenous, Q6H PRN, Mauri Pole, MD, 15 mg at 04/18/23 2016    Current Outpatient Medications:     acetaminophen (TYLENOL EXTRA STRENGTH) 500 MG tablet, Take 2 tablets (1,000 mg total) by mouth every eight (8) hours for 9 days., Disp: 54 tablet, Rfl: 0    carboxymethylcellulose sodium (REFRESH CELLUVISC) 1 % DpGe, Administer 1 drop to both eyes four (4) times a day., Disp: 1 each, Rfl: 0    cetirizine (ZYRTEC) 10 MG tablet, Take 1 tablet (10 mg total) by mouth daily as needed for allergies., Disp: 30 tablet, Rfl: 0    cholecalciferol, vitamin D3 25 mcg, 1,000 units,, (CHOLECALCIFEROL-25 MCG, 1,000 UNIT,) 1,000 unit (25 mcg) tablet, Take 1 tablet (25 mcg total) by mouth daily., Disp: , Rfl:     famotidine (PEPCID) 20 MG tablet, Take 1 tablet (20 mg total) by mouth two (2) times a day., Disp: 60 tablet, Rfl: 0    gabapentin (NEURONTIN) 300 MG capsule, Take 2 capsules (600 mg total) by mouth Three (3) times a day., Disp: 180 capsule, Rfl: 0    ketorolac (TORADOL) 10 mg tablet, Take 1 tablet (10 mg total) by mouth every six (6) hours as needed for pain for up to 9 days., Disp: 36 tablet, Rfl: 0    loperamide (IMODIUM A-D) 2 mg tablet, Take 1 tablet (2 mg total) by mouth four (4) times a day as needed for diarrhea., Disp: 30 tablet, Rfl: 0    metoclopramide (REGLAN) 10 MG tablet, Take 1 tablet (10 mg total) by mouth every six (6) hours for 7 days., Disp: 28 tablet, Rfl: 0    nicotine (NICODERM CQ) 21 mg/24 hr patch, Place 1 patch on the skin daily., Disp: 28 patch, Rfl: 0    nicotine polacrilex (NICORETTE) 4 MG gum, Apply 1 each (4 mg total) to cheek every hour as needed for smoking cessation., Disp: 110 each, Rfl: 0    nystatin (MYCOSTATIN) 100,000 unit/gram powder, Apply 1 Application topically two (2) times a day., Disp: 60 g,  Rfl: 1    ondansetron (ZOFRAN-ODT) 8 MG disintegrating tablet, Dissolve 1 tablet (8 mg total) in the mouth every eight (8) hours as needed for nausea., Disp: , Rfl:     oxybutynin (DITROPAN XL) 10 MG 24 hr tablet, Take 1 tablet (10 mg total) by mouth daily., Disp: 30 tablet, Rfl: 0    oxyCODONE (ROXICODONE) 5 MG immediate release tablet, Take 1 tablet (5 mg total) by mouth every four (4) hours as needed for pain for up to 5 days., Disp: 10 tablet, Rfl: 0    Allergies  Blueberry and Peanut    Family History   Problem Relation Age of Onset    Thyroid disease Mother     Thyroid disease Maternal Grandmother     Thyroid disease Maternal Grandfather     Thyroid disease Paternal Grandmother     Vitiligo Paternal Grandmother        Social History  Social History     Tobacco Use    Smoking status: Every Day     Types: e-Cigarettes    Smokeless tobacco: Never    Tobacco comments:     Vapes   Vaping Use    Vaping status: Never Used   Substance Use Topics    Alcohol use: Not Currently    Drug use: Never       Review of Systems  All other systems have been reviewed and are negative except as otherwise documented.       Physical Exam     ED Triage Vitals [04/18/23 1947]   Enc Vitals Group      BP 122/88      Heart Rate 92      SpO2 Pulse 92      Resp 18      Temp 37 ??C (98.6 ??F)      Temp Source Oral      SpO2 98 %      Weight (!) 139.7 kg (308 lb)      Height 1.6 m (5' 3)      Head Circumference       Peak Flow       Pain Score       Pain Loc       Pain Education       Exclude from Growth Chart          Constitutional: Alert and oriented. Well appearing and in no distress.  Eyes: Conjunctivae are normal.  ENT       Head: Normocephalic and atraumatic.       Nose: No congestion.       Mouth/Throat: Mucous membranes are moist.       Neck: No stridor.   Cardiovascular: Normal rate, regular rhythm. Distal pulses are present.  Respiratory: Normal respiratory effort. Breath sounds are normal.  Gastrointestinal: Abdomen soft, nondistended. Mild diffuse R sided tenderness and R CVA tenderness.   Musculoskeletal: Normal range of motion in all extremities.       Right lower leg: No tenderness or edema.       Left lower leg: No tenderness or edema.  Neurologic: Normal speech and language. No gross focal neurologic deficits are appreciated.  Skin: Skin is warm, dry and intact. No rash noted.  Psychiatric: Mood and affect are normal. Speech and behavior are normal.       Radiology     No orders to display       Documentation assistance was provided by Orion Crook, Scribe, on April 18, 2023 at 7:53 PM for Wylene Simmer, MD.     April 18, 2023 7:53 PM. Documentation assistance provided by the scribe. I was present during the time the encounter was recorded. The information recorded by the scribe was done at my direction and has been reviewed and validated by me.          Mauri Pole, MD  04/18/23 [phone]

## 2023-04-19 NOTE — Unmapped (Signed)
Bed: 03  Expected date:   Expected time:   Means of arrival:   Comments:  EMS

## 2023-04-19 NOTE — Unmapped (Addendum)
Brandi Flynn is a 22 y.o. female with a past medical history significant for MS and nephrolithiasis (s/p R ureteral stent placed 03/26/23) who presents with persistent flank pain, dysuria, worsening nausea/vomiting/diarrhea, and inability to tolerate PO intake.     # Renal Colic 2/2 R nephrolithiasis s/p ureteral stent   Patient has had six hospitalizations for flank pain following R ureteral stent placement on 6/29. She has also been experiencing nausea, vomiting, and new-onset diarrhea over the last few days. CT scan on 7/22 showed R ureteral calculus and R ureteral stent, with new mild R hydronephrosis when compared to prior. Urology consulted on 7/22 and recommended against stent removal prior to scheduled lithotripsy and removal on 7/26. Urinalysis on 7/22 similar to prior with culture still pending. She remains afebrile with normal serum WBC but endorses dysuria. Pain is likely 2/2 stent discomfort and nephrolithiasis, less likely pyelonephritis/UTI. Pain management is a primary goal for the patient, and she would like to avoid opioids given family history of OUD.     # Nausea/Vomiting - Diarrhea  # Inability to tolerate PO  Patient has been experiencing nausea, vomiting over past few weeks, now also having diarrhea in the past few days. She has had decreased PO intake lately due to inability to tolerate PO. S/p 1L IVF in ED. EKG on admission showed NSR, QTc 453 ms. Cdiff and GIPP negative. Tolerated PO intake during hospitalization with intermittent episodes of emesis usually secondary to uncontrolled pain.      # Multiple Sclerosis  Reports worsening blurriness and leg pain, similar symptoms that she felt before her first flare (and subsequent diagnosis of MS) in April 2024. Continue to monitor and advise outpatient follow-up after surgery. Encourage ambulation as tolerated.     # Headache  Has been taking high doses of tylenol for the past 2.5 weeks and describes waking up with headaches that are unremitting until she is about to go to bed. She noted some photophobia as well. Likely due to recent poor PO intake and increased analgesic use. Encouraged increased hydration and hopeful weaning off of tylenol following 7/26 procedure.

## 2023-04-19 NOTE — Unmapped (Signed)
Family Medicine Inpatient Service  Progress Note    Team: Family Medicine [place] [facility] (pgr [phone])  PCP: [name], FNP  Date of Admission: April 18, 2023  Code Status: full code  Emergency Contact: Dellie Burns [phone])    Hospital Day: 0    ASSESSMENT / PLAN:   Brandi Flynn is a 22 y.o. female with a past medical history significant for MS and nephrolithiasis (s/p R ureteral stent placed 03/26/23) who presents with persistent flank pain, dysuria, and worsening nausea/vomiting/diarrhea and inability to tolerate PO.    # Renal Colic 2/2 R nephrolithiasis s/p ureteral stent   Patient has had six hospitalizations for flank pain following R ureteral stent placement on 6/29. She has also been experiencing nausea, vomiting, and new-onset diarrhea over the last few days. CT scan on 7/22 showed R ureteral calculus and R ureteral stent. The calculus has migrated to the right lower pole of the kidney, and the stent has migrated into the ureteropelvic junction, with new mild R hydronephrosis when compared to prior. Urology was consulted on 7/22 and recommended against stent removal prior to scheduled lithotripsy and removal on 7/26 given the risk of exacerbating her stone pain and increasing potential for UTI. Urinalysis on 7/22 showed increased RBC, leukocyte esterase, and WBCs with rare bacteria and rare calcium oxalate crystals. Urine culture is still pending as of 7/23; however, prior urinalyses have shown similar findings with normal flora. She remains afebrile with normal serum WBC but endorses dysuria; however, pain is likely 2/2 stent discomfort and nephrolithiasis, less likely pyelonephritis/UTI. Pain management is a primary goal for the patient, and she would like to avoid opioids given family history of OUD. Did report feeling sleepy after increased gabapentin dose 7/23 AM.   - Pain regimen:   - Scheduled PO Tylenol 1g q8hr, Gabapentin 300 mg AM/afternoon + 600 mg nightly  - As needed IV toradol 15 mg q6hr, lidocaine patch  - Urology consulted in ED, will follow-up with today     # Nausea/Vomiting - Diarrhea  # Inability to tolerate PO  Patient has been experiencing nausea, vomiting over past few weeks, now also having diarrhea in the past few days. She has had decreased PO intake lately due to inability to tolerate PO. S/p 1L IVF in ED. EKG on admission showed NSR, QTc 453 ms. Given recent hospitalizations and antibiotic use, want to rule out C. Diff as potential cause of her diarrhea vs. Other causes of viral gastroenteritis. She has been tolerating liquids better this morning 7/23 following IV Zofran. Ate breakfast but had return of nausea afterwards, unable to get IV zofran due to her IV malfunction, but open to trying ODT. Encouraged increased water intake throughout the day and meals as tolerable.  - Zofran 4mg  ODT PRN q8h, if unable to tolerate PO has Zofran 4mg  IV q6hr PRN  - Follow-up GI pathogen panel and C Diff assay  - Advance diet as tolerated  - Encourage PO hydration, if unable to tolerate then restart IV rehydration therapy    # Multiple Sclerosis  Reports worsening blurriness and leg pain, similar symptoms that she felt before her first flare (and subsequent diagnosis of MS) in April 2024. Continue to monitor and advise outpatient follow-up after surgery. Encourage ambulation as tolerated.  - Oxybutynin 10mg  PO daily  - Refresh celluvisc 1% 1 drop to each eye QID    # Headache  Has been taking high doses of tylenol for the past 2.5 weeks and describes waking up  with headaches that are unremitting until she is about to go to bed. She noted some photophobia as well. Likely due to recent poor PO intake and increased analgesic use. Encouraged increased hydration and hopeful weaning off of tylenol following 7/26 procedure.   - Will continue to monitor    # Checklist:  - IVF: Bolus NS as needed for hydration  - Daily labs needed: BMP  - Diet Regular  - Bowel Regimen: No indication for a bowel regimen at this time  - DVT: SQ Lovenox  - Code Status:   Orders Placed This Encounter   Procedures    Full Code     Standing Status:   Standing     Number of Occurrences:   1     - Dispo: Floor    [ ]  Anticipated Discharge Location: Home  [ ]  PT/OT/DME: No needs anticipated  [ ]  CM/SW needs: None anticipated  [ ]  Follow up appt: Has follow up appointment with Urology for lithotripsy and stent removal on 7/26    SUBJECTIVE:  Interval events:   Admitted overnight. Did not sleep well overnight given persistent flank pain. She had no episodes of diarrhea or vomiting overnight. She endorsed a headache upon waking, has had more often recently in the setting of her pain and poor PO intake. She believes gabapentin has helped the most with controlling her pain, so nursing was asked to administer her PRN gabapentin. We also discussed which medications she will need to request vs. Scheduled moving forward.  Her pain is located in her R flank/abdomen, rates currently as 10/10 in severity, stabbing in nature. Feels it is worse since her stent migrated. Notes the nausea and vomiting is usually triggered by taking PO pills. Feels hot/cold but no measured fevers at home.     REVIEW OF SYSTEMS:  Pertinent positives and negatives per HPI. A complete review of systems otherwise negative.    PHYSICAL EXAM:  Temp:  [36.6 ??C (97.9 ??F)-37 ??C (98.6 ??F)] 36.6 ??C (97.9 ??F)  Heart Rate:  [79-92] 79  SpO2 Pulse:  [92] 92  Resp:  [18] 18  BP: (122-145)/(67-99) 137/67  SpO2:  [95 %-99 %] 99 %      Intake/Output Summary (Last 24 hours) at 04/19/2023 1144  Last data filed at 04/19/2023 1015  Gross per 24 hour   Intake 1000 ml   Output 100 ml   Net 900 ml     GEN: sitting up in bed, NAD, described continued pain in her R flank  HEENT: NCAT, No scleral icterus. Conjunctiva non-erythematous. MMM.  CV: Regular rate and rhythm. No murmurs/rubs/gallops.  Pulm: Normal work of breathing. CTAB. No wheezing, crackles, or rhonchi.  Abd: TTP in right upper and lower quadrants. No guarding, rebound. Normoactive bowel sounds. R >>> L CVA tenderness.   Neuro: A&O x 3. No focal deficits.  Ext: No peripheral edema.  Palpable distal pulses.  Skin: No rashes or skin lesions.     LABS/ STUDIES:    All imaging, laboratory studies, and other pertinent tests including electrocardiography within the last 24 hours were reviewed and are summarized within the assessment and plan.     Lilla Shook, MS3 04/19/2023     I attest that I have reviewed the student note and that the components of the history of the present illness, the physical exam, and the assessment and plan documented were performed by me or were performed in my presence by the student where I verified the  documentation and performed (or re-performed) the exam and medical decision making.     Cherie Dark, MD  Resident Physician Family Medicine, PGY-2

## 2023-04-19 NOTE — Unmapped (Signed)
Pt is A/Ox4. RA. Pt is able to express needs, pt c/o nausea and PRN zofran given with pt reported relief. PIV dressing changed this shift, patent and SL. Stool sample sent to lab, results pending. Pt had a shower. Family remains at bedside. Pt resting in bed and reports needs met at this time. Call bell and bedside table within reach. Bed locked and low.    Problem: Adult Inpatient Plan of Care  Goal: Plan of Care Review  Outcome: Progressing  Flowsheets (Taken 04/19/2023 1329)  Progress: improving  Plan of Care Reviewed With: patient  Goal: Patient-Specific Goal (Individualized)  Outcome: Progressing  Flowsheets (Taken 04/19/2023 1329)  Patient/Family-Specific Goals (Include Timeframe): Pt will report adequate nausea mangement this shift 7a  Individualized Care Needs: Monitor, falls/safety precautions, monitor I/O's, stool sample, enteric isolation, blood glucose monitoring  Anxieties, Fears or Concerns: none expressed  Goal: Absence of Hospital-Acquired Illness or Injury  Outcome: Progressing  Intervention: Identify and Manage Fall Risk  Recent Flowsheet Documentation  Taken 04/19/2023 1200 by Jerelene Redden, RN  Safety Interventions:   environmental modification   fall reduction program maintained   lighting adjusted for tasks/safety   low bed  Taken 04/19/2023 1000 by Jerelene Redden, RN  Safety Interventions:   fall reduction program maintained   environmental modification   family at bedside   lighting adjusted for tasks/safety   low bed  Taken 04/19/2023 0800 by Jerelene Redden, RN  Safety Interventions:   environmental modification   fall reduction program maintained   lighting adjusted for tasks/safety   low bed  Taken 04/19/2023 0734 by Jerelene Redden, RN  Safety Interventions:   environmental modification   fall reduction program maintained   family at bedside   lighting adjusted for tasks/safety   low bed  Intervention: Prevent and Manage VTE (Venous Thromboembolism) Risk  Recent Flowsheet Documentation  Taken 04/19/2023 1200 by Jerelene Redden, RN  Anti-Embolism Intervention: (lovenox) Other (Comment)  Taken 04/19/2023 1000 by Jerelene Redden, RN  Anti-Embolism Intervention: (lovenox) Other (Comment)  Taken 04/19/2023 0900 by Jerelene Redden, RN  VTE Prevention/Management: anticoagulant therapy  Anti-Embolism Intervention: (lovenox) Other (Comment)  Taken 04/19/2023 0800 by Jerelene Redden, RN  Anti-Embolism Intervention: (lovenox) Other (Comment)  Goal: Optimal Comfort and Wellbeing  Outcome: Progressing  Goal: Readiness for Transition of Care  Outcome: Progressing  Goal: Rounds/Family Conference  Outcome: Progressing     Problem: Infection  Goal: Absence of Infection Signs and Symptoms  Outcome: Progressing  Intervention: Prevent or Manage Infection  Recent Flowsheet Documentation  Taken 04/19/2023 1200 by Jerelene Redden, RN  Isolation Precautions: enteric precautions maintained  Taken 04/19/2023 1000 by Jerelene Redden, RN  Isolation Precautions: enteric precautions maintained  Taken 04/19/2023 0800 by Jerelene Redden, RN  Isolation Precautions: enteric precautions maintained  Taken 04/19/2023 0734 by Jerelene Redden, RN  Isolation Precautions: enteric precautions maintained

## 2023-04-19 NOTE — Unmapped (Signed)
Patient BIBOCEMS for right sided flank pain. Reports nausea with vomiting. Hx kidney stones.

## 2023-04-19 NOTE — Unmapped (Signed)
Family Medicine Inpatient Service  History and Physical Note    Team: [facility] (pgr [phone])  PCP: [name], FNP  Date of Admission: April 18, 2023  Code Status: full code  Emergency Contact: Dellie Burns [phone])    ASSESSMENT / PLAN:   Brandi Flynn is a 22 y.o. female with a past medical history significant for MS and nephrolithiasis (s/p R ureteral stent placed 03/26/23) who presents with persistent flank pain and worsening nausea/vomiting/diarrhea.    # Nausea/Vomiting - Diarrhea  Profuse nausea, vomiting, and diarrhea most concerning for acute viral gastroenteritis. Given recent antibiotic use during prior hospitalizations, concern for C. Diff. Prescribed antiemetics during ED visit yesterday, but unable to tolerate PO. Would benefit from IV Zofran and fluids until she can tolerate PO and oral rehydration. N/V may also be 2/2 pain of kidney stone and dislodged ureteral stent, but infectious process might be superimposed, causing the acute worsening over the last few days.  - Zofran 4mg  IV q6hr PRN  - GI pathogen panel  - C Diff assay  - EKG for QTc measurement   - Advance diet as tolerated  - Continue IV hydration. 1 L LR in ED. Consider another 1 L LR if poor oral hydration. Encourage PO hydration.    # R nephrolithiasis s/p ureteral stent  6x18mm ureteral calculus identified on CT scan 6/26 and R ureteral stent placed 6/29. Pt reports persistent pain, nausea, and vomiting since stent placement. CT on 7/22 revealed migration of stent into the ureteropelvic junction with new mild right hydronephrosis. Also showed migration of 6mm calculus into right lower pole. Pt's pain likely 2/2 stent discomfort. Urology consulted on 7/22 and recommended against removing the stent ahead of her scheduled lithotripsy on 7/26 at the risk of causing more stone pain. Recommended increasing pain regimen until surgery on Friday. UA in ED revealed large LEs and significant blood and WBCs in urine, but consistent with prior UAs over the last month. UCx sent, but low suspicion for UTI given lack of fever and negative cultures during admission last week.  - Tylenol 1000mg  PO q8hr  - Toradol 15mg  IV q6hr PRN  - Lidocaine patch  - Gabapentin 600mg  TID  - consider adding oxycodone 5mg  PO PRN if patient desires (currently avoiding d/t extensive family history of OUD)    #Multiple Sclerosis  Reports worsening blurriness and leg pain, similar symptoms that she felt before her first flare (and subsequent diagnosis of MS) in April 2024. Continue to monitor and advise outpatient follow-up after surgery. Encourage ambulation as tolerated.  - Oxybutynin 10mg  PO daily  - Refresh celluvisc 1% 1 drop to each eye QID    # FEN/GI:  - IVF: received 1000 mL NS in ED, continue bolus IV hydration as needed  - Check electrolytes as indicated, replete as needed.  - Diet Regular (as tolerated)    # PPX:   - DVT: SQ Lovenox    # Dispo: Floor  [ ]  Anticipated Discharge Location: Home  [ ]  PT/OT/DME: No needs anticipated  [ ]  CM/SW needs: None anticipated  [ ]  Teaching: None anticipated    HISTORY OF PRESENT ILLNESS:  Brandi Flynn is a 22 y.o. female who presents with nausea and vomiting. Reports she hasn't been able to keep any PO down for 3 days and hasn't been able to take medications. Was seen in [facility] ED yesterday for pain/nausea/vomiting and they discharged her with pain medication and antiemetics, but she wasn't able to take them  today d/t her N/V. Also reports having loose BM for the last 3 days with most recent BMs appearing completely watery without flecks. Denies blood in stool. States her last solid BM was last week. Denies sick contacts. Endorses abdominal pain, especially on the right side. States that the IV Zofran she has received in the ED so far has helped the nausea, she now feels hungry.    Pt takes oxybutynin for bladder spasms, but reports she hasn't been able to tolerate the med for the last few days. She's been able to urinate, but it has been painful. Endorses a burning sensation, says it feels like a worse UTI. Resolved briefly with antibiotics during prior admission, but states the symptoms have returned).    Pt says she has experienced increasing L eye blurriness and bilateral leg pain. Reports that last time she felt this, she went into an MS flare and was unable to walk. Reports using a rollator at home but doesn't ambulate much.    On ROS, endorses dizziness, headache, waxing and waning tinnitus in R ear, no sense of taste. Denies chest pain, SOB.    ED Course:     Patient received 1 L LR, Zofran 4 mg, Acetaminophen 1000mg , toradol 15 mg    PAST MEDICAL / SURGICAL HX:  Past Medical History:   Diagnosis Date    Disease of thyroid gland     Multiple sclerosis (CMS-HCC)     Obesity     Vertigo      Past Surgical History:   Procedure Laterality Date    PR CYSTOSCOPY,INSERT URETERAL STENT Right 03/26/2023    Procedure: CYSTOURETHROSCOPY,  WITH INSERTION OF INDWELLING URETERAL STENT (EG, GIBBONS OR DOUBLE-J TYPE);  Surgeon: Annye English, MD;  Location: MAIN OR [facility];  Service: Urology    PR CYSTOURETHROSCOPY,URETER CATHETER Right 03/26/2023    Procedure: CYSTOURETHROSCOPY, W/URETERAL CATHETERIZATION, W/WO IRRIG, INSTILL, OR URETEROPYELOG, EXCLUS OF RADIOLG SVC;  Surgeon: Annye English, MD;  Location: MAIN OR [facility];  Service: Urology       FAMILY HX:  Family History   Problem Relation Age of Onset    Thyroid disease Mother     Thyroid disease Maternal Grandmother     Thyroid disease Maternal Grandfather     Thyroid disease Paternal Grandmother     Vitiligo Paternal Grandmother        SOCIAL HX:  Social History     Socioeconomic History    Marital status: Single     Spouse name: None    Number of children: None    Years of education: None    Highest education level: None   Tobacco Use    Smoking status: Every Day     Types: e-Cigarettes    Smokeless tobacco: Never    Tobacco comments:     Vapes   Vaping Use Vaping status: Former    Substances: Nicotine   Substance and Sexual Activity    Alcohol use: Not Currently    Drug use: Never    Sexual activity: Yes   Social History Narrative    ** Merged History Encounter **          Social Determinants of Health     Financial Resource Strain: Low Risk  (01/06/2023)    Overall Financial Resource Strain (CARDIA)     Difficulty of Paying Living Expenses: Not hard at all   Food Insecurity: No Food Insecurity (01/06/2023)    Hunger Vital Sign     Worried About  Running Out of Food in the Last Year: Never true     Ran Out of Food in the Last Year: Never true   Transportation Needs: No Transportation Needs (01/14/2023)    PRAPARE - Therapist, art (Medical): No     Lack of Transportation (Non-Medical): No       MEDICATIONS / ALLERGIES:  Medications Prior to Admission   Medication Sig Dispense Refill Last Dose    acetaminophen (TYLENOL EXTRA STRENGTH) 500 MG tablet Take 2 tablets (1,000 mg total) by mouth every eight (8) hours for 9 days. 54 tablet 0 04/17/2023    carboxymethylcellulose sodium (REFRESH CELLUVISC) 1 % DpGe Administer 1 drop to both eyes four (4) times a day. 1 each 0 Past Week    cholecalciferol, vitamin D3 25 mcg, 1,000 units,, (CHOLECALCIFEROL-25 MCG, 1,000 UNIT,) 1,000 unit (25 mcg) tablet Take 1 tablet (25 mcg total) by mouth daily.   04/17/2023    famotidine (PEPCID) 20 MG tablet Take 1 tablet (20 mg total) by mouth two (2) times a day. 60 tablet 0 Unknown    gabapentin (NEURONTIN) 300 MG capsule Take 2 capsules (600 mg total) by mouth Three (3) times a day. 180 capsule 0 04/17/2023    ketorolac (TORADOL) 10 mg tablet Take 1 tablet (10 mg total) by mouth every six (6) hours as needed for pain for up to 9 days. 36 tablet 0 04/17/2023    loperamide (IMODIUM A-D) 2 mg tablet Take 1 tablet (2 mg total) by mouth four (4) times a day as needed for diarrhea. 30 tablet 0 Past Month    metoclopramide (REGLAN) 10 MG tablet Take 1 tablet (10 mg total) by mouth every six (6) hours for 7 days. 28 tablet 0 Unknown    nystatin (MYCOSTATIN) 100,000 unit/gram powder Apply 1 Application topically two (2) times a day. 60 g 1 Past Month    ondansetron (ZOFRAN-ODT) 8 MG disintegrating tablet Dissolve 1 tablet (8 mg total) in the mouth every eight (8) hours as needed for nausea.   04/18/2023    oxybutynin (DITROPAN XL) 10 MG 24 hr tablet Take 1 tablet (10 mg total) by mouth daily. 30 tablet 0 04/17/2023    oxyCODONE (ROXICODONE) 5 MG immediate release tablet Take 1 tablet (5 mg total) by mouth every four (4) hours as needed for pain for up to 5 days. 10 tablet 0 Unknown    cetirizine (ZYRTEC) 10 MG tablet Take 1 tablet (10 mg total) by mouth daily as needed for allergies. 30 tablet 0        Allergies   Allergen Reactions    Blueberry Hives    Peanut Swelling     Swelling tongue       IMMUNIZATIONS:  Immunization History   Administered Date(s) Administered    COVID-19 VAC,MRNA,TRIS(12Y UP)(PFIZER)(GRAY CAP) 01/24/2020, 02/14/2020    DTaP 12/06/2002, 02/01/2003, 04/16/2003, 04/06/2004, 10/25/2006    Haemophilis Influenza Type B Vaccine Hboc 12/06/2002, 02/01/2003, 04/16/2003, 11/14/2003    Hepatitis A Vaccine Pediatric / Adolescent 2 Dose IM 11/10/2005, 10/25/2006    Hepatitis B vaccine, pediatric/adolescent dosage, 06-23-2003, 11/12/2002, 07/15/2003    Human Pappilomavirus Vaccine,9-Valent(PF) 04/03/2014, 09/04/2014, 04/11/2015    Influenza Vaccine Quad(PF)(Afluria)49mo-Adult 08/30/2011, 08/31/2012, 09/04/2014    MMR 11/14/2003, 10/25/2006    Meningococcal Conjugate MCV4P 04/03/2014    Pneumococcal conjugate -PCV7 12/06/2002, 02/01/2003, 04/16/2003, 04/06/2004    Poliovirus,inactivated (IPV) 12/06/2002, 02/01/2003, 07/15/2003, 10/25/2006    TdaP 04/03/2014    Varicella 11/14/2003, 10/25/2006  REVIEW OF SYSTEMS:  Pertinent positives and negatives per HPI. A complete review of systems otherwise negative.    PHYSICAL EXAM:    Initial ED Vitals:   ED Triage Vitals [04/18/23 1947]   Enc Vitals Group      BP 122/88      Heart Rate 92      SpO2 Pulse 92      Resp 18      Temp 37 ??C (98.6 ??F)      Temp Source Oral      SpO2 98 %      Weight (!) 139.7 kg (308 lb)      Height 1.6 m (5' 3)      Head Circumference       Peak Flow       Pain Score       Pain Loc       Pain Education       Exclude from Growth Chart        Recent Vitals:  Vitals:    04/18/23 2313   BP: 145/99   Pulse: 89   Resp: 18   Temp: 36.8 ??C (98.2 ??F)   SpO2: 95%       GEN: Well-appearing, lying in bed, NAD  Eyes: PERRL. No scleral icterus. Conjunctiva non-erythematous. Strabismus affecting L eye.  HEENT: NCAT, dry mucous membranes. Oropharynx clear.  Neck: Supple.  Lymphadenopathy: No cervical or supraclavicular LAD.  CV: Regular rate and rhythm. No murmurs/rubs/gallops. No costochondral tenderness. No cyanosis or clubbing. Cap Refill < 2 secs  Pulm: CTAB. No wheezing, crackles, or rhonchi but limited by body habitus  Abd: Mildly distended. Tender in RUQ, LUQ, and RLQ. No guarding, rebound.  Normoactive bowel sounds.    Neuro: A&O x 3. No focal deficits. Strength 5/5 UE/LE. Distal sensation to light touch intact.  Ext: L leg more swollen than R. Palpable distal pulses.  Skin: No rashes or skin lesions.      LABS/ STUDIES:  All imaging, laboratory studies, and other pertinent tests including electrocardiography were reviewed prior to admission and are summarized within the assessment and plan.       Archie Balboa  MS3, [employer]    I attest that I have reviewed the student note and that the components of the history of the present illness, the physical exam, and the assessment and plan documented were performed by me or were performed in my presence by the student where I verified the documentation and performed (or re-performed) the exam and medical decision making.     Clovis Cao II, DO  [facility], PGY-1

## 2023-04-20 LAB — BASIC METABOLIC PANEL
ANION GAP: 7 mmol/L (ref 5–14)
BLOOD UREA NITROGEN: 18 mg/dL (ref 9–23)
BUN / CREAT RATIO: 23
CALCIUM: 9.5 mg/dL (ref 8.7–10.4)
CHLORIDE: 107 mmol/L (ref 98–107)
CO2: 28.3 mmol/L (ref 20.0–31.0)
CREATININE: 0.8 mg/dL
EGFR CKD-EPI (2021) FEMALE: >90 mL/min/{1.73_m2} (ref >=60)
GLUCOSE RANDOM: 114 mg/dL (ref 70–179)
POTASSIUM: 4.1 mmol/L (ref 3.4–4.8)
SODIUM: 142 mmol/L (ref 135–145)

## 2023-04-20 MED ADMIN — cholecalciferol (vitamin D3 25 mcg (1,000 units)) tablet 25 mcg: 25 ug | ORAL | @ 01:00:00 | Stop: 2023-04-24 | NDC 53191040901

## 2023-04-20 MED ADMIN — acetaminophen (TYLENOL) tablet 1,000 mg: 1000 mg | ORAL | @ 20:00:00 | NDC 53191040901

## 2023-04-20 MED ADMIN — gabapentin (NEURONTIN) capsule 300 mg: 300 mg | ORAL | @ 18:00:00 | NDC 76420004790

## 2023-04-20 MED ADMIN — carboxymethylcellulose sodium (REFRESH CELLUVISC) 1 % ophthalmic gel 1 drop: 1 [drp] | OPHTHALMIC | @ 20:00:00 | NDC 70000008901

## 2023-04-20 MED ADMIN — oxybutynin (DITROPAN) tablet 5 mg: 5 mg | ORAL | @ 18:00:00 | NDC 64376040216

## 2023-04-20 MED ADMIN — gabapentin (NEURONTIN) capsule 300 mg: 300 mg | ORAL | @ 12:00:00 | Stop: 2023-04-20 | NDC 76420004790

## 2023-04-20 MED ADMIN — carboxymethylcellulose sodium (REFRESH CELLUVISC) 1 % ophthalmic gel 1 drop: 1 [drp] | OPHTHALMIC | @ 18:00:00 | NDC 70000008901

## 2023-04-20 MED ADMIN — ketorolac (TORADOL) injection 15 mg: 15 mg | INTRAVENOUS | @ 22:00:00 | Stop: 2023-04-21 | NDC 00409228831

## 2023-04-20 MED ADMIN — gabapentin (NEURONTIN) capsule 600 mg: 600 mg | ORAL | Stop: 2023-04-19 | NDC 82804001590

## 2023-04-20 MED ADMIN — ondansetron (ZOFRAN-ODT) disintegrating tablet 4 mg: 4 mg | ORAL | @ 12:00:00 | Stop: 2023-04-20 | NDC 71930001752

## 2023-04-20 MED ADMIN — ketorolac (TORADOL) injection 15 mg: 15 mg | INTRAVENOUS | @ 10:00:00 | Stop: 2023-04-21 | NDC 00409228831

## 2023-04-20 MED ADMIN — ondansetron (ZOFRAN-ODT) disintegrating tablet 4 mg: 4 mg | ORAL | @ 20:00:00 | NDC 71930001752

## 2023-04-20 MED ADMIN — oxybutynin (DITROPAN) tablet 5 mg: 5 mg | ORAL | @ 12:00:00 | Stop: 2023-04-20 | NDC 64376040216

## 2023-04-20 MED ADMIN — ondansetron (ZOFRAN-ODT) disintegrating tablet 4 mg: 4 mg | ORAL | @ 02:00:00 | NDC 71930001752

## 2023-04-20 MED ADMIN — acetaminophen (TYLENOL) tablet 1,000 mg: 1000 mg | ORAL | @ 12:00:00 | NDC 53191040901

## 2023-04-20 MED ADMIN — carboxymethylcellulose sodium (REFRESH CELLUVISC) 1 % ophthalmic gel 1 drop: 1 [drp] | OPHTHALMIC | @ 09:00:00 | NDC 70000008901

## 2023-04-20 MED ADMIN — gabapentin (NEURONTIN) capsule 300 mg: 300 mg | ORAL | @ 15:00:00 | NDC 76420004790

## 2023-04-20 MED ADMIN — oxybutynin (DITROPAN) tablet 5 mg: 5 mg | ORAL | NDC 64376040216

## 2023-04-20 MED ADMIN — carboxymethylcellulose sodium (REFRESH CELLUVISC) 1 % ophthalmic gel 1 drop: 1 [drp] | OPHTHALMIC | NDC 70000008901

## 2023-04-20 NOTE — Unmapped (Cosign Needed)
New Urology Consult Note      Requesting Attending Physician:  Bernette Redbird, MD  Service Requesting Consult:  Family Medicine [facility])  Service Providing Consult: [facility]  Consulting Attending: Dr. Guy Sandifer    Assessment:    Patient is a 22 y.o. female with history of hypothyroidism, MS, obesity and nephrolithiasis who is s/p R ureteral stent placement for a 6 mm proximal ureteral stone on 03/26/2023. She is scheduled for USE this Friday, 7/26 at [facility]. She presented to the [facility] ER yesterday for complaints of worsening pain, ongoing n/v and inability to tolerate po intake.     n the ER she was afebrile and HDS. Vital signs have remained stable. Labs were wnl. UA appeared consistent with having an indwelling ureteral stent. Urine culture is pending. CT abd/pelvis was done which showed migration of proximal end of stent into proximal ureter and 6 mm stone in renal pelvis with mild hydronephrosis.     Discussed with patient that her CT scan shows mild migration of ureteral stent and new mild hydronephrosis. This can cause some increased discomfort. However, the hydronephrosis is most likely due to reflux of urine with stent in place and not signs of obstruction.     Discussed with patient that we do not recommend removing her ureteral stent prior to surgery due to possibility of introduction of bacteria into her urinary tract resulting in an infection which could delay her surgery as well as the possibility of having severe, recurrent pain from the stone. Also discussed that unfortunately that due to surgeon availability we are unable to perform her surgery prior to Friday. We will need to attempt to control her symptoms which may mean remaining admitted until surgery.     Update 04/20/23:  Pain controlled overnight, still struggling with intermittent nausea - appears better controlled if able to get medications on time. Remains HDS, voiding spontaneously    Recommendations:  1. Multi-modal pain control for stent discomfort. Currently receiving scheduled Tylenol, lidocaine patch, toradol scheduled, ditropan bid and gabapentin. Recommend increasing ditropan to 5 mg tid along with adding Flomax daily and pyridium tid. May also try applying heating pad to R flank.   2. Will continue with RIGHT USE as scheduled. If she remains admitted she will need to be transferred to main campus prior to surgery on Friday. She will be need to be made NPO after MN Thursday night.   3. Follow-up urine culture results. Not currently receiving antibiotics.   4. Remainder of treatment plan per primary team.     Discussed with Dr. Jennette Dubin. Dr. Guy Sandifer was available.     Thank you for this consult. Please contact the [facility] urology consult pager with any further questions/concerns.    History of Present Illness:     Reason for Consult:  n/v and worsening pain w/ ureteral stent     Brandi Flynn is seen in consultation for reasons noted above at the request of Bernette Redbird, MD on the Family Medicine [facility]) service.    This is a 22 yo patient with a history of hypothyroidism, MS, obesity and nephrolithiasis who is s/p R ureteral stent placement for a 6 mm proximal ureteral stone on 03/26/2023. She is scheduled for USE this Friday, 7/26 at [facility]. She presented to the [facility] ER yesterday for complaints of worsening pain, ongoing n/v and inability to tolerate po intake. This was her 6th ER visit since stent placement due to discomfort. Has been taking ditropan but  now not able to tolerate po.     In the ER she was afebrile and HDS. Vital signs have remained stable. Labs were wnl. UA appeared consistent with having an indwelling ureteral stent. Urine culture is pending. CT abd/pelvis was done which showed migration of proximal end of stent into proximal ureter and 6 mm stone in renal pelvis with mild hydronephrosis.     Today she denies any fever, chills or gross hematuria.       Past Medical History:  Past Medical History:   Diagnosis Date    Disease of thyroid gland     Multiple sclerosis (CMS-HCC)     Obesity     Vertigo        Past Surgical History:   Past Surgical History:   Procedure Laterality Date    PR CYSTOSCOPY,INSERT URETERAL STENT Right 03/26/2023    Procedure: CYSTOURETHROSCOPY,  WITH INSERTION OF INDWELLING URETERAL STENT (EG, GIBBONS OR DOUBLE-J TYPE);  Surgeon: Annye English, MD;  Location: MAIN OR [facility];  Service: Urology    PR CYSTOURETHROSCOPY,URETER CATHETER Right 03/26/2023    Procedure: CYSTOURETHROSCOPY, W/URETERAL CATHETERIZATION, W/WO IRRIG, INSTILL, OR URETEROPYELOG, EXCLUS OF RADIOLG SVC;  Surgeon: Annye English, MD;  Location: MAIN OR [facility];  Service: Urology       Medication:  Current Facility-Administered Medications   Medication Dose Route Frequency Provider Last Rate Last Admin    acetaminophen (TYLENOL) tablet 1,000 mg  1,000 mg [facility] Bernette Redbird, MD   1,000 mg at 04/19/23 1732    carboxymethylcellulose sodium (REFRESH CELLUVISC) 1 % ophthalmic gel 1 drop  1 drop Both Eyes QID Bernette Redbird, MD   1 drop at 04/20/23 0981    cholecalciferol (vitamin D3 25 mcg (1,000 units)) tablet 25 mcg  25 mcg Oral Nightly Wilber Oliphant, MD   25 mcg at 04/19/23 2048    enoxaparin (LOVENOX) syringe 40 mg  40 mg Subcutaneous Q12H Repko, Alexander J, DO   40 mg at 04/19/23 1240    gabapentin (NEURONTIN) capsule 300 mg  300 mg Oral BID Syptak, Caitlyn M, MD   300 mg at 04/19/23 1409    ketorolac (TORADOL) injection 15 mg  15 mg Intravenous Q6H PRN Kaiser, Dallas D, DO   15 mg at 04/20/23 0540    lidocaine 4 % patch 1 patch  1 patch Transdermal Daily Bernette Redbird, MD   1 patch at 04/19/23 0847    ondansetron (ZOFRAN) injection 4 mg  4 mg Intravenous Q6H PRN Syptak, Caitlyn M, MD        ondansetron (ZOFRAN-ODT) disintegrating tablet 4 mg  4 mg Oral Q8H PRN Syptak, Caitlyn M, MD   4 mg at 04/19/23 2229    oxybutynin (DITROPAN) tablet 5 mg  5 mg Oral BID Kaiser, Dallas D, DO   5 mg at 04/19/23 2012 Allergies:  Allergies   Allergen Reactions    Blueberry Hives    Peanut Swelling     Swelling tongue       Social History:  Social History     Tobacco Use    Smoking status: Every Day     Types: e-Cigarettes    Smokeless tobacco: Never    Tobacco comments:     Vapes   Vaping Use    Vaping status: Former    Substances: Nicotine   Substance Use Topics    Alcohol use: Not Currently    Drug use: Never  Family History  Family History   Problem Relation Age of Onset    Thyroid disease Mother     Thyroid disease Maternal Grandmother     Thyroid disease Maternal Grandfather     Thyroid disease Paternal Grandmother     Vitiligo Paternal Grandmother        Review of Systems  10 systems were reviewed and are negative except as noted specifically in the HPI.    Objective:     Vital signs in last 24 hours:  BP 134/94  - Pulse 85  - Temp 36.5 ??C (97.7 ??F) (Oral)  - Resp 18  - Ht 160 cm (5' 3)  - Wt (!) 140.5 kg (309 lb 11.2 oz)  - SpO2 99%  - BMI 54.86 kg/m??     Intake/Output last 3 shifts:  I/O last 3 completed shifts:  In: 2440 [P.O.:1440; IV Piggyback:1000]  Out: 100 [Urine:100]    Physical Exam  General: NAD, A&O, resting, appropriate  HEENT: [place]/AT, EOMI   Pulmonary: Normal work of breathing on RA   Cardiovascular: Regular rate & rhythm, HDS, adequate peripheral perfusion  Abdomen: soft, NTTP, nondistended,    GU: voiding spontaneously  Extremities: warm and well perfused, no edema  Neuro: Appropriate, no focal neurological deficits    Most Recent Labs:  Lab Results   Component Value Date    WBC 9.4 04/19/2023    HGB 13.5 04/19/2023    HCT 40.1 04/19/2023    PLT 222 04/19/2023       Lab Results   Component Value Date    NA 143 04/19/2023    K 3.9 04/19/2023    CL 107 04/19/2023    CO2 29.6 04/19/2023    BUN 14 04/19/2023    CREATININE 0.70 04/19/2023    CALCIUM 9.2 04/19/2023    MG 1.7 03/26/2023    PHOS 2.2 (L) 03/26/2023       Lab Results   Component Value Date    ALKPHOS 53 04/18/2023    BILITOT 0.6 04/18/2023 BILIDIR 0.20 03/17/2023    PROT 7.3 04/18/2023    ALBUMIN 3.7 04/18/2023    ALT 42 04/18/2023    AST 32 04/18/2023       No results found for: PT, INR, APTT      Urine Culture:  Recent Labs     Units 04/17/23  1812   LABURIN  Mixed Urogenital Flora          IMAGING:  ECG 12 Lead    Result Date: 04/19/2023  NORMAL SINUS RHYTHM NORMAL ECG WHEN COMPARED WITH ECG OF 19-Apr-2023 01:01, NO SIGNIFICANT CHANGE WAS FOUND

## 2023-04-20 NOTE — Unmapped (Signed)
Patient is alert and oriented   Fall precautions maintained  PRN meds given for pain and nausea per order, see MAR   Boyfriend has been at bedside   C/o pain this AM but did not want anything for it, at the time scheduled gabapentin was due so that's what she wanted     Problem: Adult Inpatient Plan of Care  Goal: Plan of Care Review  Outcome: Ongoing - Unchanged  Goal: Patient-Specific Goal (Individualized)  Outcome: Ongoing - Unchanged  Goal: Absence of Hospital-Acquired Illness or Injury  Outcome: Ongoing - Unchanged  Intervention: Identify and Manage Fall Risk  Recent Flowsheet Documentation  Taken 04/20/2023 1400 by Cheri Kearns, RN  Safety Interventions: fall reduction program maintained  Taken 04/20/2023 1200 by Cheri Kearns, RN  Safety Interventions: fall reduction program maintained  Taken 04/20/2023 1000 by Cheri Kearns, RN  Safety Interventions: fall reduction program maintained  Taken 04/20/2023 0800 by Cheri Kearns, RN  Safety Interventions: fall reduction program maintained  Goal: Optimal Comfort and Wellbeing  Outcome: Ongoing - Unchanged  Goal: Readiness for Transition of Care  Outcome: Ongoing - Unchanged  Goal: Rounds/Family Conference  Outcome: Ongoing - Unchanged     Problem: Infection  Goal: Absence of Infection Signs and Symptoms  Outcome: Ongoing - Unchanged

## 2023-04-20 NOTE — Unmapped (Signed)
Family Medicine Inpatient Service  Progress Note    Team: Family Medicine [facility] Si (pgr [phone])  PCP: [name], FNP  Date of Admission: April 18, 2023  Code Status: full code  Emergency Contact: Dellie Burns [phone])    Hospital Day: 2    ASSESSMENT / PLAN:   Brandi Flynn is a 22 y.o. female with a past medical history significant for MS and nephrolithiasis (s/p R ureteral stent placed 03/26/23) who presents with persistent flank pain, dysuria, and worsening nausea/vomiting/diarrhea, inability to tolerate PO.    # Renal Colic 2/2 R nephrolithiasis s/p ureteral stent   Patient has had six hospitalizations for flank pain following R ureteral stent placement on 6/29, with CT scan on 7/22 showing migration of both R ureteral stent and calculus, likely causing her increased nausea, vomiting, and flank pain over the last few days. Urology has been consulted and is following, with pain recommendations described below. Lithotripsy and stent removal is scheduled for 7/26 and  [facility]. She remains afebrile but continues to endorse flank pain, with two episodes of vomiting overnight (7/23 PM - 7/24 AM), one of which following deep palpation of her right flank. Urine cultures from 7/22 still pending. Overall, symptoms are likely 2/2 stent discomfort and nephrolithiasis, less likely due to pyelonephritis/UTI. Pain management is the primary goal for the patient, and she would like to avoid opioids given family history of OUD. She was trialed on 600mg  gabapentin dose on 7/23 but did not enjoy how it made her feel (described as hazy and out of it), so plan will be to continue 300mg  TID. Urology also recommended increasing ditropan to TID and starting pyridium for dysuria, and starting Tamsulosin; however, patient noted that a prior trial caused elevated HR and decreased BP so she would not like to start the medication. Prior notes recommended Sildodosin as an alternative but this was not approved by insurance. We will also add PRN ice packs and a heating pad to improve comfort.  Urology consulted and is following, appreciate recs  Pain regimen:   Scheduled PO Tylenol 1g q8hr, Gabapentin 300 mg TID, Pyridium 200mg  TID, Oxybutynin 5mg  PO TID  As needed IV toradol 15 mg q6hr, lidocaine patch, ice pack and heating pad  Plan to keep admitted until planned surgery with Urology on 7/26, will need day trip to [facility] for procedure  NPO at midnight night before     # Nausea/Vomiting - Diarrhea  # Inability to tolerate PO  Patient has been experiencing nausea, vomiting over past few weeks, now also having diarrhea in the past few days. She has had decreased PO intake lately due to inability to tolerate PO. S/p 1L IVF in ED. EKG on admission showed NSR, QTc 453 ms. Viral gastroenteritis and C. Diff workup both negative 7/23. She has progressively been tolerating solids and liquids better following Zofran administration, but still endorses occasional vomiting. Encouraged increased water intake throughout the day and meals as tolerable. Will continue to monitor PO toleration.  Zofran 4mg  ODT PRN q6h, if unable to tolerate PO has Zofran 4mg  IV q6hr PRN  Consider repeat EKG if using often  Advance diet as tolerated  Encourage PO hydration, if unable to tolerate then restart IV rehydration therapy    # Multiple Sclerosis  Reports worsening blurriness and leg pain, similar symptoms that she felt before her first flare (and subsequent diagnosis of MS) in April 2024. Continue to monitor and advise outpatient follow-up after surgery. Encourage ambulation as tolerated.  Refresh  celluvisc 1% 1 drop to each eye QID    # Headache  Has been taking high doses of tylenol for the past 2.5 weeks and describes waking up with headaches that are unremitting until she is about to go to bed. She noted some photophobia as well. Likely due to recent poor PO intake and increased analgesic use. Encouraged increased hydration and hopeful weaning off of tylenol following 7/26 procedure.   Will continue to monitor    # Checklist:  - IVF: None  - Daily labs needed: BMP  - Diet: Regular  - Bowel Regimen: No indication for a bowel regimen at this time  - DVT: SQ Lovenox, has declined x2 but amenable to continued use  - Code Status:   Orders Placed This Encounter   Procedures    Full Code     Standing Status:   Standing     Number of Occurrences:   1     - Dispo: Floor  [ ]  Anticipated Discharge Location: Home  [ ]  PT/OT/DME: No needs anticipated  [ ]  CM/SW needs: None anticipated  [ ]  Follow up appt: Has follow up appointment with Urology for lithotripsy and stent removal on 7/26    SUBJECTIVE:  Interval events:   Slept off and on throughout the night. Was visited by urology prior and had an episode of vomiting following R flank palpation. She has been able to keep breakfast down after taking Zofran beforehand, and had 2 episodes of loose bowel movements overnight. She continues to have pain in her R flank and was about to receive her AM dose of gabapentin. She noted that she would prefer to remain inpatient until her procedure on 7/26, for which we decided to circle back on following discussion with the larger team. She also stated that she was feeling thirsty and wanted to know if her labs indicated dehydration, to which she was encouraged to continue increasing her PO water intake throughout the day.    REVIEW OF SYSTEMS:  Pertinent positives and negatives per HPI. A complete review of systems otherwise negative.    PHYSICAL EXAM:  Temp:  [36.4 ??C (97.5 ??F)-36.8 ??C (98.2 ??F)] 36.4 ??C (97.5 ??F)  Heart Rate:  [82-85] 82  Resp:  [18] 18  BP: (134-143)/(83-94) 143/91  SpO2:  [97 %-99 %] 97 %    Intake/Output Summary (Last 24 hours) at 04/20/2023 1251  Last data filed at 04/19/2023 2200  Gross per 24 hour   Intake 1200 ml   Output --   Net 1200 ml     GEN: sitting up in bed, NAD, described continued pain in her R flank and was preparing to take a nap following our visit  HEENT: NCAT, No scleral icterus. Conjunctiva non-erythematous. MMM.  CV: Regular rate and rhythm. No murmurs/rubs/gallops.  Pulm: Normal work of breathing. CTAB. No wheezing, crackles, or rhonchi.  Abd: TTP in right upper and lower quadrants. No guarding, rebound. Hypoactive bowel sounds. R >>> L CVA tenderness.   Neuro: A&O x 3. No focal deficits.  Ext: No peripheral edema. Palpable distal pulses.  Skin: No rashes or skin lesions.     LABS/ STUDIES:    All imaging, laboratory studies, and other pertinent tests including electrocardiography within the last 24 hours were reviewed and are summarized within the assessment and plan.     Lilla Shook, MS3 04/20/2023     I attest that I have reviewed the student note and that the components of the history  of the present illness, the physical exam, and the assessment and plan documented were performed by me or were performed in my presence by the student where I verified the documentation and performed (or re-performed) the exam and medical decision making.     Cherie Dark, MD  Resident Physician Family Medicine, PGY-2

## 2023-04-20 NOTE — Unmapped (Signed)
Patient nausea controlled with antiemetics prn.  Pain controlled with prn medications.  Patient to have ureteral stent exchanged Friday at the main hospital.  Female visitor at bedside. OOB to bathroom with standby assist and walker.     Problem: Adult Inpatient Plan of Care  Goal: Plan of Care Review  Outcome: Ongoing - Unchanged  Flowsheets (Taken 04/20/2023 0228)  Progress: no change  Outcome Evaluation: Pain management, for ureteral stent exchange on 7/26 at main hospital  Plan of Care Reviewed With: patient     Problem: Adult Inpatient Plan of Care  Goal: Patient-Specific Goal (Individualized)  Flowsheets (Taken 04/20/2023 0228)  Patient/Family-Specific Goals (Include Timeframe): Patient will remain free from falls during my shift tonight  Individualized Care Needs: fall precautions, monitor intake and output, monitor blood glucose prn, pain management  Anxieties, Fears or Concerns: none voiced  Goal: Absence of Hospital-Acquired Illness or Injury  Intervention: Identify and Manage Fall Risk  Flowsheets  Taken 04/20/2023 0228  Safety Interventions:   fall reduction program maintained   low bed   nonskid shoes/slippers when out of bed   family at bedside  Taken 04/20/2023 0200  Safety Interventions:   fall reduction program maintained   low bed   nonskid shoes/slippers when out of bed  Taken 04/20/2023 0000  Safety Interventions:   fall reduction program maintained   low bed   nonskid shoes/slippers when out of bed   isolation precautions  Taken 04/19/2023 2200  Safety Interventions:   fall reduction program maintained   family at bedside   low bed   nonskid shoes/slippers when out of bed   isolation precautions  Taken 04/19/2023 2000  Safety Interventions:   fall reduction program maintained   family at bedside   low bed   nonskid shoes/slippers when out of bed   isolation precautions  Intervention: Prevent Skin Injury  Flowsheets (Taken 04/20/2023 0228)  Skin Protection:   incontinence pads utilized   adhesive use limited  Intervention: Prevent and Manage VTE (Venous Thromboembolism) Risk  Flowsheets  Taken 04/20/2023 0228  VTE Prevention/Management: anticoagulant therapy  Taken 04/19/2023 2100  VTE Prevention/Management: anticoagulant therapy  Intervention: Prevent Infection  Flowsheets (Taken 04/20/2023 0228)  Infection Prevention: hand hygiene promoted  Goal: Optimal Comfort and Wellbeing  Intervention: Monitor Pain and Promote Comfort  Flowsheets (Taken 04/20/2023 0228)  Pain Management Interventions: pain management plan reviewed with patient/caregiver  Intervention: Provide Person-Centered Care  Flowsheets (Taken 04/20/2023 0228)  Trust Relationship/Rapport:   care explained   questions encouraged   choices provided   reassurance provided   emotional support provided   thoughts/feelings acknowledged   empathic listening provided   questions answered  Goal: Readiness for Transition of Care  Intervention: Mutually Develop Transition Plan  Flowsheets (Taken 04/20/2023 0228)  Concerns to be Addressed:   adjustment to diagnosis/illness   discharge planning  Goal: Rounds/Family Conference  Flowsheets (Taken 04/20/2023 0228)  Participants:   nursing   physician   patient     Problem: Infection  Goal: Absence of Infection Signs and Symptoms  Intervention: Prevent or Manage Infection  Recent Flowsheet Documentation  Taken 04/20/2023 0200 by Wynn Maudlin, RN  Isolation Precautions: enteric precautions discontinued  Taken 04/20/2023 0000 by Wynn Maudlin, RN  Isolation Precautions: enteric precautions maintained  Taken 04/19/2023 2200 by Wynn Maudlin, RN  Isolation Precautions: enteric precautions maintained  Taken 04/19/2023 2000 by Wynn Maudlin, RN  Isolation Precautions: enteric precautions maintained

## 2023-04-20 NOTE — Unmapped (Signed)
New Urology Consult Note      Requesting Attending Physician:  Bernette Redbird, MD  Service Requesting Consult:  Family Medicine [facility])  Service Providing Consult: [facility]  Consulting Attending: Dr. Guy Sandifer    Assessment:    Patient is a 22 y.o. female with history of hypothyroidism, MS, obesity and nephrolithiasis who is s/p R ureteral stent placement for a 6 mm proximal ureteral stone on 03/26/2023. She is scheduled for USE this Friday, 7/26 at [facility]. She presented to the [facility] ER yesterday for complaints of worsening pain, ongoing n/v and inability to tolerate po intake.     n the ER she was afebrile and HDS. Vital signs have remained stable. Labs were wnl. UA appeared consistent with having an indwelling ureteral stent. Urine culture is pending. CT abd/pelvis was done which showed migration of proximal end of stent into proximal ureter and 6 mm stone in renal pelvis with mild hydronephrosis.     Discussed with patient that her CT scan shows mild migration of ureteral stent and new mild hydronephrosis. This can cause some increased discomfort. However, the hydronephrosis is most likely due to reflux of urine with stent in place and not signs of obstruction.     Discussed with patient that we do not recommend removing her ureteral stent prior to surgery due to possibility of introduction of bacteria into her urinary tract resulting in an infection which could delay her surgery as well as the possibility of having severe, recurrent pain from the stone. Also discussed that unfortunately that due to surgeon availability we are unable to perform her surgery prior to Friday. We will need to attempt to control her symptoms which may mean remaining admitted until surgery.     Recommendations:  1. Multi-modal pain control for stent discomfort. Currently receiving scheduled Tylenol, lidocaine patch, toradol scheduled, ditropan bid and gabapentin. Recommend increasing ditropan to 5 mg tid along with adding Flomax daily and pyridium tid. May also try applying heating pad to R flank.   2. Will continue with RIGHT USE as scheduled. If she remains admitted she will need to be transferred to main campus prior to surgery on Friday. She will be need to be made NPO after MN Thursday night.   3. Follow-up urine culture results. Not currently receiving antibiotics.   4. Remainder of treatment plan per primary team.     Discussed with Dr. Jennette Dubin. Dr. Guy Sandifer was available.     Thank you for this consult. Please contact the [facility] urology consult pager with any further questions/concerns.    [name], MPAP, PA-C  Reconstructive Urology at [facility], [place]   04/19/23  Phone: [phone]      History of Present Illness:     Reason for Consult:  n/v and worsening pain w/ ureteral stent     Brandi Flynn is seen in consultation for reasons noted above at the request of Bernette Redbird, MD on the Family Medicine [facility]) service.    This is a 22 yo patient with a history of hypothyroidism, MS, obesity and nephrolithiasis who is s/p R ureteral stent placement for a 6 mm proximal ureteral stone on 03/26/2023. She is scheduled for USE this Friday, 7/26 at [facility]. She presented to the [facility] ER yesterday for complaints of worsening pain, ongoing n/v and inability to tolerate po intake. This was her 6th ER visit since stent placement due to discomfort. Has been taking ditropan but now not able to  tolerate po.     In the ER she was afebrile and HDS. Vital signs have remained stable. Labs were wnl. UA appeared consistent with having an indwelling ureteral stent. Urine culture is pending. CT abd/pelvis was done which showed migration of proximal end of stent into proximal ureter and 6 mm stone in renal pelvis with mild hydronephrosis.     Today she denies any fever, chills or gross hematuria.       Past Medical History:  Past Medical History:   Diagnosis Date    Disease of thyroid gland     Multiple sclerosis (CMS-HCC) Obesity     Vertigo        Past Surgical History:   Past Surgical History:   Procedure Laterality Date    PR CYSTOSCOPY,INSERT URETERAL STENT Right 03/26/2023    Procedure: CYSTOURETHROSCOPY,  WITH INSERTION OF INDWELLING URETERAL STENT (EG, GIBBONS OR DOUBLE-J TYPE);  Surgeon: Annye English, MD;  Location: MAIN OR [facility];  Service: Urology    PR CYSTOURETHROSCOPY,URETER CATHETER Right 03/26/2023    Procedure: CYSTOURETHROSCOPY, W/URETERAL CATHETERIZATION, W/WO IRRIG, INSTILL, OR URETEROPYELOG, EXCLUS OF RADIOLG SVC;  Surgeon: Annye English, MD;  Location: MAIN OR [facility];  Service: Urology       Medication:  Current Facility-Administered Medications   Medication Dose Route Frequency Provider Last Rate Last Admin    acetaminophen (TYLENOL) tablet 1,000 mg  1,000 mg Oral Q8H SCH Bernette Redbird, MD   1,000 mg at 04/19/23 1732    carboxymethylcellulose sodium (REFRESH CELLUVISC) 1 % ophthalmic gel 1 drop  1 drop Both Eyes QID Bernette Redbird, MD   1 drop at 04/19/23 2013    cholecalciferol (vitamin D3 25 mcg (1,000 units)) tablet 25 mcg  25 mcg Oral Nightly Wilber Oliphant, MD   25 mcg at 04/19/23 2048    enoxaparin (LOVENOX) syringe 40 mg  40 mg Subcutaneous Q12H Repko, Alexander J, DO   40 mg at 04/19/23 1240    gabapentin (NEURONTIN) capsule 300 mg  300 mg Oral BID Syptak, Caitlyn M, MD   300 mg at 04/19/23 1409    ketorolac (TORADOL) injection 15 mg  15 mg Intravenous Q6H PRN Kaiser, Dallas D, DO   15 mg at 04/19/23 1943    lidocaine 4 % patch 1 patch  1 patch Transdermal Daily Bernette Redbird, MD   1 patch at 04/19/23 0847    ondansetron (ZOFRAN) injection 4 mg  4 mg Intravenous Q6H PRN Syptak, Caitlyn M, MD        ondansetron (ZOFRAN-ODT) disintegrating tablet 4 mg  4 mg Oral Q8H PRN Syptak, Caitlyn M, MD   4 mg at 04/19/23 1409    oxybutynin (DITROPAN) tablet 5 mg  5 mg Oral BID Kaiser, Dallas D, DO   5 mg at 04/19/23 2012       Allergies:  Allergies   Allergen Reactions    Blueberry Hives Peanut Swelling     Swelling tongue       Social History:  Social History     Tobacco Use    Smoking status: Every Day     Types: e-Cigarettes    Smokeless tobacco: Never    Tobacco comments:     Vapes   Vaping Use    Vaping status: Former    Substances: Nicotine   Substance Use Topics    Alcohol use: Not Currently    Drug use: Never       Family History  Family History  Problem Relation Age of Onset    Thyroid disease Mother     Thyroid disease Maternal Grandmother     Thyroid disease Maternal Grandfather     Thyroid disease Paternal Grandmother     Vitiligo Paternal Grandmother        Review of Systems  10 systems were reviewed and are negative except as noted specifically in the HPI.    Objective:     Vital signs in last 24 hours:  BP 134/94  - Pulse 85  - Temp 36.5 ??C (97.7 ??F) (Oral)  - Resp 18  - Ht 160 cm (5' 3)  - Wt (!) 140.5 kg (309 lb 11.2 oz)  - SpO2 99%  - BMI 54.86 kg/m??     Intake/Output last 3 shifts:  I/O last 3 completed shifts:  In: 1960 [P.O.:960; IV Piggyback:1000]  Out: 100 [Urine:100]    Physical Exam  General: NAD, A&O, resting, appropriate  HEENT: [place]/AT, EOMI   Pulmonary: Normal work of breathing on RA   Cardiovascular: Regular rate & rhythm, HDS, adequate peripheral perfusion  Abdomen: soft, NTTP, nondistended,    GU: voiding spontaneously  Extremities: warm and well perfused, no edema  Neuro: Appropriate, no focal neurological deficits    Most Recent Labs:  Lab Results   Component Value Date    WBC 9.4 04/19/2023    HGB 13.5 04/19/2023    HCT 40.1 04/19/2023    PLT 222 04/19/2023       Lab Results   Component Value Date    NA 143 04/19/2023    K 3.9 04/19/2023    CL 107 04/19/2023    CO2 29.6 04/19/2023    BUN 14 04/19/2023    CREATININE 0.70 04/19/2023    CALCIUM 9.2 04/19/2023    MG 1.7 03/26/2023    PHOS 2.2 (L) 03/26/2023       Lab Results   Component Value Date    ALKPHOS 53 04/18/2023    BILITOT 0.6 04/18/2023    BILIDIR 0.20 03/17/2023    PROT 7.3 04/18/2023    ALBUMIN 3.7 04/18/2023    ALT 42 04/18/2023    AST 32 04/18/2023       No results found for: PT, INR, APTT      Urine Culture:  Recent Labs     Units 04/17/23  1812   LABURIN  Mixed Urogenital Flora          IMAGING:  ECG 12 Lead    Result Date: 04/19/2023  NORMAL SINUS RHYTHM NORMAL ECG WHEN COMPARED WITH ECG OF 19-Apr-2023 01:01, NO SIGNIFICANT CHANGE WAS FOUND    CT abdomen pelvis without contrast    Result Date: 04/18/2023  EXAM: CT ABDOMEN PELVIS WO CONTRAST ACCESSION: [id number] UN CLINICAL INDICATION: 22 years old with R kidney stone      COMPARISON: CT abdomen pelvis 03/30/2023.     TECHNIQUE: A spiral CT scan was obtained without IV contrast from the lung bases to the pubic symphysis.  Images were reconstructed in the axial plane. Coronal and sagittal reformatted images were also provided for further evaluation.     Evaluation of the solid organs and vasculature is limited in the absence of intravenous contrast.         FINDINGS:     LOWER CHEST: Unremarkable.     LIVER: Normal liver contour. Diffuse hepatic steatosis. No focal liver lesion on non-contrast examination.     BILIARY: The gallbladder is normal in appearance. No intrahepatic biliary ductal dilatation.  SPLEEN: Normal in size and contour.     PANCREAS: Normal pancreatic contour without signs of inflammation or gross ductal dilatation.     ADRENAL GLANDS: Normal appearance of the adrenal glands.     KIDNEYS/URETERS: Smooth renal contours.  No nephrolithiasis.  No ureteral dilatation or collecting system distention.     BLADDER: Right nephroureteral stent with proximal portion coiled at the ureteropelvic junction (3:58) and distal portion coiled in the bladder. Mild inflammatory stranding along the course of the right ureter (2:104 for reference). There is a 6 mm nonobstructive right renal stone in the lower pole. Mild right hydronephrosis which is new compared to prior. No hydronephrosis on the left side.     REPRODUCTIVE ORGANS: Unremarkable.     GI TRACT: No findings of bowel obstruction or acute inflammation. Normal appendix. Colonic diverticulosis.     PERITONEUM/RETROPERITONEUM AND MESENTERY: No free air. No ascites. No fluid collection.     VASCULATURE: Normal caliber aorta. Otherwise, limited evaluation without contrast.     LYMPH NODES: No adenopathy.     BONES and SOFT TISSUES: No aggressive osseous lesions. No focal soft tissue lesions.         Mild migration of the proximal ureteral stent into the ureteropelvic junction with new mild right hydronephrosis.     A nonobstructive 0.6 cm calculus in the right lower pole kidney which is likely secondary to the migration of the previously identified proximal/mid ureter stone to the inferior calyx of the right kidney. No additional stones are identified along the right ureter.     Nonspecific right periureteral inflammatory stranding, possibly related to indwelling stent migration or suggestive of ascending urinary tract infection; new since 03/30/2023 CT.     Additional chronic/incidental findings as described in the body of the report.     ==================== MODIFIED REPORT: (04/18/2023 8:57 AM) This report has been modified from its preliminary version; you may check the prior versions of radiology report, results history link for prior report versions (if they were previously visible in Epic).     -----------------------------------------------

## 2023-04-21 LAB — BASIC METABOLIC PANEL
ANION GAP: 7 mmol/L (ref 5–14)
BLOOD UREA NITROGEN: 12 mg/dL (ref 9–23)
BUN / CREAT RATIO: 18
CALCIUM: 9.4 mg/dL (ref 8.7–10.4)
CHLORIDE: 106 mmol/L (ref 98–107)
CO2: 28.6 mmol/L (ref 20.0–31.0)
CREATININE: 0.65 mg/dL
EGFR CKD-EPI (2021) FEMALE: >90 mL/min/{1.73_m2} (ref >=60)
GLUCOSE RANDOM: 104 mg/dL (ref 70–179)
POTASSIUM: 4.2 mmol/L (ref 3.4–4.8)
SODIUM: 142 mmol/L (ref 135–145)

## 2023-04-21 MED ADMIN — ondansetron (ZOFRAN-ODT) disintegrating tablet 4 mg: 4 mg | ORAL | @ 04:00:00 | NDC 71930001752

## 2023-04-21 MED ADMIN — ondansetron (ZOFRAN-ODT) disintegrating tablet 4 mg: 4 mg | ORAL | @ 12:00:00 | NDC 71930001752

## 2023-04-21 MED ADMIN — oxybutynin (DITROPAN) tablet 5 mg: 5 mg | ORAL | @ 12:00:00 | NDC 64376040216

## 2023-04-21 MED ADMIN — gabapentin (NEURONTIN) capsule 300 mg: 300 mg | ORAL | NDC 76420004790

## 2023-04-21 MED ADMIN — ketorolac (TORADOL) injection 15 mg: 15 mg | INTRAVENOUS | @ 13:00:00 | Stop: 2023-04-21 | NDC 00409228831

## 2023-04-21 MED ADMIN — gabapentin (NEURONTIN) capsule 300 mg: 300 mg | ORAL | @ 18:00:00 | NDC 76420004790

## 2023-04-21 MED ADMIN — carboxymethylcellulose sodium (REFRESH CELLUVISC) 1 % ophthalmic gel 1 drop: 1 [drp] | OPHTHALMIC | NDC 70000008901

## 2023-04-21 MED ADMIN — acetaminophen (TYLENOL) tablet 1,000 mg: 1000 mg | ORAL | @ 12:00:00 | NDC 53191040901

## 2023-04-21 MED ADMIN — oxybutynin (DITROPAN) tablet 5 mg: 5 mg | ORAL | @ 18:00:00 | NDC 64376040216

## 2023-04-21 MED ADMIN — carboxymethylcellulose sodium (REFRESH CELLUVISC) 1 % ophthalmic gel 1 drop: 1 [drp] | OPHTHALMIC | @ 09:00:00 | NDC 70000008901

## 2023-04-21 MED ADMIN — carboxymethylcellulose sodium (REFRESH CELLUVISC) 1 % ophthalmic gel 1 drop: 1 [drp] | OPHTHALMIC | @ 20:00:00 | NDC 70000008901

## 2023-04-21 MED ADMIN — cefdinir (OMNICEF) capsule 300 mg: 300 mg | ORAL | @ 20:00:00 | Stop: 2023-04-24 | NDC 72789006120

## 2023-04-21 MED ADMIN — cholecalciferol (vitamin D3 25 mcg (1,000 units)) tablet 25 mcg: 25 ug | ORAL | Stop: 2023-04-24 | NDC 53191040901

## 2023-04-21 MED ADMIN — acetaminophen (TYLENOL) tablet 1,000 mg: 1000 mg | ORAL | @ 20:00:00 | NDC 53191040901

## 2023-04-21 MED ADMIN — oxybutynin (DITROPAN) tablet 5 mg: 5 mg | ORAL | NDC 64376040216

## 2023-04-21 MED ADMIN — ketorolac (TORADOL) injection 15 mg: 15 mg | INTRAVENOUS | @ 20:00:00 | Stop: 2023-04-21 | NDC 00409228831

## 2023-04-21 MED ADMIN — gabapentin (NEURONTIN) capsule 300 mg: 300 mg | ORAL | @ 12:00:00 | NDC 76420004790

## 2023-04-21 NOTE — Unmapped (Signed)
Problem: Adult Inpatient Plan of Care  Goal: Plan of Care Review  Outcome: Ongoing - Unchanged  Goal: Patient-Specific Goal (Individualized)  Outcome: Ongoing - Unchanged  Goal: Absence of Hospital-Acquired Illness or Injury  Outcome: Ongoing - Unchanged  Intervention: Identify and Manage Fall Risk  Recent Flowsheet Documentation  Taken 04/20/2023 2000 by Wonda Olds, RN  Safety Interventions:   bed alarm   fall reduction program maintained   lighting adjusted for tasks/safety   low bed   family at bedside  Intervention: Prevent Infection  Recent Flowsheet Documentation  Taken 04/20/2023 2000 by Wonda Olds, RN  Infection Prevention:   hand hygiene promoted   cohorting utilized  Goal: Optimal Comfort and Wellbeing  Outcome: Ongoing - Unchanged  Goal: Readiness for Transition of Care  Outcome: Ongoing - Unchanged  Goal: Rounds/Family Conference  Outcome: Ongoing - Unchanged     Problem: Pain Acute  Goal: Optimal Pain Control and Function  Outcome: Ongoing - Unchanged     Problem: Nausea and Vomiting  Goal: Nausea and Vomiting Relief  Outcome: Ongoing - Unchanged     Problem: Fall Injury Risk  Goal: Absence of Fall and Fall-Related Injury  Outcome: Ongoing - Unchanged  Intervention: Promote Injury-Free Environment  Recent Flowsheet Documentation  Taken 04/20/2023 2000 by Wonda Olds, RN  Safety Interventions:   bed alarm   fall reduction program maintained   lighting adjusted for tasks/safety   low bed   family at bedside

## 2023-04-21 NOTE — Unmapped (Signed)
Patient is alert and oriented   Stand by assist with walker   Boyfriend at bedside   PRN meds given for pain and nausea per request   Starting antibiotic today   IV was placed using ultrasound   NPO 7/26 @0000   Problem: Adult Inpatient Plan of Care  Goal: Plan of Care Review  Outcome: Ongoing - Unchanged  Goal: Patient-Specific Goal (Individualized)  Outcome: Ongoing - Unchanged  Goal: Absence of Hospital-Acquired Illness or Injury  Outcome: Ongoing - Unchanged  Intervention: Identify and Manage Fall Risk  Recent Flowsheet Documentation  Taken 04/21/2023 1000 by Cheri Kearns, RN  Safety Interventions: fall reduction program maintained  Taken 04/21/2023 0800 by Cheri Kearns, RN  Safety Interventions: fall reduction program maintained  Goal: Optimal Comfort and Wellbeing  Outcome: Ongoing - Unchanged  Goal: Readiness for Transition of Care  Outcome: Ongoing - Unchanged  Goal: Rounds/Family Conference  Outcome: Ongoing - Unchanged     Problem: Infection  Goal: Absence of Infection Signs and Symptoms  Outcome: Ongoing - Unchanged     Problem: Pain Acute  Goal: Optimal Pain Control and Function  Outcome: Ongoing - Unchanged     Problem: Nausea and Vomiting  Goal: Nausea and Vomiting Relief  Outcome: Ongoing - Unchanged     Problem: Fall Injury Risk  Goal: Absence of Fall and Fall-Related Injury  Outcome: Ongoing - Unchanged  Intervention: Promote Injury-Free Environment  Recent Flowsheet Documentation  Taken 04/21/2023 1000 by Cheri Kearns, RN  Safety Interventions: fall reduction program maintained  Taken 04/21/2023 0800 by Cheri Kearns, RN  Safety Interventions: fall reduction program maintained

## 2023-04-21 NOTE — Unmapped (Signed)
Family Medicine Inpatient Service  Progress Note    Team: Family Medicine [place] [facility] (pgr [phone])  PCP: [name], FNP  Date of Admission: April 18, 2023  Code Status: full code  Emergency Contact: Dellie Burns [phone])    Hospital Day: 3    ASSESSMENT / PLAN:   Brandi Flynn is a 22 y.o. female with a past medical history significant for MS and nephrolithiasis (s/p R ureteral stent placed 03/26/23) who presents with persistent flank pain, dysuria, and worsening nausea/vomiting/diarrhea, inability to tolerate PO.    # Renal Colic 2/2 R nephrolithiasis s/p ureteral stent   Patient has had six hospitalizations for flank pain following R ureteral stent placement on 6/29, with CT scan on 7/22 showing migration of both R ureteral stent and calculus, likely causing her increased nausea, vomiting, and flank pain over the last few days. Urology has been consulted and is following, with pain recommendations described below. Lithotripsy and stent removal originally scheduled for 7/26 at [facility] has now been shifted to an add-on procedure at [facility] on 7/26. She remains afebrile but continues to endorse flank pain, with one episode of vomiting following dinner on 7/24 PM. Urine cultures from 7/22 remain too young to read as of 7/25 AM. Overall, symptoms are likely 2/2 stent discomfort and nephrolithiasis, less likely due to pyelonephritis/UTI, but Urology recommends empiric treatment prior to surgery given urine culture status. Pain management is the primary goal for the patient, and she would like to avoid opioids given family history of OUD. The current pain regimen appears to mostly be covering her pain, with increased ditropan frequency to TID and continued gabapentin 300mg  TID with toradol 15mg  q6hr PRN. Plan is to continue her current pain regimen, as described below, and keep her inpatient following recovery for her procedure tomorrow.  Urology consulted and is following, appreciate recs  Begin empiric cefdinir 300mg  BID (7/25-) until urine culture results  Pain regimen:   Scheduled PO Tylenol 1g q8hr, Gabapentin 300 mg TID, Oxybutynin 5mg  PO TID  As needed IV toradol 15 mg q6hr, lidocaine patch, ice pack and heating pad  Plan to keep admitted until surgery, scheduled as an add-on at [facility] on 7/26  NPO at 7/26 midnight     # Nausea/Vomiting - Resolved diarrhea  # Inability to tolerate PO  Patient has been experiencing nausea and vomiting over past few weeks, with now resolving diarrhea. She has had decreased PO intake lately due to inability to tolerate PO without vomiting. S/p 1L IVF in ED. EKG on admission showed NSR, QTc 453 ms. Viral gastroenteritis and C. Diff workup both negative 7/23. She has progressively been tolerating solids and liquids better following Zofran administration, but still endorses occasional vomiting. She is now aware of her Zofran PRN schedule and is planning to schedule her meals around the time she receives her Zofran. Encouraged increased water intake throughout the day and meals as tolerable. Will continue to monitor PO toleration.  Zofran 4mg  ODT PRN q6h, if unable to tolerate PO has Zofran 4mg  IV q6hr PRN  Consider repeat EKG if using often  Advance diet as tolerated  Encourage PO hydration, if unable to tolerate then restart IV rehydration therapy    # Multiple Sclerosis  Reports worsening blurriness and leg pain, similar symptoms that she felt before her first flare (and subsequent diagnosis of MS) in April 2024. Continue to monitor and advise outpatient follow-up after surgery. Encourage ambulation as tolerated.  Refresh celluvisc 1% 1 drop to each  eye QID    # Headache  Has been taking high doses of tylenol for the past 2.5 weeks and describes waking up with headaches that are unremitting until she is about to go to bed. She noted some photophobia as well. Likely due to recent poor PO intake and increased analgesic use. Encouraged increased hydration and hopeful weaning off of tylenol following 7/26 procedure. She denies any headache on 7/25 AM, which is a notable improvement from baseline.  Will continue to monitor    # Checklist:  - IVF: None  - Daily labs needed: BMP  - Diet: Regular  - Bowel Regimen: No indication for a bowel regimen at this time  - DVT: Hold SQ Lovenox given impending procedure  - Code Status:   Orders Placed This Encounter   Procedures    Full Code     Standing Status:   Standing     Number of Occurrences:   1     - Dispo: Floor  [ ]  Anticipated Discharge Location: Home  [ ]  PT/OT/DME: No needs anticipated  [ ]  CM/SW needs: None anticipated  [ ]  Follow up appt: Has follow up appointment with Urology following lithotripsy and stent removal on 7/26    SUBJECTIVE:  Interval events:   Slept until around 3AM but couldn't go back to sleep given flank pain. She had another episode of vomiting following dinner last night around 10pm and hasn't tried eating since then. Breakfast had been ordered and she was waiting to receive her Zofran prior to eating. We also discussed eating in smaller amounts at one time and letting time pass between eating to hopefully assist with the nausea. She had one bowel movement overnight that was solid, which is an improvement from prior. She has tolerated liquids better since yesterday.     REVIEW OF SYSTEMS:  Pertinent positives and negatives per HPI. A complete review of systems otherwise negative.    PHYSICAL EXAM:  Temp:  [36.7 ??C (98.1 ??F)-37.1 ??C (98.8 ??F)] 37.1 ??C (98.8 ??F)  Heart Rate:  [87-89] 89  Resp:  [14-16] 16  BP: (109-150)/(57-93) 150/93  SpO2:  [98 %-100 %] 100 %    Intake/Output Summary (Last 24 hours) at 04/21/2023 1206  Last data filed at 04/20/2023 2000  Gross per 24 hour   Intake --   Output 350 ml   Net -350 ml     GEN: sitting up in bed, NAD, described continued pain in her R flank, was waiting for Zofran prior to eating breakfast  HEENT: NCAT, No scleral icterus. Conjunctiva non-erythematous. MMM.  CV: Regular rate and rhythm. No murmurs/rubs/gallops.  Pulm: Normal work of breathing. CTAB. No wheezing, crackles, or rhonchi.  Abd: TTP in right upper and lower quadrants. No guarding, rebound. Normoactive bowel sounds. R >>> L CVA tenderness.   Neuro: A&O x 3. No focal deficits.  Ext: No peripheral edema. Palpable distal pulses.  Skin: No rashes or skin lesions.     LABS/ STUDIES:    All imaging, laboratory studies, and other pertinent tests including electrocardiography within the last 24 hours were reviewed and are summarized within the assessment and plan.     Lilla Shook, MS3 04/21/2023     I attest that I have reviewed the student note and that the components of the history of the present illness, the physical exam, and the assessment and plan documented were performed by me or were performed in my presence by the student where I verified the  documentation and performed (or re-performed) the exam and medical decision making.     Aurelio Jew MD PGY2

## 2023-04-21 NOTE — Unmapped (Signed)
ULTRASOUND PIV PROCEDURE NOTE    Indications:   Poor venous access.    Ultrasound guidance was necessary to obtain access.     Procedure Details:  Identity of the patient was confirmed via name, medical record number and date of birth. The availability of the correct equipment was verified.    The vein was identified and measured for ultrasound catheter insertion.       Vein measurement (without tourniquet):   0.33 cm   A(n) 20 gauge 1.75 catheter was selected based on the recommendations below:    [facility] Catheter/Vein Ratio Guidelines    Chart to determine PIV catheter size/length to use based on vein diameter and depth   Catheter Gauge Size (g)  22g 20g 18g   Catheter length (inches)  1.75 1.75 1.75   Catheter diameter measurement (mm) 0.9 mm 1.1 mm 1.3 mm          Minimum required vein diameter       Sonosite (cm)  0.27 cm 0.33 cm 0.39 cm          Maximum vein depth  1.25 cm 1.25 cm 1.25 cm          The field was prepared with necessary supplies and equipment.  Probe cover and sterile gel utilized. Insertion site was prepped with chlorhexidineand allowed to dry.  The catheter extension was primed with normal saline.  The [place] PIV was placed in the L Forearm with 1attempt(s). See LDA for additional details.    Catheter aspirated, 3 mL blood return present. The catheter was then flushed with 10 mL of normal saline. Insertion site cleansed, and dressing applied per manufacturer guidelines. The catheter was inserted with difficulty due to poor vasculature by Westley Foots, RN.    Thank you,     Westley Foots, RN    Ultrasound Resource Nurse    Workup / Procedure Time:  30 minutes

## 2023-04-21 NOTE — Unmapped (Signed)
New Urology Consult Note      Requesting Attending Physician:  Axel Filler, MD  Service Requesting Consult:  Family Medicine [facility])  Service Providing Consult: [facility]  Consulting Attending: Dr. Guy Sandifer    Assessment:    Patient is a 22 y.o. female with history of hypothyroidism, MS, obesity and nephrolithiasis who is s/p R ureteral stent placement for a 6 mm proximal ureteral stone on 03/26/2023. She is scheduled for USE this Friday, 7/26 at [facility]. She presented to the [facility] ER yesterday for complaints of worsening pain, ongoing n/v and inability to tolerate po intake.     n the ER she was afebrile and HDS. Vital signs have remained stable. Labs were wnl. UA appeared consistent with having an indwelling ureteral stent. Urine culture is pending. CT abd/pelvis was done which showed migration of proximal end of stent into proximal ureter and 6 mm stone in renal pelvis with mild hydronephrosis.     Discussed with patient that her CT scan shows mild migration of ureteral stent and new mild hydronephrosis. This can cause some increased discomfort. However, the hydronephrosis is most likely due to reflux of urine with stent in place and not signs of obstruction.     Discussed with patient that we do not recommend removing her ureteral stent prior to surgery due to possibility of introduction of bacteria into her urinary tract resulting in an infection which could delay her surgery as well as the possibility of having severe, recurrent pain from the stone. Also discussed that unfortunately that due to surgeon availability we are unable to perform her surgery prior to Friday. We will need to attempt to control her symptoms which may mean remaining admitted until surgery.     Update 04/21/23:  Still struggling with Nausea, but overall HDS and NAEON.UC still too young to read, will plan to start empiric abx today.     Recommendations:  1. Multi-modal pain control for stent discomfort. Currently receiving scheduled Tylenol, lidocaine patch, toradol scheduled, ditropan bid and gabapentin. Recommend increasing ditropan to 5 mg tid along with adding Flomax daily and pyridium tid. May also try applying heating pad to R flank.   2. Start cefdinir this AM  3. Please make pt NPO @ [facility] tonight  4. Will plan for add on tomorrow at [facility] instead of transfer at main.     Dr. Guy Sandifer was available.     Thank you for this consult. Please contact the [facility] urology consult pager with any further questions/concerns.    History of Present Illness:     Reason for Consult:  n/v and worsening pain w/ ureteral stent     Brandi Flynn is seen in consultation for reasons noted above at the request of Axel Filler, MD on the Family Medicine [facility]) service.    This is a 22 yo patient with a history of hypothyroidism, MS, obesity and nephrolithiasis who is s/p R ureteral stent placement for a 6 mm proximal ureteral stone on 03/26/2023. She is scheduled for USE this Friday, 7/26 at [facility]. She presented to the [facility] ER yesterday for complaints of worsening pain, ongoing n/v and inability to tolerate po intake. This was her 6th ER visit since stent placement due to discomfort. Has been taking ditropan but now not able to tolerate po.     In the ER she was afebrile and HDS. Vital signs have remained stable. Labs were wnl. UA appeared consistent with having an indwelling ureteral stent.  Urine culture is pending. CT abd/pelvis was done which showed migration of proximal end of stent into proximal ureter and 6 mm stone in renal pelvis with mild hydronephrosis.     Today she denies any fever, chills or gross hematuria.       Past Medical History:  Past Medical History:   Diagnosis Date    Disease of thyroid gland     Multiple sclerosis (CMS-HCC)     Obesity     Vertigo        Past Surgical History:   Past Surgical History:   Procedure Laterality Date    PR CYSTOSCOPY,INSERT URETERAL STENT Right 03/26/2023    Procedure: CYSTOURETHROSCOPY, WITH INSERTION OF INDWELLING URETERAL STENT (EG, GIBBONS OR DOUBLE-J TYPE);  Surgeon: Annye English, MD;  Location: MAIN OR [facility];  Service: Urology    PR CYSTOURETHROSCOPY,URETER CATHETER Right 03/26/2023    Procedure: CYSTOURETHROSCOPY, W/URETERAL CATHETERIZATION, W/WO IRRIG, INSTILL, OR URETEROPYELOG, EXCLUS OF RADIOLG SVC;  Surgeon: Annye English, MD;  Location: MAIN OR [facility];  Service: Urology       Medication:  Current Facility-Administered Medications   Medication Dose Route Frequency Provider Last Rate Last Admin    acetaminophen (TYLENOL) tablet 1,000 mg  1,000 mg Oral [facility] Bernette Redbird, MD   1,000 mg at 04/20/23 1610    carboxymethylcellulose sodium (REFRESH CELLUVISC) 1 % ophthalmic gel 1 drop  1 drop Both Eyes QID Bernette Redbird, MD   1 drop at 04/21/23 0503    cholecalciferol (vitamin D3 25 mcg (1,000 units)) tablet 25 mcg  25 mcg Oral Nightly Wilber Oliphant, MD   25 mcg at 04/20/23 2009    enoxaparin (LOVENOX) syringe 40 mg  40 mg Subcutaneous Q12H Repko, Alexander J, DO   40 mg at 04/19/23 1240    gabapentin (NEURONTIN) capsule 300 mg  300 mg Oral TID Aleen Campi, MD   300 mg at 04/20/23 2009    ketorolac (TORADOL) injection 15 mg  15 mg Intravenous Q6H PRN Kaiser, Dallas D, DO   15 mg at 04/20/23 1753    lidocaine 4 % patch 1 patch  1 patch Transdermal Daily Bernette Redbird, MD   1 patch at 04/19/23 0847    ondansetron (ZOFRAN) injection 4 mg  4 mg Intravenous Q6H PRN Syptak, Caitlyn M, MD        ondansetron (ZOFRAN-ODT) disintegrating tablet 4 mg  4 mg Oral Q6H PRN Syptak, Julien Girt, MD   4 mg at 04/21/23 0023    oxybutynin (DITROPAN) tablet 5 mg  5 mg Oral TID Syptak, Julien Girt, MD   5 mg at 04/20/23 2009    phenazopyridine (Pyridium) tablet 200 mg  200 mg Oral 3xd Meals Syptak, Julien Girt, MD           Allergies:  Allergies   Allergen Reactions    Blueberry Hives    Peanut Swelling     Swelling tongue       Social History:  Social History     Tobacco Use Smoking status: Every Day     Types: e-Cigarettes    Smokeless tobacco: Never    Tobacco comments:     Vapes   Vaping Use    Vaping status: Former    Substances: Nicotine   Substance Use Topics    Alcohol use: Not Currently    Drug use: Never       Family History  Family History   Problem Relation Age of  Onset    Thyroid disease Mother     Thyroid disease Maternal Grandmother     Thyroid disease Maternal Grandfather     Thyroid disease Paternal Grandmother     Vitiligo Paternal Grandmother        Review of Systems  10 systems were reviewed and are negative except as noted specifically in the HPI.    Objective:     Vital signs in last 24 hours:  BP 150/93  - Pulse 89  - Temp 37.1 ??C (98.8 ??F) (Oral)  - Resp 16  - Ht 160 cm (5' 3)  - Wt (!) 140.5 kg (309 lb 11.2 oz)  - SpO2 100%  - BMI 54.86 kg/m??     Intake/Output last 3 shifts:  I/O last 3 completed shifts:  In: 480 [P.O.:480]  Out: 350 [Urine:350]    Physical Exam  General: NAD, A&O, resting, appropriate  HEENT: [place]/AT, EOMI   Pulmonary: Normal work of breathing on RA   Cardiovascular: Regular rate & rhythm, HDS, adequate peripheral perfusion  Abdomen: soft, NTTP, nondistended,    GU: voiding spontaneously  Extremities: warm and well perfused, no edema  Neuro: Appropriate, no focal neurological deficits    Most Recent Labs:  Lab Results   Component Value Date    WBC 9.4 04/19/2023    HGB 13.5 04/19/2023    HCT 40.1 04/19/2023    PLT 222 04/19/2023       Lab Results   Component Value Date    NA 142 04/20/2023    K 4.1 04/20/2023    CL 107 04/20/2023    CO2 28.3 04/20/2023    BUN 18 04/20/2023    CREATININE 0.80 04/20/2023    CALCIUM 9.5 04/20/2023    MG 1.7 03/26/2023    PHOS 2.2 (L) 03/26/2023       Lab Results   Component Value Date    ALKPHOS 53 04/18/2023    BILITOT 0.6 04/18/2023    BILIDIR 0.20 03/17/2023    PROT 7.3 04/18/2023    ALBUMIN 3.7 04/18/2023    ALT 42 04/18/2023    AST 32 04/18/2023       No results found for: PT, INR, APTT      Urine Culture:  Recent Labs     Units 04/18/23  2115   LABURIN  TOO YOUNG TO READ          IMAGING:  No results found.

## 2023-04-22 DIAGNOSIS — N200 Calculus of kidney: Principal | ICD-10-CM

## 2023-04-22 LAB — BASIC METABOLIC PANEL
ANION GAP: 6 mmol/L (ref 5–14)
BLOOD UREA NITROGEN: 13 mg/dL (ref 9–23)
BUN / CREAT RATIO: 20
CALCIUM: 9.6 mg/dL (ref 8.7–10.4)
CHLORIDE: 107 mmol/L (ref 98–107)
CO2: 27.5 mmol/L (ref 20.0–31.0)
CREATININE: 0.64 mg/dL
EGFR CKD-EPI (2021) FEMALE: >90 mL/min/{1.73_m2} (ref >=60)
GLUCOSE RANDOM: 86 mg/dL (ref 70–179)
POTASSIUM: 4.1 mmol/L (ref 3.4–4.8)
SODIUM: 140 mmol/L (ref 135–145)

## 2023-04-22 MED ORDER — TAMSULOSIN 0.4 MG CAPSULE
ORAL_CAPSULE | Freq: Every day | ORAL | 0 refills | 30 days
Start: 2023-04-22 — End: 2023-05-22

## 2023-04-22 MED ORDER — KETOROLAC 10 MG TABLET
ORAL_TABLET | Freq: Four times a day (QID) | ORAL | 0 refills | 5 days | PRN
Start: 2023-04-22 — End: 2023-04-27

## 2023-04-22 MED ORDER — PHENAZOPYRIDINE 100 MG TABLET
ORAL_TABLET | Freq: Three times a day (TID) | ORAL | 0 refills | 4 days | PRN
Start: 2023-04-22 — End: 2023-04-29

## 2023-04-22 MED ORDER — ACETAMINOPHEN 500 MG TABLET
ORAL_TABLET | Freq: Three times a day (TID) | ORAL | 0 refills | 7 days
Start: 2023-04-22 — End: 2023-04-29

## 2023-04-22 MED ORDER — CIPROFLOXACIN 500 MG TABLET
ORAL_TABLET | Freq: Once | ORAL | 0 refills | 1 days
Start: 2023-04-22 — End: 2023-04-22

## 2023-04-22 MED ADMIN — Propofol (DIPRIVAN) injection: INTRAVENOUS | @ 19:00:00 | Stop: 2023-04-22 | NDC 72572061210

## 2023-04-22 MED ADMIN — oxybutynin (DITROPAN) tablet 5 mg: 5 mg | ORAL | @ 12:00:00 | NDC 64376040216

## 2023-04-22 MED ADMIN — oxybutynin (DITROPAN) tablet 5 mg: 5 mg | ORAL | NDC 64376040216

## 2023-04-22 MED ADMIN — cholecalciferol (vitamin D3 25 mcg (1,000 units)) tablet 25 mcg: 25 ug | ORAL | Stop: 2023-04-24 | NDC 53191040901

## 2023-04-22 MED ADMIN — ondansetron (ZOFRAN) injection: INTRAVENOUS | @ 19:00:00 | Stop: 2023-04-22 | NDC 62756018101

## 2023-04-22 MED ADMIN — sterile water irrigation solution: @ 19:00:00 | Stop: 2023-04-22 | NDC 78742035191

## 2023-04-22 MED ADMIN — phenylephrine 1 mg/10 mL (100 mcg/mL) injection Syrg: INTRAVENOUS | @ 19:00:00 | Stop: 2023-04-22 | NDC 55081114500

## 2023-04-22 MED ADMIN — acetaminophen (TYLENOL) tablet 1,000 mg: 1000 mg | ORAL | @ 20:00:00 | Stop: 2023-04-22 | NDC 53191040901

## 2023-04-22 MED ADMIN — sugammadex (BRIDION) injection: INTRAVENOUS | @ 20:00:00 | Stop: 2023-04-22 | NDC 00006542312

## 2023-04-22 MED ADMIN — ketorolac (TORADOL) injection 15 mg: 15 mg | INTRAVENOUS | @ 15:00:00 | Stop: 2023-04-22 | NDC 00409228831

## 2023-04-22 MED ADMIN — carboxymethylcellulose sodium (REFRESH CELLUVISC) 1 % ophthalmic gel 1 drop: 1 [drp] | OPHTHALMIC | NDC 70000008901

## 2023-04-22 MED ADMIN — fentaNYL (PF) (SUBLIMAZE) injection: INTRAVENOUS | @ 19:00:00 | Stop: 2023-04-22 | NDC 50458002005

## 2023-04-22 MED ADMIN — lactated Ringers infusion: INTRAVENOUS | @ 19:00:00 | Stop: 2023-04-22 | NDC 11845118709

## 2023-04-22 MED ADMIN — succinylcholine (ANECTINE) injection: INTRAVENOUS | @ 19:00:00 | Stop: 2023-04-22 | NDC 75834015125

## 2023-04-22 MED ADMIN — ceFAZolin (ANCEF) injection: INTRAVENOUS | @ 19:00:00 | Stop: 2023-04-22 | NDC 72572005525

## 2023-04-22 MED ADMIN — gabapentin (NEURONTIN) capsule 300 mg: 300 mg | ORAL | @ 12:00:00 | NDC 76420004790

## 2023-04-22 MED ADMIN — gabapentin (NEURONTIN) capsule 300 mg: 300 mg | ORAL | NDC 76420004790

## 2023-04-22 MED ADMIN — carboxymethylcellulose sodium (REFRESH CELLUVISC) 1 % ophthalmic gel 1 drop: 1 [drp] | OPHTHALMIC | @ 10:00:00 | NDC 70000008901

## 2023-04-22 MED ADMIN — haloperidol LACTATE (HALDOL) injection: INTRAVENOUS | @ 19:00:00 | Stop: 2023-04-22 | NDC 70069003003

## 2023-04-22 MED ADMIN — ROCuronium (ZEMURON) injection: INTRAVENOUS | @ 20:00:00 | Stop: 2023-04-22 | NDC 81565020402

## 2023-04-22 MED ADMIN — ROCuronium (ZEMURON) injection: INTRAVENOUS | @ 19:00:00 | Stop: 2023-04-22 | NDC 81565020402

## 2023-04-22 MED ADMIN — acetaminophen (TYLENOL) tablet 1,000 mg: 1000 mg | ORAL | @ 03:00:00 | NDC 53191040901

## 2023-04-22 MED ADMIN — ketorolac (TORADOL) injection 15 mg: 15 mg | INTRAVENOUS | @ 03:00:00 | Stop: 2023-04-21 | NDC 00409228831

## 2023-04-22 MED ADMIN — ondansetron (ZOFRAN-ODT) disintegrating tablet 4 mg: 4 mg | ORAL | NDC 71930001752

## 2023-04-22 MED ADMIN — lidocaine (PF) (XYLOCAINE-MPF) 20 mg/mL (2 %) injection: INTRAVENOUS | @ 19:00:00 | Stop: 2023-04-22 | NDC 70756064887

## 2023-04-22 MED ADMIN — acetaminophen (TYLENOL) tablet 1,000 mg: 1000 mg | ORAL | @ 12:00:00 | NDC 53191040901

## 2023-04-22 MED ADMIN — dexAMETHasone (DECADRON) 4 mg/mL injection: INTRAVENOUS | @ 19:00:00 | Stop: 2023-04-22 | NDC 00779755160

## 2023-04-22 NOTE — Unmapped (Signed)
New Urology Consult Note      Requesting Attending Physician:  Axel Filler, MD  Service Requesting Consult:  Family Medicine [facility])  Service Providing Consult: [facility]  Consulting Attending: Dr. Guy Sandifer    Assessment:    Patient is a 22 y.o. female with history of hypothyroidism, MS, obesity and nephrolithiasis who is s/p R ureteral stent placement for a 6 mm proximal ureteral stone on 03/26/2023. She is scheduled for USE this Friday, 7/26 at [facility]. She presented to the [facility] ER yesterday for complaints of worsening pain, ongoing n/v and inability to tolerate po intake.     n the ER she was afebrile and HDS. Vital signs have remained stable. Labs were wnl. UA appeared consistent with having an indwelling ureteral stent. Urine culture is pending. CT abd/pelvis was done which showed migration of proximal end of stent into proximal ureter and 6 mm stone in renal pelvis with mild hydronephrosis.     Discussed with patient that her CT scan shows mild migration of ureteral stent and new mild hydronephrosis. This can cause some increased discomfort. However, the hydronephrosis is most likely due to reflux of urine with stent in place and not signs of obstruction.     Discussed with patient that we do not recommend removing her ureteral stent prior to surgery due to possibility of introduction of bacteria into her urinary tract resulting in an infection which could delay her surgery as well as the possibility of having severe, recurrent pain from the stone. Also discussed that unfortunately that due to surgeon availability we are unable to perform her surgery prior to Friday. We will need to attempt to control her symptoms which may mean remaining admitted until surgery.     Update 04/22/23:  Pt made NPO @ [facility] for add on stent today.   Still with ongoing pain issues.   HDS, NAEON.  Consent obtained at bedside this AM.     Recommendations:  1. To OR today for stone removal  2. Please keep PT NPO  3.  UC with MUG - no pre abx required at this time    Dr. Guy Sandifer was available.     Thank you for this consult. Please contact the [facility] urology consult pager with any further questions/concerns.    History of Present Illness:     Reason for Consult:  n/v and worsening pain w/ ureteral stent     Brandi Flynn is seen in consultation for reasons noted above at the request of Axel Filler, MD on the Family Medicine [facility]) service.    This is a 22 yo patient with a history of hypothyroidism, MS, obesity and nephrolithiasis who is s/p R ureteral stent placement for a 6 mm proximal ureteral stone on 03/26/2023. She is scheduled for USE this Friday, 7/26 at [facility]. She presented to the [facility] ER yesterday for complaints of worsening pain, ongoing n/v and inability to tolerate po intake. This was her 6th ER visit since stent placement due to discomfort. Has been taking ditropan but now not able to tolerate po.     In the ER she was afebrile and HDS. Vital signs have remained stable. Labs were wnl. UA appeared consistent with having an indwelling ureteral stent. Urine culture is pending. CT abd/pelvis was done which showed migration of proximal end of stent into proximal ureter and 6 mm stone in renal pelvis with mild hydronephrosis.     Today she denies any fever, chills or gross  hematuria.       Past Medical History:  Past Medical History:   Diagnosis Date    Disease of thyroid gland     Multiple sclerosis (CMS-HCC)     Obesity     Vertigo        Past Surgical History:   Past Surgical History:   Procedure Laterality Date    PR CYSTOSCOPY,INSERT URETERAL STENT Right 03/26/2023    Procedure: CYSTOURETHROSCOPY,  WITH INSERTION OF INDWELLING URETERAL STENT (EG, GIBBONS OR DOUBLE-J TYPE);  Surgeon: Annye English, MD;  Location: MAIN OR [facility];  Service: Urology    PR CYSTOURETHROSCOPY,URETER CATHETER Right 03/26/2023    Procedure: CYSTOURETHROSCOPY, W/URETERAL CATHETERIZATION, W/WO IRRIG, INSTILL, OR URETEROPYELOG, EXCLUS OF RADIOLG SVC;  Surgeon: Annye English, MD;  Location: MAIN OR [facility];  Service: Urology       Medication:  Current Facility-Administered Medications   Medication Dose Route Frequency Provider Last Rate Last Admin    acetaminophen (TYLENOL) tablet 1,000 mg  1,000 mg Oral [facility] Bernette Redbird, MD   1,000 mg at 04/21/23 2302    carboxymethylcellulose sodium (REFRESH CELLUVISC) 1 % ophthalmic gel 1 drop  1 drop Both Eyes QID Bernette Redbird, MD   1 drop at 04/22/23 0981    cholecalciferol (vitamin D3 25 mcg (1,000 units)) tablet 25 mcg  25 mcg Oral Nightly Wilber Oliphant, MD   25 mcg at 04/21/23 2009    [Provider Hold] enoxaparin (LOVENOX) syringe 40 mg  40 mg Subcutaneous Q12H Repko, Alexander J, DO   40 mg at 04/19/23 1240    gabapentin (NEURONTIN) capsule 300 mg  300 mg Oral TID Syptak, Julien Girt, MD   300 mg at 04/21/23 2009    lidocaine 4 % patch 1 patch  1 patch Transdermal Daily Bernette Redbird, MD   1 patch at 04/19/23 0847    ondansetron (ZOFRAN) injection 4 mg  4 mg Intravenous Q6H PRN Syptak, Caitlyn M, MD        ondansetron (ZOFRAN-ODT) disintegrating tablet 4 mg  4 mg Oral Q6H PRN Syptak, Julien Girt, MD   4 mg at 04/21/23 2010    oxybutynin (DITROPAN) tablet 5 mg  5 mg Oral TID Syptak, Julien Girt, MD   5 mg at 04/21/23 2010    phenazopyridine (Pyridium) tablet 200 mg  200 mg Oral 3xd Meals Syptak, Julien Girt, MD           Allergies:  Allergies   Allergen Reactions    Blueberry Hives    Peanut Swelling     Swelling tongue       Social History:  Social History     Tobacco Use    Smoking status: Every Day     Types: e-Cigarettes    Smokeless tobacco: Never    Tobacco comments:     Vapes   Vaping Use    Vaping status: Former    Substances: Nicotine   Substance Use Topics    Alcohol use: Not Currently    Drug use: Never       Family History  Family History   Problem Relation Age of Onset    Thyroid disease Mother     Thyroid disease Maternal Grandmother     Thyroid disease Maternal Grandfather     Thyroid disease Paternal Grandmother     Vitiligo Paternal Grandmother        Review of Systems  10 systems were reviewed and are negative except as noted specifically  in the HPI.    Objective:     Vital signs in last 24 hours:  BP 138/94  - Pulse 74  - Temp 36.7 ??C (98.1 ??F) (Oral)  - Resp 18  - Ht 160 cm (5' 3)  - Wt (!) 140.5 kg (309 lb 11.2 oz)  - SpO2 100%  - BMI 54.86 kg/m??     Intake/Output last 3 shifts:  I/O last 3 completed shifts:  In: -   Out: 350 [Urine:350]    Physical Exam  General: NAD, A&O, resting, appropriate  HEENT: [place]/AT, EOMI   Pulmonary: Normal work of breathing on RA   Cardiovascular: Regular rate & rhythm, HDS, adequate peripheral perfusion  Abdomen: soft, NTTP, nondistended,    GU: voiding spontaneously  Extremities: warm and well perfused, no edema  Neuro: Appropriate, no focal neurological deficits    Most Recent Labs:  Lab Results   Component Value Date    WBC 9.4 04/19/2023    HGB 13.5 04/19/2023    HCT 40.1 04/19/2023    PLT 222 04/19/2023       Lab Results   Component Value Date    NA 142 04/21/2023    K 4.2 04/21/2023    CL 106 04/21/2023    CO2 28.6 04/21/2023    BUN 12 04/21/2023    CREATININE 0.65 04/21/2023    CALCIUM 9.4 04/21/2023    MG 1.7 03/26/2023    PHOS 2.2 (L) 03/26/2023       Lab Results   Component Value Date    ALKPHOS 53 04/18/2023    BILITOT 0.6 04/18/2023    BILIDIR 0.20 03/17/2023    PROT 7.3 04/18/2023    ALBUMIN 3.7 04/18/2023    ALT 42 04/18/2023    AST 32 04/18/2023       No results found for: PT, INR, APTT      Urine Culture:  Recent Labs     Units 04/18/23  2115   LABURIN  Mixed Urogenital Flora          IMAGING:  No results found.

## 2023-04-22 NOTE — Unmapped (Addendum)
Patient is alert and oriented  Has been NPO for surgery  1 time ordered toradol was given this AM  Boyfriend has been at bedside     Pt came back from surgery around 1700, she requested purewick because getting up causes dizziness and pain   She is on 2L [place]     Problem: Adult Inpatient Plan of Care  Goal: Plan of Care Review  Outcome: Ongoing - Unchanged  Goal: Patient-Specific Goal (Individualized)  Outcome: Ongoing - Unchanged  Goal: Absence of Hospital-Acquired Illness or Injury  Outcome: Ongoing - Unchanged  Intervention: Identify and Manage Fall Risk  Recent Flowsheet Documentation  Taken 04/22/2023 1000 by Cheri Kearns, RN  Safety Interventions: fall reduction program maintained  Taken 04/22/2023 0800 by Cheri Kearns, RN  Safety Interventions: fall reduction program maintained  Goal: Optimal Comfort and Wellbeing  Outcome: Ongoing - Unchanged  Goal: Readiness for Transition of Care  Outcome: Ongoing - Unchanged  Goal: Rounds/Family Conference  Outcome: Ongoing - Unchanged     Problem: Infection  Goal: Absence of Infection Signs and Symptoms  Outcome: Ongoing - Unchanged     Problem: Pain Acute  Goal: Optimal Pain Control and Function  Outcome: Ongoing - Unchanged     Problem: Nausea and Vomiting  Goal: Nausea and Vomiting Relief  Outcome: Ongoing - Unchanged     Problem: Fall Injury Risk  Goal: Absence of Fall and Fall-Related Injury  Outcome: Ongoing - Unchanged  Intervention: Promote Injury-Free Environment  Recent Flowsheet Documentation  Taken 04/22/2023 1000 by Cheri Kearns, RN  Safety Interventions: fall reduction program maintained  Taken 04/22/2023 0800 by Cheri Kearns, RN  Safety Interventions: fall reduction program maintained

## 2023-04-22 NOTE — Unmapped (Signed)
UROLOGY FOLLOW UP REQUEST    Requesting physician/contact for questions: Roby Lofts, MD      Date(s) Needed: follow-up in 6 weeks    Appt to be scheduled under: APPs or any available provider     Type of Appt: Post-op s/p USE    Need to contact patient: Yes please    Special Requests/Instructions: [facility] prior    Orders placed for Special Requests: Yes      Thank you so much for your help!    Best,  Chanetta Marshall

## 2023-04-22 NOTE — Unmapped (Signed)
Family Medicine Inpatient Service  Progress Note    Team: Family Medicine [place] [facility] (pgr [phone])  PCP: [name], FNP  Date of Admission: April 18, 2023  Code Status: full code  Emergency Contact: Dellie Burns [phone])    Hospital Day: 3    ASSESSMENT / PLAN:   Brandi Flynn is a 22 y.o. female with a past medical history significant for MS and nephrolithiasis (s/p R ureteral stent placed 03/26/23) who presents with persistent flank pain, dysuria, and worsening nausea/vomiting/diarrhea, inability to tolerate PO.    # Renal Colic 2/2 R nephrolithiasis s/p ureteral stent   Patient has had six hospitalizations for flank pain following R ureteral stent placement on 6/29, with CT scan on 7/22 showing migration of both R ureteral stent and calculus, likely causing her increased nausea, vomiting, and flank pain over the last few days. Urology has been consulted and is following, with pain recommendations described below. Lithotripsy and stent removal scheduled for today at [facility] (7/26). She remains afebrile but continues to endorse flank pain, with one episode of vomiting following dinner on 7/25 PM. Urine cultures from 7/22 returned normal GU flora on 7/26 so discontinued cefdinir. Overall, symptoms are likely 2/2 stent discomfort and nephrolithiasis, less likely due to pyelonephritis/UTI. Pain management is the primary goal for the patient, and she would like to avoid opioids given family history of OUD. The current pain regimen appears to mostly be covering her pain, with increased ditropan frequency to TID and continued gabapentin 300mg  TID with toradol 15mg  q6hr PRN. Plan is to continue her current pain regimen, as described below, and keep her inpatient following recovery for her procedure unless she is ready for discharge post-procedure.  Urology consulted and is following, appreciate recs  Discontinue cefdinir 300mg  BID (7/25-7/26)   Pain regimen:   Scheduled PO Tylenol 1g q8hr, Gabapentin 300 mg TID, Oxybutynin 5mg  PO TID  As needed IV toradol 15 mg q6hr, lidocaine patch, ice pack and heating pad  Plan to keep admitted until surgery, scheduled as an add-on at [facility] on 7/26  Pending post-op status, either plan for same-day discharge or discharge on 7/27 AM     # Nausea/Vomiting - Resolved diarrhea  # Inability to tolerate PO  Patient has been experiencing nausea and vomiting over past few weeks, with now resolved diarrhea. She has had decreased PO intake lately due to inability to tolerate PO without vomiting. S/p 1L IVF in ED. EKG on admission showed NSR, QTc 453 ms. Viral gastroenteritis and C. Diff workup both negative 7/23. She has progressively been tolerating solids and liquids better following Zofran administration, but still endorses occasional vomiting. She is now aware of her Zofran PRN schedule and is planning to schedule her meals around the time she receives her Zofran. Encouraged increased water intake throughout the day and meals as tolerable. Will continue to monitor PO toleration.  Zofran 4mg  ODT PRN q6h, if unable to tolerate PO has Zofran 4mg  IV q6hr PRN  Consider repeat EKG if using often  Advance diet as tolerated  Encourage PO hydration, if unable to tolerate then restart IV rehydration therapy    # Multiple Sclerosis  Reports worsening blurriness and leg pain, similar symptoms that she felt before her first flare (and subsequent diagnosis of MS) in April 2024. Continue to monitor and advise outpatient follow-up after surgery. Encourage ambulation as tolerated.  Refresh celluvisc 1% 1 drop to each eye QID    # Headache  Has been taking high doses of  tylenol for the past 2.5 weeks and describes waking up with headaches that are unremitting until she is about to go to bed. She noted some photophobia as well. Likely due to recent poor PO intake and increased analgesic use. Encouraged increased hydration and hopeful weaning off of tylenol following 7/26 procedure. She denies any headache on 7/25 AM, which is a notable improvement from baseline.  Will continue to monitor    # Checklist:  - IVF: None  - Daily labs needed: BMP  - Diet: Regular  - Bowel Regimen: No indication for a bowel regimen at this time  - DVT: Hold SQ Lovenox given impending procedure  - Code Status:   Orders Placed This Encounter   Procedures    Full Code     Standing Status:   Standing     Number of Occurrences:   1     - Dispo: Floor  [ ]  Anticipated Discharge Location: Home  [ ]  PT/OT/DME: No needs anticipated  [ ]  CM/SW needs: None anticipated  [ ]  Follow up appt: Has follow up appointment with Urology following lithotripsy and stent removal on 7/26    SUBJECTIVE:  Interval events:   Slept decently overnight but continues to endorse flank pain. She had another episode of vomiting following dinner last night, as her Zofran dosing was off schedule to line up with her eating schedule. Discussed surgery plan for today and is nervous but ready to have it done.    REVIEW OF SYSTEMS:  Pertinent positives and negatives per HPI. A complete review of systems otherwise negative.    PHYSICAL EXAM:  Temp:  [36.6 ??C (97.9 ??F)-37 ??C (98.6 ??F)] 37 ??C (98.6 ??F)  Heart Rate:  [74-80] 80  Resp:  [15-20] 20  BP: (128-154)/(77-94) 128/86  SpO2:  [96 %-100 %] 97 %  No intake or output data in the 24 hours ending 04/22/23 1343    GEN: sitting up in bed, NAD, described continued pain in her R flank, had some questions regarding surgery and the plan for the day  HEENT: NCAT, No scleral icterus. Conjunctiva non-erythematous. MMM.  CV: Regular rate and rhythm. No murmurs/rubs/gallops.  Pulm: Normal work of breathing. CTAB. No wheezing, crackles, or rhonchi.  Abd: TTP in right upper and lower quadrants. No guarding, rebound. Normoactive bowel sounds. R >>> L CVA tenderness.   Neuro: A&O x 3. No focal deficits.  Ext: No peripheral edema. Palpable distal pulses.  Skin: No rashes or skin lesions.     LABS/ STUDIES:    All imaging, laboratory studies, and other pertinent tests including electrocardiography within the last 24 hours were reviewed and are summarized within the assessment and plan.     Lilla Shook, MS3 04/22/2023     I attest that I have reviewed the student note and that the components of the history of the present illness, the physical exam, and the assessment and plan documented were performed by me or were performed in my presence by the student where I verified the documentation and performed (or re-performed) the exam and medical decision making.     Aurelio Jew MD PGY2

## 2023-04-22 NOTE — Unmapped (Signed)
I, Nancie Neas, was present for the entire procedure, participating/scrubbed for all critical portions of the procedure.      Urologic Surgery Operative Note    Date of Surgery: 04/22/23    Preoperative Diagnosis: right ureteral stone    Postoperative Diagnosis:  Same    Procedure(s):  Right - CYSTOURETHROSCOPY, WITH URETEROSCOPY &/OR PYELOSCOPY; WITH LITHOTRIPSY (URETERAL CATHETERIZATION INCLUDED)  - Cystourethroscopy  - Right ureteroscopic stone extraction with laser lithotripsy  - Right ureteral stent removal  - Right ureteral stent placement  - Right retrograde pyelogram  - Intraoperative fluoroscopy <1 hr    Performing Service: Urology  Surgeons and Role:     * Xylia Scherger, Sharren Bridge, MD - Primary     * Roby Lofts, MD - Resident - Assisting    Assistant(s):  None     Anesthesia: General    Fluids:  See anesthesia record    Estimated blood loss:  0 mL    Complications: None    Specimens:   ID Type Source Tests Collected by Time Destination   A :  Renal Calculi Ureter, Right STONE ANALYSIS Turner Daniels, MD 04/22/2023 1545        Drains:    Implant Name Type Inv. Item Serial No. Manufacturer Lot No. LRB No. Used Action   Stnt Ureteral 16frx24cm Contour - [id number] Stent Stnt Ureteral 19frx24cm Contour  [employer] [id number] Right 1 Explanted       Indications: 22 y.o. female with 6 mm right renal stone s/p stenting, presenting for definitive USE. Risks, benefits, and alternatives of the above procedure were discussed and informed consent was signed.    Operative Findings:    - urethra normal. bladder grossly normal wtih no lesions, masses, or stones noted. Orthotopic ureteral orifices. Right stent in place with no encrustation  -12/14 ureteral sheath utilized, urethra widely patent.  -6 mm stone in upper pole of kidney, dusted with holmium laser at 0.6x6 settings  -6Fx24 cm ureteral stent with string placed    Fluoroscopic Findings: Post-treatment pullout retrograde pyelogram revealed no significant remaining stone burden, and no filling defects or contrast extravasation. KUB after stent placement revealed appropriate curls proximally and distally.     Description:  The patient was correctly identified in the preop holding area where written informed consent as well potential risk and complication reviewed. She agreed. The patient was brought to the operative suite where a preinduction timeout was performed. Once correct information was verified, general anesthesia was induced. The patient was then gently placed into dorsal lithotomy position with SCDs in place for VTE prophylaxis. They were prepped and draped in the usual sterile fashion and given appropriate preoperative antibiotics with cefazolin. A second timeout was then performed.     We inserted a 63F rigid cystoscope per urethra with copious lubrication and normal saline irrigation running. We performed cystourethroscopy, which revealed the above findings.    We grasped our ureteral stent and pulled to urethral meatus.  We then cannulated this with a sensor wire which was placed to renal pelvis. Stent was removed fully intact.  24F open ended placed over sensor wire and RPG shot, showing appropriate caliber ureter, no stone noted.  12x14 x 36 cm ureteral sheath placed with minimal resistance over sensor wire.   Ureteroscopy then performed showing known 6 mm stone.   Stone was fragmented using Holmium laser at 0.6x6 settings. All fragments basketed and removed.   Retrograde pullout and RPG showing no remaining stone burden, no  contrast extravasation.   6Fx24 cm ureteral stent placed under fluoroscopy with appropriate curl noted in kidney and bladder.   Bladder emptied with rigid scope, and ended case.     The patient was awoken from general anesthesia having tolerated the procedure well and taken to the PACU for routine post-operative recovery.    Post-Op Plan:    - Discharge patient to the floor when meets PACU criteria.  - Patient will remove stent at home in 5 days, and take one dose of antibiotics at that time.  - Pt will follow-up in 6 weeks with RUS prior   - discharge instructions and medications pended in discharge summary for primary team  - okay to discharge per primary team     Roby Lofts, MD, MD  Date: 04/22/2023  Time: 4:08 PM

## 2023-04-22 NOTE — Unmapped (Signed)
Pt Aox4, NAD,VSS. Off note she is NPO @ [facility]  for lithotripsy this Friday, 7/26. Tolerated medications. PRN's administered for Pain and Nausea. Family at bedside.  Problem: Adult Inpatient Plan of Care  Goal: Plan of Care Review  Outcome: Progressing  Goal: Patient-Specific Goal (Individualized)  Outcome: Progressing  Goal: Absence of Hospital-Acquired Illness or Injury  Outcome: Progressing  Intervention: Identify and Manage Fall Risk  Recent Flowsheet Documentation  Taken 04/21/2023 1930 by Nani Gasser, RN  Safety Interventions: low bed  Goal: Optimal Comfort and Wellbeing  Outcome: Progressing  Goal: Readiness for Transition of Care  Outcome: Progressing  Goal: Rounds/Family Conference  Outcome: Progressing     Problem: Infection  Goal: Absence of Infection Signs and Symptoms  Outcome: Progressing     Problem: Pain Acute  Goal: Optimal Pain Control and Function  Outcome: Progressing     Problem: Nausea and Vomiting  Goal: Nausea and Vomiting Relief  Outcome: Progressing     Problem: Fall Injury Risk  Goal: Absence of Fall and Fall-Related Injury  Outcome: Progressing  Intervention: Promote Injury-Free Environment  Recent Flowsheet Documentation  Taken 04/21/2023 1930 by Nani Gasser, RN  Safety Interventions: low bed

## 2023-04-22 NOTE — Unmapped (Incomplete Revision)
Urologic Surgery Operative Note    Date of Surgery: 04/22/23    Preoperative Diagnosis: right ureteral stone    Postoperative Diagnosis:  Same    Procedure(s):  Right - CYSTOURETHROSCOPY, WITH URETEROSCOPY &/OR PYELOSCOPY; WITH LITHOTRIPSY (URETERAL CATHETERIZATION INCLUDED)  - Cystourethroscopy  - Right ureteroscopic stone extraction with laser lithotripsy  - Right ureteral stent removal  - Right ureteral stent placement  - Right retrograde pyelogram  - Intraoperative fluoroscopy <1 hr    Performing Service: Urology  Surgeons and Role:     * Figler, Sharren Bridge, MD - Primary     * Roby Lofts, MD - Resident - Assisting    Assistant(s):  None     Anesthesia: General    Fluids:  See anesthesia record    Estimated blood loss:  0 mL    Complications: None    Specimens:   ID Type Source Tests Collected by Time Destination   A :  Renal Calculi Ureter, Right STONE ANALYSIS Turner Daniels, MD 04/22/2023 1545        Drains:    Implant Name Type Inv. Item Serial No. Manufacturer Lot No. LRB No. Used Action   Stnt Ureteral 16frx24cm Contour - [id number] Stent Stnt Ureteral 75frx24cm Contour  [employer] [id number] Right 1 Explanted       Indications: 22 y.o. female with 6 mm right renal stone s/p stenting, presenting for definitive USE. Risks, benefits, and alternatives of the above procedure were discussed and informed consent was signed.    Operative Findings:    - urethra normal. bladder grossly normal wtih no lesions, masses, or stones noted. Orthotopic ureteral orifices. Right stent in place with no encrustation  -12/14 ureteral sheath utilized, urethra widely patent.  -6 mm stone in upper pole of kidney, dusted with holmium laser at 0.6x6 settings  -6Fx24 cm ureteral stent with string placed    Fluoroscopic Findings: Post-treatment pullout retrograde pyelogram revealed no significant remaining stone burden, and no filling defects or contrast extravasation. KUB after stent placement revealed appropriate curls proximally and distally.     Description:  The patient was correctly identified in the preop holding area where written informed consent as well potential risk and complication reviewed. She agreed. The patient was brought to the operative suite where a preinduction timeout was performed. Once correct information was verified, general anesthesia was induced. The patient was then gently placed into dorsal lithotomy position with SCDs in place for VTE prophylaxis. They were prepped and draped in the usual sterile fashion and given appropriate preoperative antibiotics with cefazolin. A second timeout was then performed.     We inserted a 24F rigid cystoscope per urethra with copious lubrication and normal saline irrigation running. We performed cystourethroscopy, which revealed the above findings.    We grasped our ureteral stent and pulled to urethral meatus.  We then cannulated this with a sensor wire which was placed to renal pelvis. Stent was removed fully intact.  87F open ended placed over sensor wire and RPG shot, showing appropriate caliber ureter, no stone noted.  12x14 x 36 cm ureteral sheath placed with minimal resistance over sensor wire.   Ureteroscopy then performed showing known 6 mm stone.   Stone was fragmented using Holmium laser at 0.6x6 settings. All fragments basketed and removed.   Retrograde pullout and RPG showing no remaining stone burden, no contrast extravasation.   6Fx24 cm ureteral stent placed under fluoroscopy with appropriate curl noted in kidney and bladder.   Bladder  emptied with rigid scope, and ended case.     The patient was awoken from general anesthesia having tolerated the procedure well and taken to the PACU for routine post-operative recovery.    Post-Op Plan:    - Discharge patient to the floor when meets PACU criteria.  - Patient will remove stent at home in 5 days, and take one dose of antibiotics at that time.  - Pt will follow-up in 6 weeks with [facility] prior - discharge instructions and medications pended in discharge summary for primary team  - okay to discharge per primary team     Roby Lofts, MD, MD  Date: 04/22/2023  Time: 4:08 PM

## 2023-04-23 MED ORDER — OXYBUTYNIN CHLORIDE 5 MG TABLET
ORAL_TABLET | Freq: Three times a day (TID) | ORAL | 11 refills | 30 days | Status: CP
Start: 2023-04-23 — End: 2024-04-22
  Filled 2023-04-23: qty 90, 30d supply, fill #0

## 2023-04-23 MED ORDER — PHENAZOPYRIDINE 100 MG TABLET
ORAL_TABLET | Freq: Three times a day (TID) | ORAL | 0 refills | 4 days | Status: CP | PRN
Start: 2023-04-23 — End: 2023-04-30
  Filled 2023-04-23: qty 21, 4d supply, fill #0

## 2023-04-23 MED ORDER — ACETAMINOPHEN 500 MG TABLET
ORAL_TABLET | Freq: Three times a day (TID) | ORAL | 0 refills | 7 days | Status: CP
Start: 2023-04-23 — End: 2023-04-30
  Filled 2023-04-23: qty 42, 7d supply, fill #0

## 2023-04-23 MED ORDER — KETOROLAC 10 MG TABLET
ORAL_TABLET | Freq: Four times a day (QID) | ORAL | 0 refills | 5 days | Status: CP | PRN
Start: 2023-04-23 — End: 2023-04-28
  Filled 2023-04-23: qty 20, 5d supply, fill #0

## 2023-04-23 MED ORDER — CIPROFLOXACIN 500 MG TABLET
ORAL_TABLET | Freq: Once | ORAL | 0 refills | 1 days | Status: CP
Start: 2023-04-23 — End: 2023-04-24
  Filled 2023-04-23: qty 1, 1d supply, fill #0

## 2023-04-23 MED ADMIN — ketorolac (TORADOL) injection 15 mg: 15 mg | INTRAVENOUS | @ 13:00:00 | Stop: 2023-04-23 | NDC 00409228831

## 2023-04-23 MED ADMIN — ondansetron (ZOFRAN) injection 4 mg: 4 mg | INTRAVENOUS | @ 01:00:00 | NDC 71930001752

## 2023-04-23 MED ADMIN — gabapentin (NEURONTIN) capsule 300 mg: 300 mg | ORAL | @ 13:00:00 | Stop: 2023-04-23 | NDC 76420004790

## 2023-04-23 MED ADMIN — carboxymethylcellulose sodium (REFRESH CELLUVISC) 1 % ophthalmic gel 1 drop: 1 [drp] | OPHTHALMIC | @ 10:00:00 | Stop: 2023-04-23 | NDC 70000008901

## 2023-04-23 MED ADMIN — ketorolac (TORADOL) injection 15 mg: 15 mg | INTRAVENOUS | @ 01:00:00 | Stop: 2023-04-23 | NDC 00409228831

## 2023-04-23 MED ADMIN — oxybutynin (DITROPAN) tablet 5 mg: 5 mg | ORAL | @ 01:00:00 | NDC 64376040216

## 2023-04-23 MED ADMIN — cholecalciferol (vitamin D3 25 mcg (1,000 units)) tablet 25 mcg: 25 ug | ORAL | @ 01:00:00 | Stop: 2023-04-24 | NDC 53191040901

## 2023-04-23 MED ADMIN — ondansetron (ZOFRAN-ODT) disintegrating tablet 4 mg: 4 mg | ORAL | @ 10:00:00 | Stop: 2023-04-23 | NDC 71930001752

## 2023-04-23 MED ADMIN — oxybutynin (DITROPAN) tablet 5 mg: 5 mg | ORAL | @ 13:00:00 | Stop: 2023-04-23 | NDC 64376040216

## 2023-04-23 MED ADMIN — acetaminophen (TYLENOL) tablet 1,000 mg: 1000 mg | ORAL | @ 13:00:00 | Stop: 2023-04-23 | NDC 53191040901

## 2023-04-23 MED ADMIN — gabapentin (NEURONTIN) capsule 300 mg: 300 mg | ORAL | @ 01:00:00 | NDC 76420004790

## 2023-04-23 MED ADMIN — carboxymethylcellulose sodium (REFRESH CELLUVISC) 1 % ophthalmic gel 1 drop: 1 [drp] | OPHTHALMIC | @ 16:00:00 | Stop: 2023-04-23 | NDC 70000008901

## 2023-04-23 MED ADMIN — carboxymethylcellulose sodium (REFRESH CELLUVISC) 1 % ophthalmic gel 1 drop: 1 [drp] | OPHTHALMIC | @ 01:00:00 | NDC 70000008901

## 2023-04-23 NOTE — Unmapped (Shared)
Family Medicine Inpatient Service  Progress Note    Team: Family Medicine [facility] (pgr [phone])  PCP: [name], FNP  Date of Admission: April 18, 2023  Code Status: full code  Emergency Contact: Dellie Burns [phone])    Hospital Day: 4    ASSESSMENT / PLAN:   Brandi Flynn is a 22 y.o. female with a past medical history significant for MS and nephrolithiasis (s/p R ureteral stent placed 03/26/23) who presents with persistent flank pain, dysuria, and worsening nausea/vomiting/diarrhea, inability to tolerate PO.    O/N: ***    PRNs: ***    Vitals:   Temp:  [36.8 ??C (98.2 ??F)-37 ??C (98.6 ??F)] 36.9 ??C (98.4 ??F)  Heart Rate:  [74-111] 96  SpO2 Pulse:  [87-111] 90  Resp:  [17-25] 18  BP: (128-169)/(67-98) 130/72  FiO2 (%):  [2 %] 2 %  SpO2:  [91 %-97 %] 96 %    Wt Change: 308/309    I: 820 IV and 120 PO yesterday O: nursing reported urination post-op    PE: ***    Labs: ***    Imaging: ***    A/P:  - post-op lithotripsy: stent removal at home on 7/31, paged Uro for recs on abx and tamsulosin recs  - N/V/D: D resolved, N/V less freq *** and has tolerated meals post-op without vomiting ***  -  Fluids: None, good PO  Electrolytes:  Nutrition:  GI Regimen:        # Renal Colic 2/2 R nephrolithiasis s/p ureteral stent   Patient has had six hospitalizations for flank pain following R ureteral stent placement on 6/29, with CT scan on 7/22 showing migration of both R ureteral stent and calculus, likely causing her increased nausea, vomiting, and flank pain over the last few days. Urology has been consulted and is following, with pain recommendations described below. Lithotripsy and stent removal scheduled for today at [facility] (7/26). She remains afebrile but continues to endorse flank pain, with one episode of vomiting following dinner on 7/25 PM. Urine cultures from 7/22 returned normal GU flora on 7/26 so discontinued cefdinir. Overall, symptoms are likely 2/2 stent discomfort and nephrolithiasis, less likely due to pyelonephritis/UTI. Pain management is the primary goal for the patient, and she would like to avoid opioids given family history of OUD. The current pain regimen appears to mostly be covering her pain, with increased ditropan frequency to TID and continued gabapentin 300mg  TID with toradol 15mg  q6hr PRN. Plan is to continue her current pain regimen, as described below, and keep her inpatient following recovery for her procedure unless she is ready for discharge post-procedure.  Urology consulted and is following, appreciate recs  Discontinue cefdinir 300mg  BID (7/25-7/26)   Pain regimen:   Scheduled PO Tylenol 1g q8hr, Gabapentin 300 mg TID, Oxybutynin 5mg  PO TID  As needed IV toradol 15 mg q6hr, lidocaine patch, ice pack and heating pad  Plan to keep admitted until surgery, scheduled as an add-on at [facility] on 7/26  Pending post-op status, either plan for same-day discharge or discharge on 7/27 AM     # Nausea/Vomiting - Resolved diarrhea  # Inability to tolerate PO  Patient has been experiencing nausea and vomiting over past few weeks, with now resolved diarrhea. She has had decreased PO intake lately due to inability to tolerate PO without vomiting. S/p 1L IVF in ED. EKG on admission showed NSR, QTc 453 ms. Viral gastroenteritis and C. Diff workup both negative 7/23. She has progressively been  tolerating solids and liquids better following Zofran administration, but still endorses occasional vomiting. She is now aware of her Zofran PRN schedule and is planning to schedule her meals around the time she receives her Zofran. Encouraged increased water intake throughout the day and meals as tolerable. Will continue to monitor PO toleration.  Zofran 4mg  ODT PRN q6h, if unable to tolerate PO has Zofran 4mg  IV q6hr PRN  Consider repeat EKG if using often  Advance diet as tolerated  Encourage PO hydration, if unable to tolerate then restart IV rehydration therapy    # Multiple Sclerosis  Reports worsening blurriness and leg pain, similar symptoms that she felt before her first flare (and subsequent diagnosis of MS) in April 2024. Continue to monitor and advise outpatient follow-up after surgery. Encourage ambulation as tolerated.  Refresh celluvisc 1% 1 drop to each eye QID    # Headache  Has been taking high doses of tylenol for the past 2.5 weeks and describes waking up with headaches that are unremitting until she is about to go to bed. She noted some photophobia as well. Likely due to recent poor PO intake and increased analgesic use. Encouraged increased hydration and hopeful weaning off of tylenol following 7/26 procedure. She denies any headache on 7/25 AM, which is a notable improvement from baseline.  Will continue to monitor    # Checklist:  - IVF: None  - Daily labs needed: BMP  - Diet: Regular  - Bowel Regimen: No indication for a bowel regimen at this time  - DVT: Hold SQ Lovenox given impending procedure  - Code Status:   Orders Placed This Encounter   Procedures    Full Code     Standing Status:   Standing     Number of Occurrences:   1     - Dispo: Floor  [ ]  Anticipated Discharge Location: Home  [ ]  PT/OT/DME: No needs anticipated  [ ]  CM/SW needs: None anticipated  [ ]  Follow up appt: Has follow up appointment with Urology following lithotripsy and stent removal on 7/26    SUBJECTIVE:  Interval events:   Slept decently overnight but continues to endorse flank pain. She had another episode of vomiting following dinner last night, as her Zofran dosing was off schedule to line up with her eating schedule. Discussed surgery plan for today and is nervous but ready to have it done.    REVIEW OF SYSTEMS:  Pertinent positives and negatives per HPI. A complete review of systems otherwise negative.    PHYSICAL EXAM:  Temp:  [36.8 ??C (98.2 ??F)-37 ??C (98.6 ??F)] 36.9 ??C (98.4 ??F)  Heart Rate:  [74-111] 96  SpO2 Pulse:  [87-111] 90  Resp:  [17-25] 18  BP: (128-169)/(67-98) 130/72  FiO2 (%):  [2 %] 2 %  SpO2:  [91 %-97 %] 96 %    Intake/Output Summary (Last 24 hours) at 04/23/2023 0647  Last data filed at 04/22/2023 1645  Gross per 24 hour   Intake 820 ml   Output 0 ml   Net 820 ml       GEN: sitting up in bed, NAD, described continued pain in her R flank, had some questions regarding surgery and the plan for the day  HEENT: NCAT, No scleral icterus. Conjunctiva non-erythematous. MMM.  CV: Regular rate and rhythm. No murmurs/rubs/gallops.  Pulm: Normal work of breathing. CTAB. No wheezing, crackles, or rhonchi.  Abd: TTP in right upper and lower quadrants. No guarding, rebound. Normoactive bowel sounds.  R >>> L CVA tenderness.   Neuro: A&O x 3. No focal deficits.  Ext: No peripheral edema. Palpable distal pulses.  Skin: No rashes or skin lesions.     LABS/ STUDIES:    All imaging, laboratory studies, and other pertinent tests including electrocardiography within the last 24 hours were reviewed and are summarized within the assessment and plan.     Lilla Shook, MS3 04/22/2023     I attest that I have reviewed the student note and that the components of the history of the present illness, the physical exam, and the assessment and plan documented were performed by me or were performed in my presence by the student where I verified the documentation and performed (or re-performed) the exam and medical decision making.     Aurelio Jew MD PGY2

## 2023-04-23 NOTE — Unmapped (Signed)
New Urology Consult Note      Requesting Attending Physician:  Axel Filler, MD  Service Requesting Consult:  Family Medicine [facility])  Service Providing Consult: [facility]  Consulting Attending: Dr. Guy Sandifer    Assessment:    Patient is a 22 y.o. female with history of hypothyroidism, MS, obesity and nephrolithiasis who is s/p R ureteral stent placement for a 6 mm proximal ureteral stone on 03/26/2023. She is scheduled for USE this Friday, 7/26 at [facility]. She presented to the [facility] ER yesterday for complaints of worsening pain, ongoing n/v and inability to tolerate po intake.     n the ER she was afebrile and HDS. Vital signs have remained stable. Labs were wnl. UA appeared consistent with having an indwelling ureteral stent. Urine culture is pending. CT abd/pelvis was done which showed migration of proximal end of stent into proximal ureter and 6 mm stone in renal pelvis with mild hydronephrosis.     Discussed with patient that her CT scan shows mild migration of ureteral stent and new mild hydronephrosis. This can cause some increased discomfort. However, the hydronephrosis is most likely due to reflux of urine with stent in place and not signs of obstruction.     Discussed with patient that we do not recommend removing her ureteral stent prior to surgery due to possibility of introduction of bacteria into her urinary tract resulting in an infection which could delay her surgery as well as the possibility of having severe, recurrent pain from the stone. Also discussed that unfortunately that due to surgeon availability we are unable to perform her surgery prior to Friday. We will need to attempt to control her symptoms which may mean remaining admitted until surgery.     Update 04/23/23  No acute events overnight  S/p stone removal yesterday, uneventful  Stent on a string - remove 7/31 at home    Recommendations:  1. Would recommend multimodal stent pain control on discharge with flomax nightly, oxybutynin TID prn , ok for toradol or other NSAID, tylenol  2. Remove stent 7/31  3. Follow-up with urology has been requested  4. Ok for discharge from [facility] perspective    Dr. Guy Sandifer was available.     Thank you for this consult. Please contact the [facility] urology consult pager with any further questions/concerns.    History of Present Illness:     Reason for Consult:  n/v and worsening pain w/ ureteral stent     Brandi Flynn is seen in consultation for reasons noted above at the request of Axel Filler, MD on the Family Medicine [facility]) service.    This is a 22 yo patient with a history of hypothyroidism, MS, obesity and nephrolithiasis who is s/p R ureteral stent placement for a 6 mm proximal ureteral stone on 03/26/2023. She is scheduled for USE this Friday, 7/26 at [facility]. She presented to the [facility] ER yesterday for complaints of worsening pain, ongoing n/v and inability to tolerate po intake. This was her 6th ER visit since stent placement due to discomfort. Has been taking ditropan but now not able to tolerate po.     In the ER she was afebrile and HDS. Vital signs have remained stable. Labs were wnl. UA appeared consistent with having an indwelling ureteral stent. Urine culture is pending. CT abd/pelvis was done which showed migration of proximal end of stent into proximal ureter and 6 mm stone in renal pelvis with mild hydronephrosis.  Today she denies any fever, chills or gross hematuria.       Past Medical History:  Past Medical History:   Diagnosis Date    Disease of thyroid gland     Multiple sclerosis (CMS-HCC)     Obesity     Vertigo        Past Surgical History:   Past Surgical History:   Procedure Laterality Date    PR CYSTOSCOPY,INSERT URETERAL STENT Right 03/26/2023    Procedure: CYSTOURETHROSCOPY,  WITH INSERTION OF INDWELLING URETERAL STENT (EG, GIBBONS OR DOUBLE-J TYPE);  Surgeon: Annye English, MD;  Location: MAIN OR [facility];  Service: Urology    PR CYSTOURETHROSCOPY,URETER CATHETER Right 03/26/2023    Procedure: CYSTOURETHROSCOPY, W/URETERAL CATHETERIZATION, W/WO IRRIG, INSTILL, OR URETEROPYELOG, EXCLUS OF RADIOLG SVC;  Surgeon: Annye English, MD;  Location: MAIN OR [facility];  Service: Urology       Medication:  Current Facility-Administered Medications   Medication Dose Route Frequency Provider Last Rate Last Admin    acetaminophen (TYLENOL) tablet 1,000 mg  1,000 mg Oral Q8H SCH Elgart, Orion Crook, MD   1,000 mg at 04/22/23 0809    carboxymethylcellulose sodium (REFRESH CELLUVISC) 1 % ophthalmic gel 1 drop  1 drop Both Eyes QID Criss Rosales, MD   1 drop at 04/23/23 5784    cholecalciferol (vitamin D3 25 mcg (1,000 units)) tablet 25 mcg  25 mcg Oral Nightly Criss Rosales, MD   25 mcg at 04/22/23 2049    [Provider Hold] enoxaparin (LOVENOX) syringe 40 mg  40 mg Subcutaneous Q12H Repko, Alexander J, DO   40 mg at 04/19/23 1240    gabapentin (NEURONTIN) capsule 300 mg  300 mg Oral TID Criss Rosales, MD   300 mg at 04/22/23 2049    ketorolac (TORADOL) injection 15 mg  15 mg Intravenous Q6H PRN Criss Rosales, MD   15 mg at 04/22/23 2050    lidocaine 4 % patch 1 patch  1 patch Transdermal Daily Elgart, Orion Crook, MD   1 patch at 04/19/23 0847    ondansetron (ZOFRAN) injection 4 mg  4 mg Intravenous Q6H PRN Criss Rosales, MD   4 mg at 04/22/23 2050    ondansetron (ZOFRAN-ODT) disintegrating tablet 4 mg  4 mg Oral Q6H PRN Criss Rosales, MD   4 mg at 04/23/23 0612    oxybutynin (DITROPAN) tablet 5 mg  5 mg Oral TID Criss Rosales, MD   5 mg at 04/22/23 2049       Allergies:  Allergies   Allergen Reactions    Blueberry Hives    Peanut Swelling     Swelling tongue       Social History:  Social History     Tobacco Use    Smoking status: Every Day     Types: e-Cigarettes    Smokeless tobacco: Never    Tobacco comments:     Vapes   Vaping Use    Vaping status: Former    Substances: Nicotine   Substance Use Topics    Alcohol use: Not Currently    Drug use: Never       Family History  Family History Problem Relation Age of Onset    Thyroid disease Mother     Thyroid disease Maternal Grandmother     Thyroid disease Maternal Grandfather     Thyroid disease Paternal Grandmother     Vitiligo Paternal Grandmother        Review of Systems  10 systems were reviewed and are negative except as noted specifically in the HPI.    Objective:     Vital signs in last 24 hours:  BP 130/72  - Pulse 96  - Temp 36.9 ??C (98.4 ??F) (Oral)  - Resp 18  - Ht 160 cm (5' 3)  - Wt (!) 140.5 kg (309 lb 11.9 oz)  - SpO2 96%  - BMI 54.87 kg/m??     Intake/Output last 3 shifts:  I/O last 3 completed shifts:  In: 820 [P.O.:120; I.V.:700]  Out: 0     Physical Exam  General: NAD, A&O, resting, appropriate  HEENT: [place]/AT, EOMI   Pulmonary: Normal work of breathing on RA   Cardiovascular: Regular rate & rhythm, HDS, adequate peripheral perfusion  Abdomen: soft, NTTP, nondistended,    GU: voiding spontaneously  Extremities: warm and well perfused, no edema  Neuro: Appropriate, no focal neurological deficits    Most Recent Labs:  Lab Results   Component Value Date    WBC 9.4 04/19/2023    HGB 13.5 04/19/2023    HCT 40.1 04/19/2023    PLT 222 04/19/2023       Lab Results   Component Value Date    NA 140 04/22/2023    K 4.1 04/22/2023    CL 107 04/22/2023    CO2 27.5 04/22/2023    BUN 13 04/22/2023    CREATININE 0.64 04/22/2023    CALCIUM 9.6 04/22/2023    MG 1.7 03/26/2023    PHOS 2.2 (L) 03/26/2023       Lab Results   Component Value Date    ALKPHOS 53 04/18/2023    BILITOT 0.6 04/18/2023    BILIDIR 0.20 03/17/2023    PROT 7.3 04/18/2023    ALBUMIN 3.7 04/18/2023    ALT 42 04/18/2023    AST 32 04/18/2023       No results found for: PT, INR, APTT      Urine Culture:  Recent Labs     Units 04/18/23  2115   LABURIN  Mixed Urogenital Flora          IMAGING:  FL Fluoro < 60 Minutes (No Interp)    Result Date: 04/22/2023  Fluoroscopy was rendered to the performing Physician. The procedure will be resulted in HIM.

## 2023-04-23 NOTE — Unmapped (Signed)
Problem: Adult Inpatient Plan of Care  Goal: Plan of Care Review  Outcome: Progressing  Goal: Patient-Specific Goal (Individualized)  Outcome: Progressing  Goal: Absence of Hospital-Acquired Illness or Injury  Outcome: Progressing  Intervention: Identify and Manage Fall Risk  Recent Flowsheet Documentation  Taken 04/23/2023 0800 by Stephens November, RN  Safety Interventions:   low bed   fall reduction program maintained  Goal: Optimal Comfort and Wellbeing  Outcome: Progressing  Goal: Readiness for Transition of Care  Outcome: Progressing  Goal: Rounds/Family Conference  Outcome: Progressing     Problem: Infection  Goal: Absence of Infection Signs and Symptoms  Outcome: Progressing     Problem: Pain Acute  Goal: Optimal Pain Control and Function  Outcome: Progressing     Problem: Nausea and Vomiting  Goal: Nausea and Vomiting Relief  Outcome: Progressing     Problem: Fall Injury Risk  Goal: Absence of Fall and Fall-Related Injury  Outcome: Progressing  Intervention: Promote Injury-Free Environment  Recent Flowsheet Documentation  Taken 04/23/2023 0800 by Stephens November, RN  Safety Interventions:   low bed   fall reduction program maintained     Problem: Wound  Goal: Optimal Coping  Outcome: Progressing  Goal: Optimal Functional Ability  Outcome: Progressing  Goal: Absence of Infection Signs and Symptoms  Outcome: Progressing  Goal: Improved Oral Intake  Outcome: Progressing  Goal: Optimal Pain Control and Function  Outcome: Progressing  Goal: Skin Health and Integrity  Outcome: Progressing  Goal: Optimal Wound Healing  Outcome: Progressing

## 2023-04-23 NOTE — Unmapped (Signed)
Pt remain stable overnight, pain management, assisted with needs,family member at bedside, voided, no bm, encouraged to call for assistance with needs, call bless is within reach, will continue to monitor  Problem: Adult Inpatient Plan of Care  Goal: Absence of Hospital-Acquired Illness or Injury  Intervention: Identify and Manage Fall Risk  Recent Flowsheet Documentation  Taken 04/23/2023 0200 by Oletta Cohn, RN  Safety Interventions:   lighting adjusted for tasks/safety   low bed  Taken 04/23/2023 0000 by Oletta Cohn, RN  Safety Interventions:   fall reduction program maintained   lighting adjusted for tasks/safety   low bed  Taken 04/22/2023 2200 by Oletta Cohn, RN  Safety Interventions:   fall reduction program maintained   lighting adjusted for tasks/safety   low bed  Taken 04/22/2023 2000 by Oletta Cohn, RN  Safety Interventions:   fall reduction program maintained   family at bedside   lighting adjusted for tasks/safety   low bed  Intervention: Prevent and Manage VTE (Venous Thromboembolism) Risk  Recent Flowsheet Documentation  Taken 04/22/2023 2230 by Oletta Cohn, RN  VTE Prevention/Management:   anticoagulant therapy   ambulation promoted     Problem: Fall Injury Risk  Goal: Absence of Fall and Fall-Related Injury  Intervention: Promote Injury-Free Environment  Recent Flowsheet Documentation  Taken 04/23/2023 0200 by Oletta Cohn, RN  Safety Interventions:   lighting adjusted for tasks/safety   low bed  Taken 04/23/2023 0000 by Oletta Cohn, RN  Safety Interventions:   fall reduction program maintained   lighting adjusted for tasks/safety   low bed  Taken 04/22/2023 2200 by Oletta Cohn, RN  Safety Interventions:   fall reduction program maintained   lighting adjusted for tasks/safety   low bed  Taken 04/22/2023 2000 by Oletta Cohn, RN  Safety Interventions:   fall reduction program maintained   family at bedside   lighting adjusted for tasks/safety   low bed     Problem: Wound  Goal: Optimal Functional Ability  Intervention: Optimize Functional Ability  Recent Flowsheet Documentation  Taken 04/22/2023 2000 by Oletta Cohn, RN  Activity Management: up ad lib  Goal: Skin Health and Integrity  Intervention: Optimize Skin Protection  Recent Flowsheet Documentation  Taken 04/22/2023 2000 by Oletta Cohn, RN  Activity Management: up ad lib     Problem: Wound  Goal: Skin Health and Integrity  Intervention: Optimize Skin Protection  Recent Flowsheet Documentation  Taken 04/22/2023 2000 by Oletta Cohn, RN  Activity Management: up ad lib

## 2023-04-23 NOTE — Unmapped (Signed)
Physician Discharge Summary [facility]  1 [facility]  [address]  [place] [address]  Dept: [phone]  Loc: [phone]     Identifying Information:   Brandi Flynn  14-Mar-2001  [id number]    Primary Care Physician: [name], FNP   Code Status: Full Code    Admit Date: 04/18/2023    Discharge Date: 04/23/2023     Discharge To: Home    Discharge Service: [facility] - FAM [place] [facility]     Discharge Attending Physician: Axel Filler, MD    Discharge Diagnoses:  Principal Problem:    Nausea & vomiting  Active Problems:    Multiple sclerosis (CMS-HCC)    Kidney stones  Resolved Problems:    * No resolved hospital problems. *      Outpatient Provider Follow Up Issues:   [ ]  Ensure successful stent removal (planned for home removal 7/31)  [ ]  Urology to schedule patient for follow-up     Hospital Course:   Brandi Flynn is a 22 y.o. female with a past medical history significant for MS and nephrolithiasis (s/p R ureteral stent placed 03/26/23) who presented with persistent flank pain, dysuria, worsening nausea/vomiting/diarrhea, and inability to tolerate PO intake. Patient has had 5 prior ED visits and 1 admission (7/14-7/17) for the same since the stent was placed. CT scan on 7/22 re-demonstrated R ureteral calculus and R ureteral stent, with new mild R hydronephrosis when compared to prior. Urinalysis collected 7/22 resulted normal GU flora. Cdiff and GIPP were negative. Pt remained afebrile with no leokocytosis throughout admission. Due to difficult to control pain and N/V, patient remained admitted until her previously scheduled lithotripsy and stent exchange on 7/26. She tolerated the procedure well with significant improvement in her symptoms post-procedure. Plan for stent-on-a-string self-removal on 7/31 with one dose of ciprofloxacin taken 2h prior. She was prescribed oral ketorolac and tylenol to use as needed post-procedure, but recommend discontinuing daily use of these medications as soon as possible due to significant NSAID use over the past few weeks and concern that her recent headaches are rebound iso frequent analgesia use.      Patient was continued on her home medications for chronic conditions. Of note, patient is normally on oxybutynin XR 10 mg daily. Due to formulary availability, she was switched to ER 5 mg TID during admission. Patient felt this was more effective for her bladder symptoms and will be continuing on this regimen.           Touchbase with Outpatient Provider:  Warm Handoff: Completed on 04/23/23 by Katherine Roan, MD  (Resident) via [facility]    Procedures:  Lithotripsy + stent exchange  No admission procedures for hospital encounter.  ______________________________________________________________________  Discharge Medications:     Your Medication List        STOP taking these medications      oxybutynin 10 MG 24 hr tablet  Commonly known as: DITROPAN XL  Replaced by: oxybutynin 5 MG tablet            START taking these medications      ciprofloxacin HCl 500 MG tablet  Commonly known as: CIPRO  Take 1 tablet (500 mg total) by mouth once for 1 dose. Take the 1-2 hours prior to ureteral stent removal     oxybutynin 5 MG tablet  Commonly known as: DITROPAN  Take 1 tablet (5 mg total) by mouth Three (3) times a day.  Replaces: oxybutynin 10 MG 24 hr  tablet     phenazopyridine 100 MG tablet  Commonly known as: PYRIDIUM  Take 2 tablets (200 mg total) by mouth Three (3) times a day as needed for pain for up to 7 days.            CONTINUE taking these medications      acetaminophen 500 MG tablet  Commonly known as: TYLENOL EXTRA STRENGTH  Take 2 tablets (1,000 mg total) by mouth every eight (8) hours for 7 days.     cetirizine 10 MG tablet  Commonly known as: ZYRTEC  Take 1 tablet (10 mg total) by mouth daily as needed for allergies.     cholecalciferol-25 mcg (1,000 unit) 1,000 unit (25 mcg) tablet  Generic drug: cholecalciferol (vitamin D3 25 mcg (1,000 units))  Take 1 tablet (25 mcg total) by mouth daily.     famotidine 20 MG tablet  Commonly known as: PEPCID  Take 1 tablet (20 mg total) by mouth two (2) times a day.     gabapentin 300 MG capsule  Commonly known as: NEURONTIN  Take 2 capsules (600 mg total) by mouth Three (3) times a day.     ketorolac 10 mg tablet  Commonly known as: TORADOL  Take 1 tablet (10 mg total) by mouth every six (6) hours as needed for pain for up to 5 days.     loperamide 2 mg tablet  Commonly known as: IMODIUM A-D  Take 1 tablet (2 mg total) by mouth four (4) times a day as needed for diarrhea.     metoclopramide 10 MG tablet  Commonly known as: REGLAN  Take 1 tablet (10 mg total) by mouth every six (6) hours for 7 days.     nystatin 100,000 unit/gram powder  Commonly known as: MYCOSTATIN  Apply 1 Application topically two (2) times a day.     ondansetron 8 MG disintegrating tablet  Commonly known as: ZOFRAN-ODT  Dissolve 1 tablet (8 mg total) in the mouth every eight (8) hours as needed for nausea.     REFRESH CELLUVISC 1 % Dpge  Generic drug: carboxymethylcellulose sodium  Administer 1 drop to both eyes four (4) times a day.            ASK your doctor about these medications      oxyCODONE 5 MG immediate release tablet  Commonly known as: ROXICODONE  Take 1 tablet (5 mg total) by mouth every four (4) hours as needed for pain for up to 5 days.              Allergies:  Blueberry and Peanut  ______________________________________________________________________  Pending Test Results (if blank, then none):  Pending Labs       Order Current Status    Stone Analysis In process            Most Recent Labs:  All lab results last 24 hours -   Recent Results (from the past 24 hour(s))   POCT Glucose    Collection Time: 04/22/23  1:15 PM   Result Value Ref Range    Glucose, POC 104 70 - 179 mg/dL   POCT Glucose    Collection Time: 04/22/23  8:24 PM   Result Value Ref Range    Glucose, POC 138 70 - 179 mg/dL       Relevant Studies/Radiology (if blank, then none):  FL Fluoro < 60 Minutes (No Interp)    Result Date: 04/22/2023  Fluoroscopy was rendered to the performing Physician.  The procedure will be resulted in HIM.    ECG 12 Lead    Result Date: 04/19/2023  NORMAL SINUS RHYTHM NORMAL ECG WHEN COMPARED WITH ECG OF 19-Apr-2023 01:01, NO SIGNIFICANT CHANGE WAS FOUND    CT abdomen pelvis without contrast    Result Date: 04/18/2023  EXAM: CT ABDOMEN PELVIS WO CONTRAST ACCESSION: [id number] UN CLINICAL INDICATION: 22 years old with R kidney stone  COMPARISON: CT abdomen pelvis 03/30/2023. TECHNIQUE: A spiral CT scan was obtained without IV contrast from the lung bases to the pubic symphysis.  Images were reconstructed in the axial plane. Coronal and sagittal reformatted images were also provided for further evaluation. Evaluation of the solid organs and vasculature is limited in the absence of intravenous contrast. FINDINGS: LOWER CHEST: Unremarkable. LIVER: Normal liver contour. Diffuse hepatic steatosis. No focal liver lesion on non-contrast examination. BILIARY: The gallbladder is normal in appearance. No intrahepatic biliary ductal dilatation. SPLEEN: Normal in size and contour. PANCREAS: Normal pancreatic contour without signs of inflammation or gross ductal dilatation. ADRENAL GLANDS: Normal appearance of the adrenal glands. KIDNEYS/URETERS: Smooth renal contours.  No nephrolithiasis.  No ureteral dilatation or collecting system distention. BLADDER: Right nephroureteral stent with proximal portion coiled at the ureteropelvic junction (3:58) and distal portion coiled in the bladder. Mild inflammatory stranding along the course of the right ureter (2:104 for reference). There is a 6 mm nonobstructive right renal stone in the lower pole. Mild right hydronephrosis which is new compared to prior. No hydronephrosis on the left side. REPRODUCTIVE ORGANS: Unremarkable. GI TRACT: No findings of bowel obstruction or acute inflammation. Normal appendix. Colonic diverticulosis. PERITONEUM/RETROPERITONEUM AND MESENTERY: No free air. No ascites. No fluid collection. VASCULATURE: Normal caliber aorta. Otherwise, limited evaluation without contrast. LYMPH NODES: No adenopathy. BONES and SOFT TISSUES: No aggressive osseous lesions. No focal soft tissue lesions.     Mild migration of the proximal ureteral stent into the ureteropelvic junction with new mild right hydronephrosis. A nonobstructive 0.6 cm calculus in the right lower pole kidney which is likely secondary to the migration of the previously identified proximal/mid ureter stone to the inferior calyx of the right kidney. No additional stones are identified along the right ureter. Nonspecific right periureteral inflammatory stranding, possibly related to indwelling stent migration or suggestive of ascending urinary tract infection; new since 03/30/2023 CT. Additional chronic/incidental findings as described in the body of the report. ==================== MODIFIED REPORT: (04/18/2023 8:57 AM) This report has been modified from its preliminary version; you may check the prior versions of radiology report, results history link for prior report versions (if they were previously visible in Epic). -----------------------------------------------    ______________________________________________________________________  Discharge Instructions:   Activity Instructions       Activity as tolerated     Additional Activity Instructions:    Instructions after Anesthesia:    Do not drink alcoholic beverages, sign legal documents, or operate heavy machinery for the next 24 hours or while taking narcotic pain medications.  You may feel light headed and/or dizzy. Get up slowly and rest for the next 24 hours even if you are feeling well.    If you experience the following please refer to the number provided by the surgeon or contact the doctor on call at [phone]:    If you are unable to urinate when your bladder feels full or if you have not urinated in 8 hours.  If you are unable to tolerate fluids within 8 hours of discharge.  Diet Instructions      Nausea:   Occasionally patients experience nausea (upset stomach) following surgery which may be due to anesthesia or the pain medication you are taking.  Eating small meals and drinking lots of fluids can help decrease nausea.  It is important NOT to take your pain medication on an empty stomach as this can make your nausea worse.   If you experience persistent nausea or vomiting please contact your surgeon's office or the physician on call after hours.     Diet:   Resume your normal diet.   Gradually drink 8-10 glasses of non-caffeinated liquid per day.              Other Instructions       Discharge instructions      POST OP DISCHARGE INSTRUCTIONS - Cysto    REMOVE YOUR STENT 5 DAYS FROM SURGERY    Appointment: Larina Bras - Treated: If you have not already been scheduled, you should soon receive a call to schedule a follow-up appointment. If you do not hear from [place] over the next few business days, please use the Urology Clinic number below to call and schedule your appointment. If a stent was placed intraoperatively, see below for more information on what to expect.    Medications:   Tylenol: You have been prescribed extra-strength Tylenol for pain control. We recommend that you take Tylenol as scheduled (1,000 mg every 6 hours) while awake in the first 3-4 days following discharge. Do not exceed 4,000mg  of Tylenol per day.   NSAID: If prescribed an NSAID (e.g. Celebrex, Toradol), you may alternate between doses of Tylenol and NSAIDs for optimal pain relief. Do not take if you have a history of ulcers, liver disease, or if a medical doctor has told you not to take NSAIDs. This medication can cause reflux and stomach irritation, so please take with food. Do not combine with different NSAIDs, such as ibuprofen (Advil, Motrin, etc) or naproxen (Alleve).   Ditropan: Ditropan is taken as needed to help with bladder spasms. If you have a Foley catheter that is scheduled for removal, stop taking Ditropan 2 days prior to your appointment.   Flomax: Take daily to help improve urinary flow.   Peri-Cath Antibiotics: You've been prescribed an antibiotic to take the morning you come into clinic to get your foley catheter or stent removed.     Diet: You may resume eating and drinking whatever you wish. Drink plenty of water.      Activity: Be sure to continue taking frequent walks during the day. There are no specific activity restrictions. You may drive a car 3-4 days after surgery if not taking narcotic pain medication.     Common Post Operative Concerns: It is common to experience the following symptoms after your surgery. Drinking plenty of fluids will help reduce these symptoms.  - Burning with urination.   - Feelings like you must use the bathroom frequently or urgently.    - Blood in the urine. (This is normal if you can still see-through your urine. Call if you notice that the blood in your urine thickens or has clots.)     Tubes, Stents, or Drains: Not applicable.   Stent: You are being discharged home with a ureteral stent. Please see below for instructions regarding what to expect.     Warning Signs: Always call for: Fever over 101??F, difficulty breathing, chest pain, palpitations, vomiting, swelling or pain in one leg. Come to the Emergency room  if the office is closed.    Contact Information: If you need assistance weekdays between 8:00AM - 4:00PM, please call the Urology clinic at [phone]. If you have a nighttime or weekend EMERGENCY, please call [phone] and ask to speak to the Urology resident on call. Note, we are handling all incoming hospital and outside calls, so please be patient and we will be in touch with you as soon as possible.       DISCHARGING WITH A STENT - WHAT NOW?     WHAT IS A STENT?  A ureteral stent is a flexible, thin hollow tube (like a straw) that helps the kidney drain urine. Unless instructed otherwise, your stent should not stay longer than 3 MONTHS.    WHAT SHOULD I EXPECT?    You may resume your normal routine. You may experience some of the below common symptoms after your stent has been placed:   - An urgent need to pass urine.  - The need to pass urine more often.  - Burning or pain in your lower back when passing urine.  - Blood in your urine   - Feeling as if you are not able to empty your bladder all the way.  - Sharp or cramping pain in your back, side, or lower abdomen.     WHAT IS STENT PAIN?   Stents can cause sharp or cramping pain in the back, side, or lower abdomen. You may also notice a strong desire to urinate with pain over the bladder area (this is called a bladder spasm).     You may have been prescribed some of the following medications to help:   Tylenol - You can take up to 4g/day of Tylenol. Ask your doctor if you have liver problems.    NSAIDs (Motrin, Aleve) - Effective in alleviating cramping pain. Do not use if history of ulcers or have been told by a doctor to avoid. Can cause reflux or stomach irritation.  Narcotics - Take for pain not relieved by Tylenol or NSAIDs. Can cause constipation, nausea, itchiness, dizziness, and drowsiness.   Flomax - Taken nightly to help increase the flow of urine. Can cause lightheadedness or drop in blood pressure.    Ditropan - Taken as needed for bladder spasms. Can cause dry mouth, dizziness, dry eyes, stomach upset, dry or flushed skin. Should be used cautiously in those older than 60 years.   Pyridium - Alleviates burning with urination. Turns your urine orange. Should not be used for >3 days or in patients with kidney dysfunction.     I SEE BLOOD IN MY URINE, IS THIS NORMAL?   Stents may cause your urine to be tea colored, pink, or bright red. You may even notice some clots. Blood in urine should come and go. The color can range from light pink/orange to cherry colored. You will likely notice it worsen after physical activity or straining to have a bowel movement. Drinking plenty of fluids should help. You may continue to monitor as long as urine remains see-through.    WHEN SHOULD I CALL?     Red Light: Act NOW. You should be seen right away. Call 911 or go to nearest hospital.   - Hard to breathe.   - New seizure.   - Chest pain or worsening shortness of breath.   - Fall or passing out.    Yellow Light: Act TODAY. Call our office. You may need to be seen that day.  - Fever over 101??F  -  Nausea and/or vomiting. Unable to eat or drink.    - No urine for 10 hours.    - Confusion.   - Pain that is severe, worsening, and not relieved by pain medications.   - Blood in in the urine is thick and bright red, to the point where you can no longer see-through it.   - Clots in the urine are larger than the size of a penny.   - Stent falls out.     Green Light: No need to act. Your symptoms are under control. Keep all visits as scheduled.   - Pain or discomfort  - Urinary frequency or urgency   - Burning or pain during urination   - Blood in the urine   - Sensation of incomplete emptying of the bladder    Discharge instructions      You were admitted to [facility] for flank pain due to a kidney stone. Your kidney stone has now been removed. You should remove your stent on 7/31. Please take 1 dose of ciprofloxacin 2 hours prior to stent removal.    How to remove your stent  1. Wash and clean your hands thoroughly  2. It is important to try and relax. This will make removal easier.  3. Take hold of the string and with a firm, steady motion, pull the stent until it is out. Remember that it is approximately 25-30 cm long. This will feel uncomfortable but it should not be painful.  Once the stent is removed you will probably experience some pain the next time you pass urine and you may also notice blood in your urine. This is quite normal and it will pass.  Make sure you drink enough fluid to keep your urine a pale yellow colour. This will reduce the likelihood of blood clots in your urine.    See the Urology post-op instructions for additional post-procedure management. Urology will call your for additional follow-up.     Please carefully read and follow these instructions below upon your discharge:    1) Please take your medications as prescribed and note the changes listed on your discharge. At future follow-up appointments, please be sure to take all of your medications with you so your provider can better guide your care.     2) Seek medical care with your primary care doctor or local Emergency Room or Urgent Care if you develop any changes in your mental status, worsening abdominal pain, fevers greater than 101.5, any unexplained/unrelieved shortness of breath, uncontrolled nausea and vomiting that keeps you from remaining hydrated or taking your medication, or any other concerning symptoms.     3) Please go to your follow-up appointments. Some of your follow-up appointments have been listed below. If you do not see an appointment listed below with your primary care doctor, please call your doctor's office as soon as possible to schedule an appointment to be seen within 7-10 days of discharge.     4) If you have any concerns before you are able to follow-up with your primary care doctor, you can reach [place] by calling [phone] and asking to page the [facility] Team resident on call.                 Follow Up instructions and Outpatient Referrals     Discharge instructions      Discharge instructions          Appointments which have been scheduled for you  Apr 25, 2023 3:40 PM  (Arrive by 3:25 PM)  RETURN CARE TRANSITIONS [place] with [name], FNP  [facility] [address] [place] [address] [facility] [place]) [address] [place] [address]  [address]  [place] [address]  [phone]        Apr 27, 2023 1:30 PM  (Arrive by 1:00 PM)  [place] LUMBAR PUNCTURE - RADIOLOGY with Onnie Boer RM 7  [facility]) [address]  [place] [address]  [phone]   [place]  Aggrenox/aspirin and extended release dipyridamole - do not stop  Coumadin/warfarin - stop 5 days prior & get order for INR am of procedure  Lovenox/enoxaparin - stop 12 hours prior  Plavix/clopidogrel - stop 5 days prior  Brilinta/ticagrelor - stop 5 days prior  Pletal/cilostazol  - stop 5 days prior  Pradaxa/dabigatran - stop 1-2 days prior (normal renal function vs moderate renal impairment)  Xarelto/ rivaroxaban - stop 1 day before  Eliquis/apixavan - stop 2 days before  Will need someone to drive them home.  Inform of 1 hr. post recovery.  If patient had recent CT/MRI of the head send report with the order. If this is for a methotrexate LP, must get order for methotrexate.         May 10, 2023 10:00 AM  (Arrive by 9:45 AM)  Physical with [name], FNP  [facility] [address] [place] [address] [facility]ION) [address] [place] [address]  [address]  [place] [address]  [phone]   Arrive 15 minutes prior to appointment.         May 19, 2023 1:20 PM  (Arrive by 1:05 PM)  RETURN CONTINUITY with Lucille Passy, MD  [facility] [address] [place] [address] [facility] REGION) [address] [place] [address]  [address]  [place] [address]  [phone]        Jun 27, 2023 2:00 PM  (Arrive by 1:45 PM)  RETURN NEUROIMMUNOLOGY with Clara Doreatha Massed, PA  [facility] NEUROLOGY CLINIC [facility] REGION) [address]  [address]  [place] [address]  [phone]        Jul 27, 2023 9:15 AM  NEW  NEURO OPTHAMOLOGY with Tressie Stalker, MD  [facility] OPHTHALMOLOGY [address] [facility] REGION) [address]  [address]  [place] [address]  [phone]             ______________________________________________________________________  Discharge Day Services:  BP 137/80  - Pulse 101  - Temp 36.7 ??C (98.1 ??F) (Oral)  - Resp 20  - Ht 160 cm (5' 3)  - Wt (!) 140.5 kg (309 lb 11.9 oz)  - SpO2 97%  - BMI 54.87 kg/m??   Pt seen on the day of discharge and determined appropriate for discharge.    GEN: well appearing, lying in bed, NAD   HEENT: NCAT, MMM. EOMI.  Neck: Supple.  CV: Regular rate and rhythm. No murmurs/rubs/gallops.  Pulm: CTAB. No wheezing, crackles, or rhonchi.  Abd: Flat.  Nontender. No guarding, rebound. No flank tenderness.     Neuro: A&O x 3. No focal deficits.   Ext: No peripheral edema.  Palpable distal pulses.    Condition at Discharge: good    Length of Discharge: I spent greater than 30 mins in the discharge of this patient.     Dala Dock, MD  Family Medicine, PGY 2

## 2023-04-23 NOTE — Unmapped (Signed)
Problem: Adult Inpatient Plan of Care  Goal: Plan of Care Review  04/23/2023 1231 by Stephens November, RN  Outcome: Resolved  04/23/2023 0920 by Stephens November, RN  Outcome: Progressing  Goal: Patient-Specific Goal (Individualized)  04/23/2023 1231 by Stephens November, RN  Outcome: Resolved  04/23/2023 0920 by Stephens November, RN  Outcome: Progressing  Goal: Absence of Hospital-Acquired Illness or Injury  04/23/2023 1231 by Stephens November, RN  Outcome: Resolved  04/23/2023 0920 by Stephens November, RN  Outcome: Progressing  Intervention: Identify and Manage Fall Risk  Recent Flowsheet Documentation  Taken 04/23/2023 0800 by Stephens November, RN  Safety Interventions:   low bed   fall reduction program maintained  Goal: Optimal Comfort and Wellbeing  04/23/2023 1231 by Stephens November, RN  Outcome: Resolved  04/23/2023 0920 by Stephens November, RN  Outcome: Progressing  Goal: Readiness for Transition of Care  04/23/2023 1231 by Stephens November, RN  Outcome: Resolved  04/23/2023 0920 by Stephens November, RN  Outcome: Progressing  Goal: Rounds/Family Conference  04/23/2023 1231 by Stephens November, RN  Outcome: Resolved  04/23/2023 0920 by Stephens November, RN  Outcome: Progressing     Problem: Infection  Goal: Absence of Infection Signs and Symptoms  04/23/2023 1231 by Stephens November, RN  Outcome: Resolved  04/23/2023 0920 by Stephens November, RN  Outcome: Progressing     Problem: Pain Acute  Goal: Optimal Pain Control and Function  04/23/2023 1231 by Stephens November, RN  Outcome: Resolved  04/23/2023 0920 by Stephens November, RN  Outcome: Progressing     Problem: Nausea and Vomiting  Goal: Nausea and Vomiting Relief  04/23/2023 1231 by Stephens November, RN  Outcome: Resolved  04/23/2023 0920 by Stephens November, RN  Outcome: Progressing     Problem: Fall Injury Risk  Goal: Absence of Fall and Fall-Related Injury  04/23/2023 1231 by Stephens November, RN  Outcome: Resolved  04/23/2023 0920 by Stephens November, RN  Outcome: Progressing  Intervention: Promote Injury-Free Environment  Recent Flowsheet Documentation  Taken 04/23/2023 0800 by Stephens November, RN  Safety Interventions:   low bed   fall reduction program maintained     Problem: Wound  Goal: Optimal Coping  04/23/2023 1231 by Stephens November, RN  Outcome: Resolved  04/23/2023 0920 by Stephens November, RN  Outcome: Progressing  Goal: Optimal Functional Ability  04/23/2023 1231 by Stephens November, RN  Outcome: Resolved  04/23/2023 0920 by Stephens November, RN  Outcome: Progressing  Goal: Absence of Infection Signs and Symptoms  04/23/2023 1231 by Stephens November, RN  Outcome: Resolved  04/23/2023 0920 by Stephens November, RN  Outcome: Progressing  Goal: Improved Oral Intake  04/23/2023 1231 by Stephens November, RN  Outcome: Resolved  04/23/2023 0920 by Stephens November, RN  Outcome: Progressing  Goal: Optimal Pain Control and Function  04/23/2023 1231 by Stephens November, RN  Outcome: Resolved  04/23/2023 0920 by Stephens November, RN  Outcome: Progressing  Goal: Skin Health and Integrity  04/23/2023 1231 by Stephens November, RN  Outcome: Resolved  04/23/2023 0920 by Stephens November, RN  Outcome: Progressing  Goal: Optimal Wound Healing  04/23/2023 1231 by Stephens November, RN  Outcome: Resolved  04/23/2023 0920 by Stephens November, RN  Outcome: Progressing

## 2023-04-27 ENCOUNTER — Ambulatory Visit
Admit: 2023-04-27 | Discharge: 2023-04-28 | Disposition: A | Discharge status: Home / Self Care | Payer: PRIVATE HEALTH INSURANCE

## 2023-04-27 ENCOUNTER — Emergency Department
Admit: 2023-04-27 | Discharge: 2023-04-28 | Disposition: A | Discharge status: Home / Self Care | Payer: PRIVATE HEALTH INSURANCE

## 2023-04-27 LAB — CBC W/ AUTO DIFF
BASOPHILS ABSOLUTE COUNT: 0.1 10*9/L (ref 0.0–0.1)
BASOPHILS RELATIVE PERCENT: 0.5 %
EOSINOPHILS ABSOLUTE COUNT: 0.3 10*9/L (ref 0.0–0.5)
EOSINOPHILS RELATIVE PERCENT: 3 %
HEMATOCRIT: 41.5 % (ref 34.0–44.0)
HEMOGLOBIN: 13.9 g/dL (ref 11.3–14.9)
LYMPHOCYTES ABSOLUTE COUNT: 1.8 10*9/L (ref 1.1–3.6)
LYMPHOCYTES RELATIVE PERCENT: 17.3 %
MEAN CORPUSCULAR HEMOGLOBIN CONC: 33.6 g/dL (ref 32.3–35.0)
MEAN CORPUSCULAR HEMOGLOBIN: 29.7 pg (ref 25.9–32.4)
MEAN CORPUSCULAR VOLUME: 88.5 fL (ref 77.6–95.7)
MEAN PLATELET VOLUME: 8.3 fL (ref 7.3–10.7)
MONOCYTES ABSOLUTE COUNT: 0.9 10*9/L — ABNORMAL HIGH (ref 0.3–0.8)
MONOCYTES RELATIVE PERCENT: 8.5 %
NEUTROPHILS ABSOLUTE COUNT: 7.5 10*9/L — ABNORMAL HIGH (ref 1.5–6.4)
NEUTROPHILS RELATIVE PERCENT: 70.7 %
NUCLEATED RED BLOOD CELLS: 0 /100{WBCs} (ref <=4)
PLATELET COUNT: 241 10*9/L (ref 170–380)
RED BLOOD CELL COUNT: 4.68 10*12/L (ref 3.95–5.13)
RED CELL DISTRIBUTION WIDTH: 13.3 % (ref 12.2–15.2)
WBC ADJUSTED: 10.6 10*9/L — ABNORMAL HIGH (ref 4.2–10.2)

## 2023-04-27 LAB — COMPREHENSIVE METABOLIC PANEL
ALBUMIN: 3.8 g/dL (ref 3.4–5.0)
ALKALINE PHOSPHATASE: 56 U/L (ref 46–116)
ALT (SGPT): 46 U/L (ref 10–49)
ANION GAP: 5 mmol/L (ref 5–14)
AST (SGOT): 28 U/L (ref <=34)
BILIRUBIN TOTAL: 0.4 mg/dL (ref 0.3–1.2)
BLOOD UREA NITROGEN: 8 mg/dL — ABNORMAL LOW (ref 9–23)
BUN / CREAT RATIO: 12
CALCIUM: 9.4 mg/dL (ref 8.7–10.4)
CHLORIDE: 108 mmol/L — ABNORMAL HIGH (ref 98–107)
CO2: 27.7 mmol/L (ref 20.0–31.0)
CREATININE: 0.68 mg/dL
EGFR CKD-EPI (2021) FEMALE: >90 mL/min/{1.73_m2} (ref >=60)
GLUCOSE RANDOM: 122 mg/dL (ref 70–179)
POTASSIUM: 4 mmol/L (ref 3.4–4.8)
PROTEIN TOTAL: 7.3 g/dL (ref 5.7–8.2)
SODIUM: 141 mmol/L (ref 135–145)

## 2023-04-27 LAB — URINALYSIS WITH MICROSCOPY WITH CULTURE REFLEX PERFORMABLE
BACTERIA: NONE SEEN /HPF
BILIRUBIN UA: NEGATIVE
GLUCOSE UA: NEGATIVE
KETONES UA: NEGATIVE
NITRITE UA: NEGATIVE
PH UA: 6.5 (ref 5.0–9.0)
PROTEIN UA: NEGATIVE
RBC UA: 38 /HPF — ABNORMAL HIGH (ref <=4)
SPECIFIC GRAVITY UA: 1.014 (ref 1.003–1.030)
SQUAMOUS EPITHELIAL: 1 /HPF (ref 0–5)
UROBILINOGEN UA: <2
WBC UA: 6 /HPF — ABNORMAL HIGH (ref 0–5)

## 2023-04-27 LAB — PREGNANCY, URINE: PREGNANCY TEST URINE: NEGATIVE

## 2023-04-27 LAB — LIPASE: LIPASE: 35 U/L (ref 12–53)

## 2023-04-27 NOTE — Unmapped (Signed)
Trying to pull out the stent and it is very painful needs to know what she she do. Please call her at [phone]

## 2023-04-27 NOTE — Unmapped (Signed)
Pt scheduled to pull her stent today she has tried and now having nausea / vomiting/ diarrhea and stent causing excruciating pain.     States it will not come out   Pain is in the urethra.   Pt to proceed to [facility] ED for evaluation of displaced stent and ? Removal of stent.     Elvina Sidle, LPN

## 2023-04-28 MED ADMIN — ketorolac (TORADOL) injection 15 mg: 15 mg | INTRAMUSCULAR | @ 06:00:00 | Stop: 2023-04-28

## 2023-04-28 NOTE — Unmapped (Signed)
[facility] Emergency Department Provider Note      History, MDM, ED Course     History: Brandi Flynn is a 22 y.o. female with history of obesity, multiple sclerosis, kidney stones who presents with abdominal pain in the setting of her attempting to remove her ureteral stent. Patient was recently seen by urology and had lithotripsy performed with stent exchange on 04/22/23. Plan was for stent-on-a-string self removal on 7/31. Patient states she attempted to remove the stent herself but it was too painful. She tried once more and felt like it was not moving and again was too painful. She called her urology office who told her to come to the emergency department for evaluation. She denies nausea, vomiting, fevers, chills, hematuria, diarrhea.     On exam patient appears well and in no acute distress. VSS. Abdomen with mild right sided tenderness.     Impression/Ddx:  22 year old female with history and physical exam as above. At the time of my evaluation the patient had blood work and urine studies back.  CBC with very mild leukocytosis of 10.6, otherwise unremarkable.  CMP unremarkable.  Urinalysis with 30 red blood cells, no significant evidence of infection.  Normal lipase.  Will plan to discuss plan with urology.    Progress Notes:  ED Course as of 04/28/23 0358   Thu Apr 28, 2023   1610 Urology paged   0127 I spoke with urology regarding the patient.  They state that we can try gently retracting on the stent to see if it will remove here.  If not they can see the patient when they arrive in the morning.  I discussed with the patient this plan and she is agreeable.  I discussed anxiolysis and pain control with Ativan and oxycodone but the patient declined stating that she only wants to take Toradol.  Will administer a dose of Toradol and then attempt to remove the stent.   0217 Was able to remove the patient's stent at the bedside with RN chaperone.  Stent was removed and appeared to be entirely intact.  Patient already has scheduled follow-up with urology.  She is amenable with discharge at this time.  She already has home prescriptions of Toradol which she continues to take for pain.  Will discharge in stable condition.   __________________________________________________________________    The case was discussed with the attending physician who is in agreement with the above assessment and plan.    Additional Medical Decision Making     - Any discussion of this patient's case/presentation between myself and consultants, admitting teams, or other team members has been documented above.  - Imaging and other studies, if performed, that were available during my care of the patient were independently reviewed and interpreted by me and considered in my medical decision making as documented above.  - External records reviewed: I reviewed the patient's discharge summary on 04/23/2023.  - Consideration of admission, observation, transfer, or escalation of care: N/A    History     Chief Complaint:   Chief Complaint   Patient presents with    Abdominal Pain       History of Present Illness:  As documented above.    Additional history during this encounter provided by: N/A    Past Medical History:  Past Medical History:   Diagnosis Date    Disease of thyroid gland     Multiple sclerosis (CMS-HCC)     Obesity     Vertigo  Medications:   No current facility-administered medications for this encounter.    Current Outpatient Medications:     acetaminophen (TYLENOL EXTRA STRENGTH) 500 MG tablet, Take 2 tablets (1,000 mg total) by mouth every eight (8) hours for 7 days., Disp: 42 tablet, Rfl: 0    carboxymethylcellulose sodium (REFRESH CELLUVISC) 1 % DpGe, Administer 1 drop to both eyes four (4) times a day., Disp: 1 each, Rfl: 0    cetirizine (ZYRTEC) 10 MG tablet, Take 1 tablet (10 mg total) by mouth daily as needed for allergies., Disp: 30 tablet, Rfl: 0    cholecalciferol, vitamin D3 25 mcg, 1,000 units,, (CHOLECALCIFEROL-25 MCG, 1,000 UNIT,) 1,000 unit (25 mcg) tablet, Take 1 tablet (25 mcg total) by mouth daily., Disp: , Rfl:     famotidine (PEPCID) 20 MG tablet, Take 1 tablet (20 mg total) by mouth two (2) times a day., Disp: 60 tablet, Rfl: 0    gabapentin (NEURONTIN) 300 MG capsule, Take 2 capsules (600 mg total) by mouth Three (3) times a day., Disp: 180 capsule, Rfl: 0    ketorolac (TORADOL) 10 mg tablet, Take 1 tablet (10 mg total) by mouth every six (6) hours as needed for pain for up to 5 days., Disp: 20 tablet, Rfl: 0    loperamide (IMODIUM A-D) 2 mg tablet, Take 1 tablet (2 mg total) by mouth four (4) times a day as needed for diarrhea., Disp: 30 tablet, Rfl: 0    nystatin (MYCOSTATIN) 100,000 unit/gram powder, Apply 1 Application topically two (2) times a day., Disp: 60 g, Rfl: 1    ondansetron (ZOFRAN-ODT) 8 MG disintegrating tablet, Dissolve 1 tablet (8 mg total) in the mouth every eight (8) hours as needed for nausea., Disp: , Rfl:     oxybutynin (DITROPAN) 5 MG tablet, Take 1 tablet (5 mg total) by mouth Three (3) times a day., Disp: 90 tablet, Rfl: 11    phenazopyridine (PYRIDIUM) 100 MG tablet, Take 2 tablets (200 mg total) by mouth Three (3) times a day as needed for pain for up to 7 days., Disp: 21 tablet, Rfl: 0  Discharge Medication List as of 04/28/2023  2:37 AM        CONTINUE these medications which have NOT CHANGED    Details   acetaminophen (TYLENOL EXTRA STRENGTH) 500 MG tablet Take 2 tablets (1,000 mg total) by mouth every eight (8) hours for 7 days., Starting Sat 04/23/2023, Until Sat 04/30/2023, Normal      carboxymethylcellulose sodium (REFRESH CELLUVISC) 1 % DpGe Administer 1 drop to both eyes four (4) times a day., Starting Thu 01/13/2023, Normal      cetirizine (ZYRTEC) 10 MG tablet Take 1 tablet (10 mg total) by mouth daily as needed for allergies., Starting Fri 01/14/2023, Normal      cholecalciferol, vitamin D3 25 mcg, 1,000 units,, (CHOLECALCIFEROL-25 MCG, 1,000 UNIT,) 1,000 unit (25 mcg) tablet Take 1 tablet (25 mcg total) by mouth daily., Historical Med      famotidine (PEPCID) 20 MG tablet Take 1 tablet (20 mg total) by mouth two (2) times a day., Starting Wed 04/13/2023, Until Fri 05/13/2023, Normal      gabapentin (NEURONTIN) 300 MG capsule Take 2 capsules (600 mg total) by mouth Three (3) times a day., Starting Mon 04/18/2023, Until Wed 05/18/2023, Normal      ketorolac (TORADOL) 10 mg tablet Take 1 tablet (10 mg total) by mouth every six (6) hours as needed for pain for up to 5 days., Starting Sat 04/23/2023,  Until Thu 04/28/2023 at 2359, Normal      loperamide (IMODIUM A-D) 2 mg tablet Take 1 tablet (2 mg total) by mouth four (4) times a day as needed for diarrhea., Starting Tue 01/25/2023, Normal      nystatin (MYCOSTATIN) 100,000 unit/gram powder Apply 1 Application topically two (2) times a day., Starting Wed 02/23/2023, Until Thu 06/23/2023, Normal      ondansetron (ZOFRAN-ODT) 8 MG disintegrating tablet Dissolve 1 tablet (8 mg total) in the mouth every eight (8) hours as needed for nausea., Starting Wed 04/13/2023, Until Fri 05/13/2023 at 2359, No Print      oxybutynin (DITROPAN) 5 MG tablet Take 1 tablet (5 mg total) by mouth Three (3) times a day., Starting Sat 04/23/2023, Until Sun 04/22/2024, Normal      phenazopyridine (PYRIDIUM) 100 MG tablet Take 2 tablets (200 mg total) by mouth Three (3) times a day as needed for pain for up to 7 days., Starting Sat 04/23/2023, Until Sat 04/30/2023 at 2359, Normal           STOP taking these medications       ciprofloxacin HCl (CIPRO) 500 MG tablet Comments:   Reason for Stopping:         metoclopramide (REGLAN) 10 MG tablet Comments:   Reason for Stopping:               Allergies:   Blueberry, Peanut, and Tamsulosin    Past Surgical History:   Past Surgical History:   Procedure Laterality Date    PR CYSTO/URETERO/PYELOSCOPY W/LITHOTRIPSY Right 04/22/2023    Procedure: CYSTOURETHROSCOPY, WITH URETEROSCOPY &/OR PYELOSCOPY; WITH LITHOTRIPSY (URETERAL CATHETERIZATION INCLUDED);  Surgeon: Turner Daniels, MD;  Location: [facility] OR [facility];  Service: Urology    PR CYSTOSCOPY,INSERT URETERAL STENT Right 03/26/2023    Procedure: CYSTOURETHROSCOPY,  WITH INSERTION OF INDWELLING URETERAL STENT (EG, GIBBONS OR DOUBLE-J TYPE);  Surgeon: Annye English, MD;  Location: MAIN OR [facility];  Service: Urology    PR CYSTOURETHROSCOPY,URETER CATHETER Right 03/26/2023    Procedure: CYSTOURETHROSCOPY, W/URETERAL CATHETERIZATION, W/WO IRRIG, INSTILL, OR URETEROPYELOG, EXCLUS OF RADIOLG SVC;  Surgeon: Annye English, MD;  Location: MAIN OR [facility];  Service: Urology       Social History:   Social History     Tobacco Use    Smoking status: Every Day     Types: e-Cigarettes    Smokeless tobacco: Never    Tobacco comments:     Vapes   Substance Use Topics    Alcohol use: Not Currently       Family History:  Family History   Problem Relation Age of Onset    Thyroid disease Mother     Thyroid disease Maternal Grandmother     Thyroid disease Maternal Grandfather     Thyroid disease Paternal Grandmother     Vitiligo Paternal Grandmother         Physical Exam     Vital Signs:    BP 150/91  - Pulse 80  - Temp 36.9 ??C (98.5 ??F) (Oral)  - Resp 20  - SpO2 100%     General: Alert and oriented. Well-appearing and in no distress.  Skin: Skin is warm and well-perfused. No rashes noted.  HEENT: Normocephalic and atraumatic, moist mucous membranes, no nasal drainage, no intra-oral lesions or erythema.  Lungs: Normal respiratory effort. Breath sounds clear bilaterally without wheezes, crackles, or rhonchi.  Heart: Normal rate, regular rhythm. No murmurs appreciated.  Abdomen: Mild right-sided abdominal tenderness to  palpation without guarding or rebound.  Extremities: No peripheral edema or clubbing. Pulses are normal and symmetric in upper and lower extremities.  Neurological: Normal speech and language. No gross focal neurologic deficits are appreciated.  Psychiatric: Mood and affect are normal. Normal speech and behavior.     Radiology     XR Abdomen 1 View   Final Result   Right-sided double-J stent.      Nonobstructive bowel gas pattern.             Labs     Labs Reviewed   COMPREHENSIVE METABOLIC PANEL - Abnormal; Notable for the following components:       Result Value    Chloride 108 (*)     BUN 8 (*)     All other components within normal limits   CBC W/ AUTO DIFF - Abnormal; Notable for the following components:    WBC 10.6 (*)     Absolute Neutrophils 7.5 (*)     Absolute Monocytes 0.9 (*)     All other components within normal limits   URINALYSIS WITH MICROSCOPY WITH CULTURE REFLEX PERFORMABLE - Abnormal; Notable for the following components:    Leukocyte Esterase, UA Small (*)     Blood, UA Small (*)     RBC, UA 38 (*)     WBC, UA 6 (*)     Mucus, UA Rare (*)     All other components within normal limits   LIPASE - Normal   PREGNANCY, URINE - Normal   POCT GLUCOSE, INTERFACED - Normal   POCT GLUCOSE, INTERFACED - Normal   URINE CULTURE   CBC W/ DIFFERENTIAL    Narrative:     The following orders were created for panel order CBC w/ Differential.                  Procedure                               Abnormality         Status                                     ---------                               -----------         ------                                     CBC w/ Differential[[id number]]         Abnormal            Final result                                                 Please view results for these tests on the individual orders.   URINALYSIS WITH MICROSCOPY WITH CULTURE REFLEX    Narrative:     The following orders were created for panel order Urinalysis with Microscopy with Culture Reflex.  Procedure                               Abnormality         Status                                     ---------                               -----------         ------                                     Urinalysis with Microsc.Marland Kitchen.[[id number]]  Abnormal            Final result Please view results for these tests on the individual orders.     _____________________________________________________________________    Please note - This documentation was generated using dictation and/or voice recognition software, and as such, may contain spelling or other transcription errors. Any questions regarding the content of this documentation should be directed to the individual who electronically signed.     Tawni Levy, MD  Resident  04/28/23 [phone]

## 2023-04-28 NOTE — Unmapped (Signed)
BIB [facility] from home.   Endorsing Abd pain    Stent placed on Friday due to kidney stones.   New procedure.   MD advised she was to remove stent herself.  She attempted and started having pain.  So called 911.

## 2023-05-01 ENCOUNTER — Ambulatory Visit
Admit: 2023-05-01 | Discharge: 2023-05-02 | Disposition: A | Discharge status: Home / Self Care | Payer: PRIVATE HEALTH INSURANCE | Attending: Emergency Medicine

## 2023-05-01 DIAGNOSIS — R5383 Other fatigue: Principal | ICD-10-CM

## 2023-05-01 LAB — CBC W/ AUTO DIFF
BASOPHILS ABSOLUTE COUNT: 0.1 10*9/L (ref 0.0–0.1)
BASOPHILS RELATIVE PERCENT: 0.6 %
EOSINOPHILS ABSOLUTE COUNT: 0.2 10*9/L (ref 0.0–0.5)
EOSINOPHILS RELATIVE PERCENT: 1.8 %
HEMATOCRIT: 43.8 % (ref 34.0–44.0)
HEMOGLOBIN: 14.8 g/dL (ref 11.3–14.9)
LYMPHOCYTES ABSOLUTE COUNT: 2.3 10*9/L (ref 1.1–3.6)
LYMPHOCYTES RELATIVE PERCENT: 19.5 %
MEAN CORPUSCULAR HEMOGLOBIN CONC: 33.8 g/dL (ref 32.3–35.0)
MEAN CORPUSCULAR HEMOGLOBIN: 29.6 pg (ref 25.9–32.4)
MEAN CORPUSCULAR VOLUME: 87.6 fL (ref 77.6–95.7)
MEAN PLATELET VOLUME: 9.2 fL (ref 7.3–10.7)
MONOCYTES ABSOLUTE COUNT: 1.1 10*9/L — ABNORMAL HIGH (ref 0.3–0.8)
MONOCYTES RELATIVE PERCENT: 9.1 %
NEUTROPHILS ABSOLUTE COUNT: 8 10*9/L — ABNORMAL HIGH (ref 1.5–6.4)
NEUTROPHILS RELATIVE PERCENT: 69 %
NUCLEATED RED BLOOD CELLS: 0 /100{WBCs} (ref <=4)
PLATELET COUNT: 288 10*9/L (ref 170–380)
RED BLOOD CELL COUNT: 5 10*12/L (ref 3.95–5.13)
RED CELL DISTRIBUTION WIDTH: 13.3 % (ref 12.2–15.2)
WBC ADJUSTED: 11.6 10*9/L — ABNORMAL HIGH (ref 4.2–10.2)

## 2023-05-01 LAB — COMPREHENSIVE METABOLIC PANEL
ALBUMIN: 4 g/dL (ref 3.4–5.0)
ALKALINE PHOSPHATASE: 62 U/L (ref 46–116)
ALT (SGPT): 69 U/L — ABNORMAL HIGH (ref 10–49)
ANION GAP: 7 mmol/L (ref 5–14)
AST (SGOT): 32 U/L (ref <=34)
BILIRUBIN TOTAL: 0.4 mg/dL (ref 0.3–1.2)
BLOOD UREA NITROGEN: 7 mg/dL — ABNORMAL LOW (ref 9–23)
BUN / CREAT RATIO: 15
CALCIUM: 9.7 mg/dL (ref 8.7–10.4)
CHLORIDE: 108 mmol/L — ABNORMAL HIGH (ref 98–107)
CO2: 25.9 mmol/L (ref 20.0–31.0)
CREATININE: 0.48 mg/dL — ABNORMAL LOW
EGFR CKD-EPI (2021) FEMALE: >90 mL/min/{1.73_m2} (ref >=60)
GLUCOSE RANDOM: 116 mg/dL (ref 70–179)
POTASSIUM: 4.1 mmol/L (ref 3.4–4.8)
PROTEIN TOTAL: 7.7 g/dL (ref 5.7–8.2)
SODIUM: 141 mmol/L (ref 135–145)

## 2023-05-01 LAB — URINALYSIS WITH MICROSCOPY WITH CULTURE REFLEX PERFORMABLE
BILIRUBIN UA: NEGATIVE
GLUCOSE UA: NEGATIVE
KETONES UA: NEGATIVE
LEUKOCYTE ESTERASE UA: NEGATIVE
NITRITE UA: NEGATIVE
PH UA: 6.5 (ref 5.0–9.0)
PROTEIN UA: 30 — AB
RBC UA: 7 /HPF — ABNORMAL HIGH (ref <=4)
SPECIFIC GRAVITY UA: 1.022 (ref 1.003–1.030)
SQUAMOUS EPITHELIAL: 4 /HPF (ref 0–5)
UROBILINOGEN UA: <2
WBC UA: 1 /HPF (ref 0–5)

## 2023-05-01 LAB — MAGNESIUM: MAGNESIUM: 1.8 mg/dL (ref 1.6–2.6)

## 2023-05-02 DIAGNOSIS — Z9221 Personal history of antineoplastic chemotherapy: Principal | ICD-10-CM

## 2023-05-02 DIAGNOSIS — R748 Abnormal levels of other serum enzymes: Principal | ICD-10-CM

## 2023-05-02 MED ADMIN — lactated ringers bolus 1,000 mL: 1000 mL | INTRAVENOUS | Stop: 2023-05-01

## 2023-05-02 NOTE — Unmapped (Signed)
Pt is coming in with c/o having some chronic abd pain. She is coming in by EMS from home with the same issues as last week.

## 2023-05-02 NOTE — Unmapped (Signed)
[facility]  Emergency Department Provider Note     ED Clinical Impression     Final diagnoses:   Fatigue, unspecified type (Primary)      HPI, Medical Decision Making, ED Course     HPI: 22 y.o. female who has a past medical history of thyroid disease, multiple sclerosis, and kidney stones who presents with fatigue and abdominal pain. The patient reports 5 days of persistent abdominal pain with associated generalized fatigue, lightheadedness, and dizziness since removal of her ureteral stent on 07/31. Per chart review, on 04/27/2023, the patient presented to this ED for evaluation of abdominal pain in the setting of attempting to remove her R ureteral stent. Stent was removed by urology. Patient was advised to complete her previous prescription of Toradol for pain management. Currently, the patient states that she has taken meclizine for her dizziness with minimal relief. She does have dizziness at baseline because of her MS. Of note, the patient states she ambulates with rollator assistance. She denies syncope, dysuria, or hematuria.    BP 140/79  - Pulse 95  - Temp 37.1 ??C (98.7 ??F) (Oral)  - Resp 18  - SpO2 99%     On exam, patient is nontoxic appearing and in no acute distress. Vital signs are within normal limits; patient is hemodynamically stable, afebrile. Exam is overall benign and reassuring; HRRR, LCTAB, abdomen soft and non-tender.     Patient presents to the emergency department with multiple vague symptoms.  Differential diagnosis includes electrolyte derangement, anemia, dehydration, metabolic abnormality, UTI, versus other etiology.  Patient's neurologic exam is nonfocal and without new symptoms, do not suspect CVA, TIA, or MS flare.  Normal gait and ambulation.  Abdominal exam benign, do not suspect intra-abdominal pathology such as appendicitis, diverticulitis, or SBO.  No worsening back pain or urinary symptoms, do not suspect nephrolithiasis.  No fever, do not suspect infected stone or pyelonephritis.  Plan for basic labs, electrolytes, UA.  Will give fluids and reassess.    Diagnostic workup as below. Will treat patient with IV fluids.    Orders Placed This Encounter   Procedures    CBC w/ Differential    Comprehensive metabolic panel    Magnesium Level    Urinalysis with Microscopy with Culture Reflex       ED Course  ED Course as of 05/01/23 2252   Sun May 01, 2023   2048 CBC and CMP unremarkable.  No significant electrolyte derangement or AKI.   2049 UA negative for infection.   2206 Patient is ambulated without difficulty and tolerated p.o.  Her workup is overall reassuring.  Plan to discharge patient at this time.  Strict return precautions were discussed and all questions were answered.  Recommend follow-up with neurology and PCP.  Patient verbalizes understanding of this and agrees with this plan.       Discussion of Management with other Physicians, QHP or Appropriate Source: None  Independent Interpretation of Studies: If applicable, documented in ED course above.  I have reviewed recent and relevant previous record, including: Inpatient notes - 04/23/2023  Family Med discharge summary for patient's past medical history.  Escalation of Care including OBS/Admission/Transfer was considered: However, patient was determined to be appropriate for outpatient management.  Social Determinants that significantly affected care: N/A  Prescription drugs considered but not prescribed: Considered multiple medications including meclizine, scopolamine, and medications for nausea however patient declines.  Diagnostic tests considered but not performed: Considered additional imaging of the brain such as MRI  or CT head however patient's dizziness is at baseline and patient ambulates without difficulty, no new focal neurologic deficits, thus decision was made to hold off.  ____________________________________________    The case was discussed with the attending physician, who is in agreement with the above assessment and plan.     Additional History Elements     Chief Complaint  Chief Complaint   Patient presents with    Abdominal Pain     Additional Historian(s): none available    Past Medical History:   Diagnosis Date    Disease of thyroid gland     Multiple sclerosis (CMS-HCC)     Obesity     Vertigo        Past Surgical History:   Procedure Laterality Date    PR CYSTO/URETERO/PYELOSCOPY W/LITHOTRIPSY Right 04/22/2023    Procedure: CYSTOURETHROSCOPY, WITH URETEROSCOPY &/OR PYELOSCOPY; WITH LITHOTRIPSY (URETERAL CATHETERIZATION INCLUDED);  Surgeon: Turner Daniels, MD;  [facility];  Service: Urology    PR CYSTOSCOPY,INSERT URETERAL STENT Right 03/26/2023    Procedure: CYSTOURETHROSCOPY,  WITH INSERTION OF INDWELLING URETERAL STENT (EG, GIBBONS OR DOUBLE-J TYPE);  Surgeon: Annye English, MD;  Location: MAIN OR [facility];  Service: Urology    PR CYSTOURETHROSCOPY,URETER CATHETER Right 03/26/2023    Procedure: CYSTOURETHROSCOPY, W/URETERAL CATHETERIZATION, W/WO IRRIG, INSTILL, OR URETEROPYELOG, EXCLUS OF RADIOLG SVC;  Surgeon: Annye English, MD;  Location: MAIN OR [facility];  Service: Urology       Allergies  Blueberry, Peanut, and Tamsulosin    Family History  Family History   Problem Relation Age of Onset    Thyroid disease Mother     Thyroid disease Maternal Grandmother     Thyroid disease Maternal Grandfather     Thyroid disease Paternal Grandmother     Vitiligo Paternal Grandmother        Social History  Social History     Tobacco Use    Smoking status: Every Day     Types: e-Cigarettes    Smokeless tobacco: Never    Tobacco comments:     Vapes   Vaping Use    Vaping status: Former    Substances: Nicotine   Substance Use Topics    Alcohol use: Not Currently    Drug use: Never        Physical Exam     VITAL SIGNS:      Vitals:    05/01/23 1754 05/01/23 2228   BP: 130/68 140/79   Pulse: 92 95   Resp: 18 18   Temp: 36.9 ??C (98.4 ??F) 37.1 ??C (98.7 ??F)   TempSrc: Oral Oral   SpO2: 97% 99% Constitutional: Alert and oriented. No acute distress.  Eyes: Conjunctivae are normal.  HEENT: Normocephalic and atraumatic. Conjunctivae clear. No congestion. Moist mucous membranes.   Cardiovascular: Rate as above, regular rhythm. Normal and symmetric distal pulses. Brisk capillary refill. Normal skin turgor.  Respiratory: Normal respiratory effort. Breath sounds are normal. There are no wheezing or crackles heard.  Gastrointestinal: Soft, non-distended, non-tender.  Genitourinary: Deferred.  Musculoskeletal: Non-tender with normal range of motion in all extremities.  Neurologic: Normal speech and language. No gross focal neurologic deficits are appreciated. Patient is moving all extremities equally, face is symmetric at rest and with speech.  5 out of 5 strength in bilateral upper and lower extremities.  Sensation intact throughout.  Normal gait.  Annual nerves intact.  Skin: Skin is warm, dry and intact. No rash noted.  Psychiatric: Mood and affect are  normal. Speech and behavior are normal.     Radiology     No orders to display       Pertinent labs & imaging results that were available during my care of the patient were independently interpreted by me and considered in my medical decision making (see chart for details).    Portions of this record have been created using Scientist, clinical (histocompatibility and immunogenetics). Dictation errors have been sought, but may not have been identified and corrected.    Documentation assistance was provided by Johnathan Hausen and Bridget Hartshorn, Scribes, on May 01, 2023 at 7:10 PM for Suzzanne Cloud, MD.    Documentation assistance was provided by the scribe in my presence.  The documentation recorded by the scribe has been reviewed by me and accurately reflects the services I personally performed.       Suzzanne Cloud, MD  Resident  05/01/23 [phone]

## 2023-05-02 NOTE — Unmapped (Signed)
[facility] Neurology Triage Call Note:     Urgency: Semi-Urgent (within 48 hours)    Confirm preferred call back number: [phone]     Situation:     Brandi Flynn contacted the practice via My Chart message reporting extreme fatigue, weakness, dizziness, stomach pain, and back pain.  Went to [facility] ED last night. Urinalysis was negative.     Diagnosis: MS    During triage, patient reported Advised patient to have someone drive them to the Emergency Department or call 911. and Notified patient's provider urgently.    Does the patient have any of the following contributing problems?    URI symptoms: Patient denies.  UTI symptoms: Patient denies.  Skin infection symptoms: Patient denies.  Other infection: Patient denies.  Fever of other cause: Patient denies.   Overheating: Patient denies.  Increase in stress: Patient denies.  Change in sleep patterns: Patient denies.  Abdominal Symptoms: sharp pain on left abdomen and back, feels like previous kidney stones, went to ED last night, no UTI  LMP (for women): implant    Does the patient have any NEW OR WORSENING NEUROLOGICAL SYMPTOMS?     Visual problems: Left Eye, Double Vision, and Started about a week ago. intermittent  Eye Pain: Patient denies.  Speech or Swallowing concerns: No new or worsening symptoms reported.   Weakness: all over body but mostly in bilateral legs and Started about a week, getting work. constant  Excessive fatigue: Examples of fatigue: unable to stay awake during the day,   constant  Numbness: bilaterally legs, left leg and Started about a week ago. constant  Tingling: newly in left toes, left leg and Started about a week ago.  constant  Pain: lower back, abdomen, mainly on the left but some on the right, 6-10/10, and Started about a week ago. constant  Balance or Coordination problems: Dizziness, Incoordination of right and left leg, and Started about a week ago. constant  Bowel or Bladder Control problems: Bladder Control episode: having a hard time making it to the bathroom to urinate   constant  Memory, Concentration, or Mood changes: No new or worsening symptoms reported.   Gait changes: Change in walking pattern: using rollator as usually but needing help even when using it and Started about a week.  constant    Falls: No new or worsening symptoms reported.  Headaches: throbbing, sharp, location: front of head, Pain 6-8/10. , and Started about a week ago. constant  Seizure-like event: No new or worsening symptoms reported.    Other symptoms (please list): none    Current Immunotherapy: none at this time  New medications started recently: Yes Gabapentin and Toradol for pain   Received IV steroids in the past: No      Background:     Last Visit Date: Visit date not found    Next Visit Date: Visit date not found    Nursing Assessment/Recommendations:     Brandi Flynn is a 22 y.o. female who has new or worsening neurologic symptoms in the absence of infection, fever, stress, or overheating that has lasted for at least 24 hours: Unsure    Recommendations:  routed to provider.  Brandi Flynn will call or message the [facility] team should anything change or any new issues arise.

## 2023-05-02 NOTE — Unmapped (Signed)
See call with patient.

## 2023-05-04 NOTE — Unmapped (Signed)
-  Hi,        Patient has contacted the clinic in regards to the following symptom:     Symptoms: had a sudden onset    Flank / Side / Back Pain, wen to the ED last week to have sent removed, now she says she's having pain on her left side and also been having diarrhea for the past 3 days.    Please contact  Patient at [phone]      Thank you,  Gildardo Pounds   [facility]

## 2023-05-04 NOTE — Unmapped (Signed)
Urine dark yellow   < 8 oz water daily  Slight dysuria  Dizzy  Now left flank pain  Unable to lie on back or side  Feels like stone pain  This pain started the day after her stent was pulled.     Recommend :  Proceed to ED    Pt states she spoke to her MS doctor who recommended she go to hospital also   She was waiting for me to call her back.     Elvina Sidle, LPN

## 2023-05-06 ENCOUNTER — Ambulatory Visit
Admit: 2023-05-06 | Discharge: 2023-05-07 | Disposition: A | Discharge status: Home / Self Care | Payer: PRIVATE HEALTH INSURANCE

## 2023-05-06 ENCOUNTER — Emergency Department
Admit: 2023-05-06 | Discharge: 2023-05-07 | Disposition: A | Discharge status: Home / Self Care | Payer: PRIVATE HEALTH INSURANCE

## 2023-05-06 DIAGNOSIS — R109 Unspecified abdominal pain: Principal | ICD-10-CM

## 2023-05-06 LAB — MAGNESIUM: MAGNESIUM: 1.9 mg/dL (ref 1.6–2.6)

## 2023-05-06 LAB — URINALYSIS WITH MICROSCOPY WITH CULTURE REFLEX PERFORMABLE
BACTERIA: NONE SEEN /HPF
BILIRUBIN UA: NEGATIVE
BILIRUBIN UA: NEGATIVE
GLUCOSE UA: NEGATIVE
GLUCOSE UA: NEGATIVE
KETONES UA: NEGATIVE
LEUKOCYTE ESTERASE UA: NEGATIVE
NITRITE UA: NEGATIVE
NITRITE UA: NEGATIVE
PH UA: 6 (ref 5.0–9.0)
PH UA: 6.5 (ref 5.0–9.0)
PROTEIN UA: 50 — AB
PROTEIN UA: 30 — AB
RBC UA: 5 /HPF — ABNORMAL HIGH (ref <=4)
RBC UA: 5 /HPF — ABNORMAL HIGH (ref <=4)
SPECIFIC GRAVITY UA: 1.031 — ABNORMAL HIGH (ref 1.003–1.030)
SPECIFIC GRAVITY UA: 1.2 — ABNORMAL HIGH (ref 1.003–1.030)
SQUAMOUS EPITHELIAL: 8 /HPF — ABNORMAL HIGH (ref 0–5)
SQUAMOUS EPITHELIAL: 4 /HPF (ref 0–5)
UROBILINOGEN UA: <2
UROBILINOGEN UA: <2
WBC UA: 13 /HPF — ABNORMAL HIGH (ref 0–5)
WBC UA: 4 /HPF (ref 0–5)

## 2023-05-06 LAB — COMPREHENSIVE METABOLIC PANEL
ALBUMIN: 4.2 g/dL (ref 3.4–5.0)
ALKALINE PHOSPHATASE: 65 U/L (ref 46–116)
ALT (SGPT): 68 U/L — ABNORMAL HIGH (ref 10–49)
ANION GAP: 6 mmol/L (ref 5–14)
AST (SGOT): 38 U/L — ABNORMAL HIGH (ref <=34)
BILIRUBIN TOTAL: 0.5 mg/dL (ref 0.3–1.2)
BLOOD UREA NITROGEN: 8 mg/dL — ABNORMAL LOW (ref 9–23)
BUN / CREAT RATIO: 15
CALCIUM: 9.8 mg/dL (ref 8.7–10.4)
CHLORIDE: 106 mmol/L (ref 98–107)
CO2: 27.7 mmol/L (ref 20.0–31.0)
CREATININE: 0.54 mg/dL — ABNORMAL LOW
EGFR CKD-EPI (2021) FEMALE: >90 mL/min/{1.73_m2} (ref >=60)
GLUCOSE RANDOM: 108 mg/dL (ref 70–179)
POTASSIUM: 3.6 mmol/L (ref 3.4–4.8)
PROTEIN TOTAL: 8.1 g/dL (ref 5.7–8.2)
SODIUM: 140 mmol/L (ref 135–145)

## 2023-05-06 LAB — CBC W/ AUTO DIFF
BASOPHILS ABSOLUTE COUNT: 0.1 10*9/L (ref 0.0–0.1)
BASOPHILS RELATIVE PERCENT: 0.7 %
EOSINOPHILS ABSOLUTE COUNT: 0.2 10*9/L (ref 0.0–0.5)
EOSINOPHILS RELATIVE PERCENT: 1.7 %
HEMATOCRIT: 45 % — ABNORMAL HIGH (ref 34.0–44.0)
HEMOGLOBIN: 15.1 g/dL — ABNORMAL HIGH (ref 11.3–14.9)
LYMPHOCYTES ABSOLUTE COUNT: 2.1 10*9/L (ref 1.1–3.6)
LYMPHOCYTES RELATIVE PERCENT: 20.5 %
MEAN CORPUSCULAR HEMOGLOBIN CONC: 33.4 g/dL (ref 32.3–35.0)
MEAN CORPUSCULAR HEMOGLOBIN: 29.4 pg (ref 25.9–32.4)
MEAN CORPUSCULAR VOLUME: 87.9 fL (ref 77.6–95.7)
MEAN PLATELET VOLUME: 9 fL (ref 7.3–10.7)
MONOCYTES ABSOLUTE COUNT: 0.9 10*9/L — ABNORMAL HIGH (ref 0.3–0.8)
MONOCYTES RELATIVE PERCENT: 9 %
NEUTROPHILS ABSOLUTE COUNT: 6.8 10*9/L — ABNORMAL HIGH (ref 1.5–6.4)
NEUTROPHILS RELATIVE PERCENT: 68.1 %
NUCLEATED RED BLOOD CELLS: 0 /100{WBCs} (ref <=4)
PLATELET COUNT: 270 10*9/L (ref 170–380)
RED BLOOD CELL COUNT: 5.12 10*12/L (ref 3.95–5.13)
RED CELL DISTRIBUTION WIDTH: 13.3 % (ref 12.2–15.2)
WBC ADJUSTED: 10 10*9/L (ref 4.2–10.2)

## 2023-05-06 LAB — PREGNANCY, URINE: PREGNANCY TEST URINE: NEGATIVE

## 2023-05-06 LAB — LIPASE: LIPASE: 44 U/L (ref 12–53)

## 2023-05-06 NOTE — Unmapped (Signed)
Pt is coming in with c/o haivng some chronic abd pain for months. She has been seen multiple times between the [facility] ED and [facility] ED. States the pain is not getting any better.

## 2023-05-07 MED ADMIN — lactated ringers bolus 1,000 mL: 1000 mL | INTRAVENOUS | Stop: 2023-05-06

## 2023-05-07 MED ADMIN — iohexol (OMNIPAQUE) 350 mg iodine/mL solution 100 mL: 100 mL | INTRAVENOUS | Stop: 2023-05-06

## 2023-05-07 NOTE — Unmapped (Signed)
[facility]  Emergency Department Provider Note     ED Clinical Impression     Final diagnoses:   Abdominal pain, unspecified abdominal location (Primary)      Impression, Medical Decision Making, ED Course     Impression: 22 y.o. female who has a past medical history of Disease of thyroid gland, Multiple sclerosis (CMS-HCC), Obesity, and Vertigo. who presents with weeks of abdominal pain, which oscillates between being left-sided and generalized in nature, with 1 week of multiple NBNB emesis episodes and non-bloody diarrhea episodes as described below.     On exam, patient is nontoxic appearing and in no acute distress. Vital signs are WNL. Patient is hemodynamically stable and afebrile. Exam is     DDx/MDM: Given the broad differential of abdominal pain, will obtain basic labs.  Lipase to evaluate for pancreatitis.  CBC to evaluate for leukocytosis or anemia.  Chemistry to evaluate for electrolyte abnormalities, acidosis or alkalosis, and liver function test to evaluate for hepato-biliary disease.  Urinalysis to evaluate for cystitis, pyelonephritis, hematuria suggest renal colic.  She had decision made discussion with patient regarding advanced imaging, patient like to proceed with imaging at this will proceed with CT abdomen pelvis.  Will administer fluids, patient declining pain medication at this time.    Diagnostic workup as below.     Orders Placed This Encounter   Procedures    GI Pathogen Panel (instead of Stool O&P)    Urine Culture    CT Abdomen Pelvis W IV Contrast    Magnesium Level    Pregnancy Qualitative, Urine    Urinalysis with Microscopy with Culture Reflex    Lipase Level    Comprehensive Metabolic Panel    CBC w/ Differential    Urinalysis with Microscopy with Culture Reflex       ED Course as of 05/08/23 0728   Fri May 06, 2023   2008 CT Abdomen Pelvis W IV Contrast  IMPRESSION:     Hepatomegaly with moderate to severe hepatic steatosis.     No acute abdominal pathology to explain the patient's abdominal pain.     2224 Urinalysis with Microscopy with Culture Reflex(!):    Color, UA Light Yellow   Clarity, UA Clear   Spec Grav, UA 1.200(!)   pH, UA 6.5   Leukocyte Esterase, UA Negative   Nitrite, UA Negative   Protein, UA 30 mg/dL(!)   Glucose, UA Negative   Ketones, UA Trace(!)   Urobilinogen, UA <2.0 mg/dL   Bilirubin, UA Negative   Blood, UA Trace(!)   RBC, UA 5(!)   WBC, UA 4   Squam Epithel, UA 4   Bacteria, UA None Seen   Mucus, UA Rare(!)  No evidence of infection   2256 Patient stable for discharge.  Discussed discharge instructions precautions fully with patient expressed understanding.  Patient is tolerating p.o.    Overall reassuring workup CBC with no significant leukocytosis, hemoglobin 15.1.  Magnesium is 44, CMP with mild elevation of AST/ALT at 38 and 64. Negative preg.  UA reassuring.  Patient reports that better, continue to politely declined any additional medication.       MDM Elements  Discussion of Management with other Physicians, QHP or Appropriate Source: None  Independent Interpretation of Studies: CT scan(s) - CT A/P  I have reviewed recent and relevant previous record, including: None available  Social Determinants that significantly affected care: None  ____________________________________________    The case was discussed with the attending physician, who is  in agreement with the above assessment and plan.      History     Chief Complaint  Chief Complaint   Patient presents with    Abdominal Pain     HPI   Brandi Flynn is a 22 y.o. female with past medical history as below who presents with abdominal pain. The patient states she has had weeks of abdominal pain, which oscillates between being left-sided and generalized in nature, with 1 week of multiple NBNB emesis episodes and non-bloody diarrhea episodes. She states that she has 4-5 diarrhea episodes daily. she states her emesis is just liquid, despite no change in the patient's baseline PO intake. She uses a rollator to ambulate at baseline due to MS. Denies dysuria or pain with bowel movements.  Denies chest pain, shortness of breath fever, urinary symptoms, vaginal bleeding or discharge.     Per chart review, the patient was seen at the [facility] ED on 05/01/2023, for 5 days of persistent abdominal pain after removal of her ureteral stent on 04/27/2023 by Urology.     Outside Historian(s): I have obtained additional history/collateral from N/A.    Past Medical History:   Diagnosis Date    Disease of thyroid gland     Multiple sclerosis (CMS-HCC)     Obesity     Vertigo        Past Surgical History:   Procedure Laterality Date    PR CYSTO/URETERO/PYELOSCOPY W/LITHOTRIPSY Right 04/22/2023    Procedure: CYSTOURETHROSCOPY, WITH URETEROSCOPY &/OR PYELOSCOPY; WITH LITHOTRIPSY (URETERAL CATHETERIZATION INCLUDED);  Surgeon: Turner Daniels, MD;  Location: [facility] OR [facility];  Service: Urology    PR CYSTOSCOPY,INSERT URETERAL STENT Right 03/26/2023    Procedure: CYSTOURETHROSCOPY,  WITH INSERTION OF INDWELLING URETERAL STENT (EG, GIBBONS OR DOUBLE-J TYPE);  Surgeon: Annye English, MD;  Location: MAIN OR [facility];  Service: Urology    PR CYSTOURETHROSCOPY,URETER CATHETER Right 03/26/2023    Procedure: CYSTOURETHROSCOPY, W/URETERAL CATHETERIZATION, W/WO IRRIG, INSTILL, OR URETEROPYELOG, EXCLUS OF RADIOLG SVC;  Surgeon: Annye English, MD;  Location: MAIN OR [facility];  Service: Urology       No current facility-administered medications for this encounter.    Current Outpatient Medications:     carboxymethylcellulose sodium (REFRESH CELLUVISC) 1 % DpGe, Administer 1 drop to both eyes four (4) times a day., Disp: 1 each, Rfl: 0    cetirizine (ZYRTEC) 10 MG tablet, Take 1 tablet (10 mg total) by mouth daily as needed for allergies., Disp: 30 tablet, Rfl: 0    cholecalciferol, vitamin D3 25 mcg, 1,000 units,, (CHOLECALCIFEROL-25 MCG, 1,000 UNIT,) 1,000 unit (25 mcg) tablet, Take 1 tablet (25 mcg total) by mouth daily., Disp: , Rfl:     famotidine (PEPCID) 20 MG tablet, Take 1 tablet (20 mg total) by mouth two (2) times a day., Disp: 60 tablet, Rfl: 0    gabapentin (NEURONTIN) 300 MG capsule, Take 2 capsules (600 mg total) by mouth Three (3) times a day., Disp: 180 capsule, Rfl: 0    loperamide (IMODIUM A-D) 2 mg tablet, Take 1 tablet (2 mg total) by mouth four (4) times a day as needed for diarrhea., Disp: 30 tablet, Rfl: 0    nystatin (MYCOSTATIN) 100,000 unit/gram powder, Apply 1 Application topically two (2) times a day., Disp: 60 g, Rfl: 1    ondansetron (ZOFRAN-ODT) 8 MG disintegrating tablet, Dissolve 1 tablet (8 mg total) in the mouth every eight (8) hours as needed for nausea., Disp: , Rfl:  oxybutynin (DITROPAN) 5 MG tablet, Take 1 tablet (5 mg total) by mouth Three (3) times a day., Disp: 90 tablet, Rfl: 11    Allergies  Blueberry, Peanut, and Tamsulosin    Family History  Family History   Problem Relation Age of Onset    Thyroid disease Mother     Thyroid disease Maternal Grandmother     Thyroid disease Maternal Grandfather     Thyroid disease Paternal Grandmother     Vitiligo Paternal Grandmother        Social History  Social History     Tobacco Use    Smoking status: Every Day     Types: e-Cigarettes    Smokeless tobacco: Never    Tobacco comments:     Vapes   Vaping Use    Vaping status: Former    Substances: Nicotine   Substance Use Topics    Alcohol use: Not Currently    Drug use: Never      Physical Exam     VITAL SIGNS:      Vitals:    05/06/23 1609 05/06/23 2029   BP: 145/90 139/79   Pulse: 98 88   Resp: 16 18   Temp: 36.6 ??C (97.9 ??F) 36.9 ??C (98.5 ??F)   TempSrc: Oral Oral   SpO2:  99%   Weight: (!) 140.2 kg (309 lb)      Constitutional: Alert and oriented. No acute distress.  Eyes: Conjunctivae are normal.  HEENT: Normocephalic and atraumatic. Conjunctivae clear. No congestion. Moist mucous membranes.   Cardiovascular: Rate as above, regular rhythm. Normal and symmetric distal pulses. Brisk capillary refill. Normal skin turgor.  Respiratory: Normal respiratory effort. Breath sounds are normal. There are no wheezing or crackles heard.  Gastrointestinal: Soft, non-distended, mild diffuse tenderness palpation  Genitourinary: Deferred.  Musculoskeletal: Non-tender with normal range of motion in all extremities.  Neurologic: Normal speech and language. No gross focal neurologic deficits are appreciated. Patient is moving all extremities equally, face is symmetric at rest and with speech.  Skin: Skin is warm, dry and intact. No rash noted.  Psychiatric: Mood and affect are normal. Speech and behavior are normal.     Radiology     CT Abdomen Pelvis W IV Contrast   Final Result      Hepatomegaly with moderate to severe hepatic steatosis.      No acute abdominal pathology to explain the patient's abdominal pain.        Pertinent labs & imaging results that were available during my care of the patient were independently interpreted by me and considered in my medical decision making (see chart for details).    Portions of this record have been created using Scientist, clinical (histocompatibility and immunogenetics). Dictation errors have been sought, but may not have been identified and corrected.    Documentation assistance was provided by Ricka Burdock, Scribe on May 06, 2023 at 18:32 for Cheryle Horsfall, DO.     Documentation assistance provided by the above mentioned scribe. I was present during the time the encounter was recorded. The information recorded by the scribe was done at my direction and has been reviewed and validated by me.        Cheryle Horsfall, MD  Resident  05/08/23 [phone]

## 2023-05-10 ENCOUNTER — Telehealth: Admit: 2023-05-10 | Discharge: 2023-05-11 | Payer: PRIVATE HEALTH INSURANCE

## 2023-05-10 ENCOUNTER — Ambulatory Visit
Admit: 2023-05-10 | Discharge: 2023-05-10 | Disposition: A | Discharge status: Home / Self Care | Payer: PRIVATE HEALTH INSURANCE | Attending: Student in an Organized Health Care Education/Training Program

## 2023-05-10 DIAGNOSIS — N3941 Urge incontinence: Principal | ICD-10-CM

## 2023-05-10 DIAGNOSIS — R197 Diarrhea, unspecified: Principal | ICD-10-CM

## 2023-05-10 DIAGNOSIS — J101 Influenza due to other identified influenza virus with other respiratory manifestations: Principal | ICD-10-CM

## 2023-05-10 DIAGNOSIS — R300 Dysuria: Principal | ICD-10-CM

## 2023-05-10 DIAGNOSIS — B354 Tinea corporis: Principal | ICD-10-CM

## 2023-05-10 DIAGNOSIS — M791 Myalgia, unspecified site: Principal | ICD-10-CM

## 2023-05-10 DIAGNOSIS — R112 Nausea with vomiting, unspecified: Principal | ICD-10-CM

## 2023-05-10 DIAGNOSIS — J069 Acute upper respiratory infection, unspecified: Principal | ICD-10-CM

## 2023-05-10 MED ORDER — FLUCONAZOLE 150 MG TABLET
ORAL_TABLET | ORAL | 0 refills | 28 days | Status: CP
Start: 2023-05-10 — End: ?

## 2023-05-10 MED ORDER — PROMETHAZINE 25 MG RECTAL SUPPOSITORY
Freq: Four times a day (QID) | RECTAL | 1 refills | 6 days | Status: CP | PRN
Start: 2023-05-10 — End: 2023-06-09

## 2023-05-10 MED ORDER — ONDANSETRON 4 MG DISINTEGRATING TABLET
ORAL_TABLET | Freq: Three times a day (TID) | 0 refills | 5 days | Status: CP | PRN
Start: 2023-05-10 — End: 2023-05-17

## 2023-05-10 MED ORDER — OSELTAMIVIR 75 MG CAPSULE
ORAL_CAPSULE | Freq: Two times a day (BID) | ORAL | 0 refills | 5 days | Status: CP
Start: 2023-05-10 — End: 2023-05-15

## 2023-05-10 MED ADMIN — ondansetron (ZOFRAN-ODT) disintegrating tablet 4 mg: 4 mg | ORAL | @ 22:00:00 | Stop: 2023-05-10

## 2023-05-10 NOTE — Unmapped (Signed)
-  Resume oxybutynin  -Discussed your symptoms of urge incontinence with your urologist during your next appointment in September

## 2023-05-10 NOTE — Unmapped (Signed)
-  Please went to ER for additional evaluation of your upper respiratory infection symptoms

## 2023-05-10 NOTE — Unmapped (Signed)
-  Please went to ER for additional evaluation and IV hydration with symptoms of nausea and vomiting  -You may try Phenergan rectal suppository for the symptoms of nausea  -May continue Zofran

## 2023-05-10 NOTE — Unmapped (Signed)
[facility] Visit      The patient reports they are physically located in [place] and is currently: at home. I conducted a audio/video visit. I spent 25 minutes on the video call with the patient. I spent an additional 25 minutes on pre- and post-visit activities on the date of service .       Assessment/Plan:      Diagnoses and all orders for this visit:    Nausea and vomiting, unspecified vomiting type  Assessment & Plan:  -Please went to ER for additional evaluation and IV hydration with symptoms of nausea and vomiting  -You may try Phenergan rectal suppository for the symptoms of nausea  -May continue Zofran    Orders:  -     promethazine (PHENERGAN) 25 MG suppository; Insert 1 suppository (25 mg total) into the rectum every six (6) hours as needed for nausea.    Acute diarrhea    Upper respiratory tract infection, unspecified type  Assessment & Plan:  -Please went to ER for additional evaluation of your upper respiratory infection symptoms        Tinea corporis  Assessment & Plan:  Take Diflucan as directed  -Referral to dermatology    Orders:  -     fluconazole (DIFLUCAN) 150 MG tablet; Take 1 tablet (150 mg total) by mouth once a week.  -     Ambulatory referral to Dermatology; Future    Urge incontinence of urine  Assessment & Plan:  -Resume oxybutynin  -Discussed your symptoms of urge incontinence with your urologist during your next appointment in September      Dysuria  Assessment & Plan:  - Your  abnormal urinary symptoms such as dysuria,  unpleasant smell with urination and urinary urgency concerning for bacterial cystitis or pyelonephritis.   -Your recent history of pyelonephritis, fever, and abnormal urinary symptoms . are very concerning   You need to be evaluated in ER as soon as possible.               Return in about 1 week (around 05/17/2023) for Recheck.     Subjective:     HPI:    Patient reports several health concerns today  1) upper respiratory infection. Reports for the last 2 days developed symptoms of sore throat, dry cough, chills, body aches and fever.  Reports temperature 100.7 this morning.  Patient denies recent contact with anyone with similar symptoms.  Reports that cough wakes her up at night.  Describes her cough is mostly dry.    2) Fatigue-patient has a symptoms of chronic fatigue , reports that recently (in the last 7 days) her symptoms of fatigue became worse.  Patient experiencing occasional dizziness.  Patient denies any recent falls    4) Abnormal urinary symptoms-patient reports that 10 days ago developed symptoms of dysuria and urinary frequency.  Reports that symptoms of dysuria became slightly worse, in addition 2 days ago patient developed abnormal, unpleasant odor with urination.  Patient denies back pain, denies blood in the urine.    5) urinary incontinence-patient reports urge incontinence for the last 2 weeks.  Patient used to take oxybutynin 3 times a day.  Patient stopped taking oxybutynin after stent removal.  Patient did not contact urology about the new onset of incontinence.  Recently patient had kidney stone that was complicated with hydronephrosis and stent placement.      6) Nausea and vomiting.  Patient reports that she developed nausea and  vomiting over 10 days ago.  Patient went to emergency room twice for the symptoms of abdominal pain nausea vomiting and diarrhea.  Reports no improvement in her symptoms of nausea and vomiting.  Patient vomited 3 times yesterday, vomited once today.  Reports poor appetite.  Reports that it is hard to keep liquids and solid food.  Patient takes Zofran ODT 8 mg with some improvement of her nausea symptoms.    7) Diarrhea.  Patient reports that she continues to have diarrhea.  Had 5 episodes yesterday and 1 episode of diarrhea today.  Patient denies blood in her stool, describes her diarrhea as watery.  Patient tried Imodium-no improvement with your symptoms.  Recently patient took several rounds of antibiotics due to pyelonephritis and kidney stones.  And patient was in ER on 05/06/2023 C. difficile stool culture was not collected.     8) Worsening of fungal infection.  Patient is a chronic fungal infection and skin folds.  This time patient developed erythema edema and severe pruritus in the left arm.  Patient reports that erythema of the skin was present for several weeks and is getting progressively worse.  Patient tried nystatin topical powder-with very mild improvement of his symptoms.  Patient reports that skin is intact, denies presence of any boils in her armpits.         Review of systems negative unless otherwise noted as per HPI.    Objective:     There were no vitals taken for this visit.    PHYSICAL EXAM:    As part of this Video visit, no in-person exam was conducted, and no vital signs were obtained. Video interaction permitted the following observations:    GEN: no acute distress  SKIN: color is normal. There is a a wide area with erythema and skin thickening present underneath left armpit   PSYCH: appropriate affect, normal mood  RESP: normal work of breathing, no respiratory distress    [name], FNP  Note - This record has been created using [employer]. Chart creation errors have been sought, but may not always have been located. Such creation errors do not reflect on the standard of medical care.

## 2023-05-10 NOTE — Unmapped (Signed)
-   Your  abnormal urinary symptoms such as dysuria,  unpleasant smell with urination and urinary urgency concerning for bacterial cystitis or pyelonephritis.   -Your recent history of pyelonephritis, fever, and abnormal urinary symptoms . are very concerning   You need to be evaluated in ER as soon as possible.

## 2023-05-10 NOTE — Unmapped (Signed)
BIB [facility] from home for N/V/D x 2 days. Seen by PCP virtually today and told to come to ED for covid/flu swab and called EMS for transport.

## 2023-05-10 NOTE — Unmapped (Signed)
Take Diflucan as directed  -Referral to dermatology

## 2023-05-11 NOTE — Unmapped (Signed)
[facility]  Emergency Department Provider Note      ED Clinical Impression      Final diagnoses:   Myalgia   Influenza B (Primary)            Impression, Medical Decision Making, Progress Notes and Critical Care      Impression, Differential Diagnosis and Plan of Care    Viral syndrome less likely pneumonia, dehydration, hypoglycemia, MS flare    Patient is a 22 year old female who presents with persistent nausea with vomiting, sore throat and muscle aches.  This is day 3 of her symptoms.  Suspect she has a viral syndrome.  Swab testing positive for influenza B.  She is provided Zofran to help keep medications and fluids down over the next few days.  He voices understanding of ED return precautions.    Independent Interpretation of Studies    I have independently interpreted the following studies:  None    Discussion of Management with other Providers or Support Staff    I discussed the management of this patient with the:  None    Considerations Regarding Disposition/Escalation of Care and Critical Care    Indications for observation/admission (or consideration of observation/admission) and/or appropriateness for outpatient management: None  Patient/Family/Caregiver Discussions: Expectant management of symptoms.  Appropriate Tamiflu use.  ED return precautions  Diagnostic Tests Considered But Not Done: Chest x-ray  Prescription Drugs Provided or Considered But Not Given: Tamiflu, Zofran  Social Determinants of Health which significantly affected care: None    Additional Progress Notes           Portions of this record have been created using [employer], clinical (histocompatibility and immunogenetics). Dictation errors have been sought, but may not have been identified and corrected.    See chart and nursing documentation for additional details.    ____________________________________________         History        Reason for Visit  Emesis      HPI   Regine Shatona Quintas is a 22 y.o. female PMH of thyroid disease, MS, hypoglycemia who presents with upper respiratory symptoms and, myalgias.  Patient reports day 3 of throat irritation and dry cough, myalgias, nausea with nonbloody nonbilious emesis and fever of 100.7.  There is also chest tightness.  She utilized ibuprofen with resolution of fever.  She was evaluated virtually by PCP who recommended COVID testing.  Patient denies head pain, shortness of breath, new abdominal pain or change in urine or stool characteristics.  She denies known sick contacts or recent travel.    Outside Historian(s)  (EMS, Significant Other, Family, Parent, Caregiver, Friend, Patent examiner, etc.)    None    External Records Reviewed  (Inpatient/Outpatient notes, Prior labs/imaging studies, Care Everywhere, PDMP, External ED notes, etc)    None    Past Medical History:   Diagnosis Date    Disease of thyroid gland     Multiple sclerosis (CMS-HCC)     Obesity     Vertigo        Patient Active Problem List   Diagnosis    Tobacco use disorder    Multiple sclerosis (CMS-HCC)    Demyelinating disease (CMS-HCC)    Blepharitis of both eyes    Dry eye syndrome of both eyes    Obesity    Skin infection    Kidney stones    Pyelonephritis    Nausea & vomiting    Acute diarrhea    Upper respiratory tract infection  Tinea corporis    Urge incontinence of urine    Dysuria       Past Surgical History:   Procedure Laterality Date    PR CYSTO/URETERO/PYELOSCOPY W/LITHOTRIPSY Right 04/22/2023    Procedure: CYSTOURETHROSCOPY, WITH URETEROSCOPY &/OR PYELOSCOPY; WITH LITHOTRIPSY (URETERAL CATHETERIZATION INCLUDED);  Surgeon: Turner Daniels, MD;  Location: [facility];  Service: Urology    PR CYSTOSCOPY,INSERT URETERAL STENT Right 03/26/2023    Procedure: CYSTOURETHROSCOPY,  WITH INSERTION OF INDWELLING URETERAL STENT (EG, GIBBONS OR DOUBLE-J TYPE);  Surgeon: Annye English, MD;  Location: MAIN OR [facility];  Service: Urology    PR CYSTOURETHROSCOPY,URETER CATHETER Right 03/26/2023    Procedure: CYSTOURETHROSCOPY, W/URETERAL CATHETERIZATION, W/WO IRRIG, INSTILL, OR URETEROPYELOG, EXCLUS OF RADIOLG SVC;  Surgeon: Annye English, MD;  Location: MAIN OR [facility];  Service: Urology       No current facility-administered medications for this encounter.    Current Outpatient Medications:     carboxymethylcellulose sodium (REFRESH CELLUVISC) 1 % DpGe, Administer 1 drop to both eyes four (4) times a day., Disp: 1 each, Rfl: 0    cholecalciferol, vitamin D3 25 mcg, 1,000 units,, (CHOLECALCIFEROL-25 MCG, 1,000 UNIT,) 1,000 unit (25 mcg) tablet, Take 1 tablet (25 mcg total) by mouth daily., Disp: , Rfl:     famotidine (PEPCID) 20 MG tablet, Take 1 tablet (20 mg total) by mouth two (2) times a day., Disp: 60 tablet, Rfl: 0    fluconazole (DIFLUCAN) 150 MG tablet, Take 1 tablet (150 mg total) by mouth once a week., Disp: 4 tablet, Rfl: 0    gabapentin (NEURONTIN) 300 MG capsule, Take 2 capsules (600 mg total) by mouth Three (3) times a day., Disp: 180 capsule, Rfl: 0    loperamide (IMODIUM A-D) 2 mg tablet, Take 1 tablet (2 mg total) by mouth four (4) times a day as needed for diarrhea., Disp: 30 tablet, Rfl: 0    nystatin (MYCOSTATIN) 100,000 unit/gram powder, Apply 1 Application topically two (2) times a day., Disp: 60 g, Rfl: 1    ondansetron (ZOFRAN-ODT) 4 MG disintegrating tablet, Dissolve 1 tablet (4 mg total) in the mouth every eight (8) hours as needed for nausea for up to 7 days., Disp: 15 tablet, Rfl: 0    oseltamivir (TAMIFLU) 75 MG capsule, Take 1 capsule (75 mg total) by mouth two (2) times a day for 5 days., Disp: 10 capsule, Rfl: 0    oxybutynin (DITROPAN) 5 MG tablet, Take 1 tablet (5 mg total) by mouth Three (3) times a day., Disp: 90 tablet, Rfl: 11    promethazine (PHENERGAN) 25 MG suppository, Insert 1 suppository (25 mg total) into the rectum every six (6) hours as needed for nausea., Disp: 24 suppository, Rfl: 1    Allergies  Blueberry, Peanut, and Tamsulosin    Family History   Problem Relation Age of Onset    Thyroid disease Mother     Thyroid disease Maternal Grandmother     Thyroid disease Maternal Grandfather     Thyroid disease Paternal Grandmother     Vitiligo Paternal Grandmother        Social History  Social History     Tobacco Use    Smoking status: Every Day     Types: e-Cigarettes    Smokeless tobacco: Never    Tobacco comments:     Vapes   Vaping Use    Vaping status: Former    Substances: Nicotine   Substance Use Topics    Alcohol use:  Not Currently    Drug use: Never          Physical Exam     This provider entered the patient's room: Yes:    If this provider did enter the patient room, the following was PPE worn: N95, eye protection and gloves     ED Triage Vitals [05/10/23 1638]   Enc Vitals Group      BP 129/82      Heart Rate 113      SpO2 Pulse       Resp 20      Temp 36.9 ??C (98.5 ??F)      Temp Source Oral      SpO2 97 %      Weight       Height       Head Circumference       Peak Flow       Pain Score       Pain Loc       Pain Education       Exclude from Growth Chart        Constitutional: Alert and oriented. Well appearing and in no distress.  Eyes: Conjunctivae are normal.  ENT       Head: Normocephalic and atraumatic.       Nose: No congestion.       Mouth/Throat: Mucous membranes are moist.  3+ tonsils bilaterally.  No erythema.       Neck: No stridor.  Hematological/Lymphatic/Immunilogical: Cervical lymphadenopathy.  Cardiovascular: Normal rate, regular rhythm. Normal and symmetric distal pulses are present in all extremities.  Respiratory: Normal respiratory effort. Breath sounds are normal.  Gastrointestinal: Soft and nontender. There is no CVA tenderness.  Genitourinary: Deferred  Musculoskeletal: Normal range of motion in all extremities.       Right lower leg: No tenderness or edema.       Left lower leg: No tenderness or edema.  Neurologic: Normal speech and language. No gross focal neurologic deficits are appreciated.  Skin: Skin is warm, dry and intact. No rash noted.  Psychiatric: Mood and affect are normal. Speech and behavior are normal.       Radiology     No orders to display         Procedures     None               Ann Lions, [place]  05/10/23 1927

## 2023-05-12 NOTE — Unmapped (Unsigned)
Assessment & Plan        I personally spent *** minutes face-to-face and non-face-to-face in the care of this patient, which includes all pre, intra, and post visit time on the date of service.            Problem List Items Addressed This Visit    None       No follow-ups on file.     Medication adherence and barriers to the treatment plan have been addressed. Opportunities to optimize healthy behaviors have been discussed. Patient / caregiver voiced understanding.      Subjective:     Brandi Flynn is a 22 y.o. female who presents for No chief complaint on file.            History of Present Illness       Last in-clinic visit with Brandi Flynn 03/25/2023 for Kidney stones- Patient was advised to go to ER for IV hydration and pain management. Orders: Urinalysis with Microscopy with Culture Reflex, POCT urinalysis dipstick     Patient presented to [facility] ED 03/25/2023 for Right 6mm Mid Ureteral Stone. Admitted to hospital. OR Procedures: Right - CYSTOURETHROSCOPY,  WITH INSERTION OF INDWELLING URETERAL STENT (EG, GIBBONS OR DOUBLE-J TYPE) Right - CYSTOURETHROSCOPY, W/URETERAL CATHETERIZATION, W/WO IRRIG, INSTILL, OR URETEROPYELOG, EXCLUS OF RADIOLG SVC 03/26/2023. Ancillary Procedures: no procedures. Condition at Discharge: good. Patient is back to their functional baseline and is self-managing all activites of daily living. Discharge Day Services: The patient was seen and examined by the Urology team both in the morning and immediately prior to discharge.  Vital signs and laboratory values were stable and within normal limits.  The physical exam was benign and unchanged and all surgical wounds were examined.  Discharge instructions were explained and all questions answered. Discharged 03/26/2023.     Patient seen multiple times by [facility] ED and urology since 03/27/2023 for renal stones.     Telemedicine appointment with Brandi Flynn 05/10/2023 for Nausea and vomiting, unspecified vomiting type- Please went to ER for additional evaluation and IV hydration with symptoms of nausea and vomiting. You may try Phenergan rectal suppository for the symptoms of nausea. May continue Zofran. Orders: promethazine (PHENERGAN) 25 MG suppository; Insert 1 suppository (25 mg total) into the rectum every six (6) hours as needed for nausea. Acute diarrhea. Upper respiratory tract infection, unspecified type- Please went to ER for additional evaluation of your upper respiratory infection symptoms. Tinea corporis- Take Diflucan as directed. Referral to dermatology. Orders: fluconazole (DIFLUCAN) 150 MG tablet; Take 1 tablet (150 mg total) by mouth once a week. Ambulatory referral to Dermatology; Future. Urge incontinence of urine- Resume oxybutynin. Discussed your symptoms of urge incontinence with your urologist during your next appointment in September. Dysuria- Your  abnormal urinary symptoms such as dysuria,  unpleasant smell with urination and urinary urgency concerning for bacterial cystitis or pyelonephritis. Your recent history of pyelonephritis, fever, and abnormal urinary symptoms . are very concerning   You need to be evaluated in ER as soon as possible. Return in about 1 week (around 05/17/2023) for Recheck.     Patient presented to ED 05/10/2023 for Influenza B. Impression, Differential Diagnosis and Plan of Care: Viral syndrome less likely pneumonia, dehydration, hypoglycemia, MS flare. Patient is a 22 year old female who presents with persistent nausea with vomiting, sore throat and muscle aches.  This is day 3 of her symptoms.  Suspect she has a viral syndrome.  Swab testing positive for influenza B.  She is  provided Zofran to help keep medications and fluids down over the next few days.  She voices understanding of ED return precautions.    MyChart message between patient and Brandi Flynn, 05/10/2023. Discussed recent ER visit and continuation of sxs of diarrhea, vomiting, fever, fatigue, lightheadedness. Advised patient return to ED.     Patient presented to the ED 05/13/2023 for evaluation of nausea and vomiting with persistent cough in the setting of having the flu. We treated you with IV fluids and Zofran, an anti-nausea medication. We are giving you a cough suppressant called Phenergan-DM. This medication should also help nausea and vomiting. Do not use Phenergan suppositories with this medication.  You may take your Zofran for breakthrough nausea. Please continue to drink plenty of fluids to stay well-hydrated. Your symptoms should begin to improve in 1-2 days. You may use tylenol or motrin as directed on the bottle for pain management. Please follow up with your primary doctor in 2-3 days regarding today's ED visit.      MyChart message with Brandi Flynn 05/16/2023. Patient was contacted over the phone. Patient reports that she has frequent urination and urgency, dysuria symptoms became slightly worse in the last 7 days .   Patient reports that she has developed another episode of UTI. Patient had UTI in 03/30/2023. Patient denies nausea, fever, flank pain. Patient was advised to start Bactrim DS and Pyridium. Patient was advised to contact the clinic if her symptoms worsen or she develops any new symptoms.      PHQ-2 Score:      PHQ-9 Score:      Edinburgh Score:      {select_status_or_delete_smartlist:64641}    Hemoglobin A1C (%)   Date Value   12/30/2022 6.0 (H)     No results found for: LDL  BP Readings from Last 3 Encounters:   05/10/23 137/87   05/06/23 139/79   05/01/23 140/79     Wt Readings from Last 3 Encounters:   05/06/23 (!) 140.2 kg (309 lb)   04/22/23 (!) 140.5 kg (309 lb 11.9 oz)   04/11/23 (!) 139.7 kg (308 lb)           Review of Systems     History obtained from {:[id number]} and chart review     Unless otherwise stated in the HPI:  CONSTITUTIONAL: no f/c/s  HEENT: Eyes: No diplopia or blurred vision. ENT: No earache, sore throat or runny nose.   CARDIOVASCULAR: No pressure, squeezing, strangling, tightness, heaviness or aching about the chest, neck, axilla or epigastrium.   RESPIRATORY: No cough, shortness of breath, PND or orthopnea.   GASTROINTESTINAL: No nausea, vomiting or diarrhea.   GENITOURINARY: No dysuria, frequency or urgency.   MUSCULOSKELETAL: As per HPI.   SKIN: No change in skin, hair or nails.   NEUROLOGIC: No paresthesias, fasciculations, seizures or weakness.   PSYCHIATRIC: No disorder of thought or mood.   ENDOCRINE: No heat or cold intolerance, polyuria or polydipsia.   HEMATOLOGICAL: No easy bruising or bleeding.            Review of History     MEDICATIONS:        Current Outpatient Medications on File Prior to Visit   Medication Sig Dispense Refill    [EXPIRED] acetaminophen (TYLENOL EXTRA STRENGTH) 500 MG tablet Take 2 tablets (1,000 mg total) by mouth every eight (8) hours for 7 days. 42 tablet 0    carboxymethylcellulose sodium (REFRESH CELLUVISC) 1 % DpGe Administer 1 drop to both eyes four (  4) times a day. 1 each 0    cholecalciferol, vitamin D3 25 mcg, 1,000 units,, (CHOLECALCIFEROL-25 MCG, 1,000 UNIT,) 1,000 unit (25 mcg) tablet Take 1 tablet (25 mcg total) by mouth daily.      [EXPIRED] ciprofloxacin HCl (CIPRO) 500 MG tablet Take 1 tablet (500 mg total) by mouth once for 1 dose. Take the 1-2 hours prior to ureteral stent removal 1 tablet 0    famotidine (PEPCID) 20 MG tablet Take 1 tablet (20 mg total) by mouth two (2) times a day. 60 tablet 0    fluconazole (DIFLUCAN) 150 MG tablet Take 1 tablet (150 mg total) by mouth once a week. 4 tablet 0    gabapentin (NEURONTIN) 300 MG capsule Take 2 capsules (600 mg total) by mouth Three (3) times a day. 180 capsule 0    [EXPIRED] ketorolac (TORADOL) 10 mg tablet Take 1 tablet (10 mg total) by mouth every six (6) hours as needed for pain for up to 5 days. 20 tablet 0    loperamide (IMODIUM A-D) 2 mg tablet Take 1 tablet (2 mg total) by mouth four (4) times a day as needed for diarrhea. 30 tablet 0    [EXPIRED] metoclopramide (REGLAN) 10 MG tablet Take 1 tablet (10 mg total) by mouth every six (6) hours for 7 days. 28 tablet 0    nystatin (MYCOSTATIN) 100,000 unit/gram powder Apply 1 Application topically two (2) times a day. 60 g 1    ondansetron (ZOFRAN-ODT) 4 MG disintegrating tablet Dissolve 1 tablet (4 mg total) in the mouth every eight (8) hours as needed for nausea for up to 7 days. 15 tablet 0    oseltamivir (TAMIFLU) 75 MG capsule Take 1 capsule (75 mg total) by mouth two (2) times a day for 5 days. 10 capsule 0    oxybutynin (DITROPAN) 5 MG tablet Take 1 tablet (5 mg total) by mouth Three (3) times a day. 90 tablet 11    [EXPIRED] phenazopyridine (PYRIDIUM) 100 MG tablet Take 2 tablets (200 mg total) by mouth Three (3) times a day as needed for pain for up to 7 days. 21 tablet 0    promethazine (PHENERGAN) 25 MG suppository Insert 1 suppository (25 mg total) into the rectum every six (6) hours as needed for nausea. 24 suppository 1     No current facility-administered medications on file prior to visit.        ALLERGIES:      Blueberry, Peanut, and Tamsulosin         PAST MEDICAL HISTORY:     Past Medical History:   Diagnosis Date    Disease of thyroid gland     Multiple sclerosis (CMS-HCC)     Obesity     Vertigo        PAST SURGICAL HISTORY:     Past Surgical History:   Procedure Laterality Date    PR CYSTO/URETERO/PYELOSCOPY W/LITHOTRIPSY Right 04/22/2023    Procedure: CYSTOURETHROSCOPY, WITH URETEROSCOPY &/OR PYELOSCOPY; WITH LITHOTRIPSY (URETERAL CATHETERIZATION INCLUDED);  Surgeon: Turner Daniels, MD;  [facility]: [facility];  Service: Urology    PR CYSTOSCOPY,INSERT URETERAL STENT Right 03/26/2023    Procedure: CYSTOURETHROSCOPY,  WITH INSERTION OF INDWELLING URETERAL STENT (EG, GIBBONS OR DOUBLE-J TYPE);  Surgeon: Annye English, MD;  Location: MAIN OR [facility];  Service: Urology    PR CYSTOURETHROSCOPY,URETER CATHETER Right 03/26/2023    Procedure: CYSTOURETHROSCOPY, W/URETERAL CATHETERIZATION, W/WO IRRIG, INSTILL, OR URETEROPYELOG, EXCLUS OF RADIOLG SVC;  Surgeon: Kevin Fenton  Loraine Leriche, MD;  [facility];  Service: Urology       PROBLEM LIST:     Patient Active Problem List    Diagnosis Date Noted    Acute diarrhea 05/10/2023    Upper respiratory tract infection 05/10/2023    Tinea corporis 05/10/2023    Urge incontinence of urine 05/10/2023    Dysuria 05/10/2023    Nausea & vomiting 04/13/2023    Pyelonephritis 04/11/2023    Kidney stones 03/25/2023    Skin infection 03/15/2023    Obesity 01/31/2023    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 She has never used smokeless tobacco. She reports that she does not currently use alcohol. She reports that she does not use drugs.   FAMILY HISTORY:     family history includes Thyroid disease in her maternal grandfather, maternal grandmother, mother, and paternal grandmother; Vitiligo in her paternal grandmother.       Objective:    There were no vitals taken for this visit. - There is no height or weight on file to calculate BMI.    Physical exam:    ***       No results found for this or any previous visit (from the past 24 hour(s)).     I attest that I, Wandalee Ferdinand, personally documented this note while acting as scribe for Arlyss Gandy, MD.      Wandalee Ferdinand, Scribe.  05/19/2023     The documentation recorded by the scribe accurately reflects the service I personally performed and the decisions made by me.    Arlyss Gandy, MD

## 2023-05-13 ENCOUNTER — Ambulatory Visit
Admit: 2023-05-13 | Discharge: 2023-05-14 | Disposition: A | Discharge status: Home / Self Care | Payer: PRIVATE HEALTH INSURANCE | Attending: Student in an Organized Health Care Education/Training Program

## 2023-05-13 LAB — BASIC METABOLIC PANEL
ANION GAP: 8 mmol/L (ref 5–14)
BLOOD UREA NITROGEN: 9 mg/dL (ref 9–23)
BUN / CREAT RATIO: 13
CALCIUM: 9.4 mg/dL (ref 8.7–10.4)
CHLORIDE: 104 mmol/L (ref 98–107)
CO2: 28.5 mmol/L (ref 20.0–31.0)
CREATININE: 0.7 mg/dL
EGFR CKD-EPI (2021) FEMALE: >90 mL/min/{1.73_m2} (ref >=60)
GLUCOSE RANDOM: 114 mg/dL (ref 70–179)
POTASSIUM: 3.5 mmol/L (ref 3.4–4.8)
SODIUM: 140 mmol/L (ref 135–145)

## 2023-05-13 LAB — CBC W/ AUTO DIFF
BASOPHILS ABSOLUTE COUNT: 0 10*9/L (ref 0.0–0.1)
BASOPHILS RELATIVE PERCENT: 0.6 %
EOSINOPHILS ABSOLUTE COUNT: 0 10*9/L (ref 0.0–0.5)
EOSINOPHILS RELATIVE PERCENT: 1.4 %
HEMATOCRIT: 45.2 % — ABNORMAL HIGH (ref 34.0–44.0)
HEMOGLOBIN: 15.3 g/dL — ABNORMAL HIGH (ref 11.3–14.9)
LYMPHOCYTES ABSOLUTE COUNT: 0.9 10*9/L — ABNORMAL LOW (ref 1.1–3.6)
LYMPHOCYTES RELATIVE PERCENT: 30.9 %
MEAN CORPUSCULAR HEMOGLOBIN CONC: 33.9 g/dL (ref 32.3–35.0)
MEAN CORPUSCULAR HEMOGLOBIN: 29.7 pg (ref 25.9–32.4)
MEAN CORPUSCULAR VOLUME: 87.5 fL (ref 77.6–95.7)
MEAN PLATELET VOLUME: 9.4 fL (ref 7.3–10.7)
MONOCYTES ABSOLUTE COUNT: 0.4 10*9/L (ref 0.3–0.8)
MONOCYTES RELATIVE PERCENT: 14.7 %
NEUTROPHILS ABSOLUTE COUNT: 1.6 10*9/L (ref 1.5–6.4)
NEUTROPHILS RELATIVE PERCENT: 52.4 %
NUCLEATED RED BLOOD CELLS: 0 /100{WBCs} (ref <=4)
PLATELET COUNT: 142 10*9/L — ABNORMAL LOW (ref 170–380)
RED BLOOD CELL COUNT: 5.17 10*12/L — ABNORMAL HIGH (ref 3.95–5.13)
RED CELL DISTRIBUTION WIDTH: 13.3 % (ref 12.2–15.2)
WBC ADJUSTED: 3 10*9/L — ABNORMAL LOW (ref 4.2–10.2)

## 2023-05-13 LAB — URINALYSIS WITH MICROSCOPY WITH CULTURE REFLEX PERFORMABLE
BILIRUBIN UA: NEGATIVE
BLOOD UA: NEGATIVE
GLUCOSE UA: NEGATIVE
NITRITE UA: NEGATIVE
PH UA: 6 (ref 5.0–9.0)
PROTEIN UA: 50 — AB
RBC UA: 5 /HPF — ABNORMAL HIGH (ref <=4)
SPECIFIC GRAVITY UA: 1.035 — ABNORMAL HIGH (ref 1.003–1.030)
SQUAMOUS EPITHELIAL: 10 /HPF — ABNORMAL HIGH (ref 0–5)
UROBILINOGEN UA: <2
WBC UA: 21 /HPF — ABNORMAL HIGH (ref 0–5)

## 2023-05-13 MED ORDER — PROMETHAZINE-DM 6.25 MG-15 MG/5 ML ORAL SYRUP
Freq: Four times a day (QID) | ORAL | 0 refills | 6 days | Status: CP | PRN
Start: 2023-05-13 — End: 2023-05-20

## 2023-05-13 MED ADMIN — ondansetron (ZOFRAN) injection 4 mg: 4 mg | INTRAVENOUS | Stop: 2023-05-13

## 2023-05-13 MED ADMIN — lactated ringers bolus 1,000 mL: 1000 mL | INTRAVENOUS | Stop: 2023-05-13

## 2023-05-13 NOTE — Unmapped (Signed)
Pt is coming in with c./o having the flu, abd pain, back pain, smelly urine and not feeling well. She comes in the ED via EMS 2-3 times a week for the same. Pt with  no distress noted at this time.

## 2023-05-14 NOTE — Unmapped (Shared)
[facility]  Emergency Department Provider Note        ED Clinical Impression     Final diagnoses:   None       ED Assessment/Plan   Brandi Flynn 22 y.o. patient who  has a past medical history of Disease of thyroid gland, Multiple sclerosis (CMS-HCC), Obesity, and Vertigo. she presents with continued NBNB emesis x 1.5 weeks. Physical exam shows generalized abdominal tenderness and CVA tenderness. Recent CT scan non-revealing for acute abdominal pathology. Recently tested positive for influenza B at prior ED visit.  Diagnostic work-up includes CBC, BMP, UA.         Discussion of Management with other Physicians, QHP or Appropriate Source:  N/A  Independent Interpretation of Studies: EKG  N/A; RAD  N/A, POCUS  N/A  External Records Reviewed: n/a  Escalation of Care, Consideration of Admission/Observation/Transfer:  N/A  Social determinants that significantly affected care: None applicable  Prescription drug(s) considered but not prescribed: n/a  Diagnostic tests considered but not performed: CT scan last performed 05/06/23  History obtained from other sources: None    Medical Decision Making    Initial diagnostic work-up shows no electrolyte abnormalities, CBC not indicative of acute infection, UA shows small leukocytes, proteinuria, RBCs, WBCs, few calcium oxalate crystals.         History     Chief Complaint   Patient presents with    Medical Problem     HPI    Brandi Flynn is a 22 y.o. female with past medical history as below who presents with abdominal pain, back pain, dysuria. States she has had weeks of abdominal pain that is generalized in nature, with 1.5 weeks of multiple NBNB emesis episodes. States that she has been taking Aleve for fever symptoms. Denies eating/drinking today. Patient denies chest pain, head pain, shortness of breath. She denies known sick contacts or recent travel. Noted that she was last seen at ED on Tuesday where she tested positive for influenza B.     Past Medical History:   Diagnosis Date    Disease of thyroid gland     Multiple sclerosis (CMS-HCC)     Obesity     Vertigo        Past Surgical History:   Procedure Laterality Date    PR CYSTO/URETERO/PYELOSCOPY W/LITHOTRIPSY Right 04/22/2023    Procedure: CYSTOURETHROSCOPY, WITH URETEROSCOPY &/OR PYELOSCOPY; WITH LITHOTRIPSY (URETERAL CATHETERIZATION INCLUDED);  Surgeon: Turner Daniels, MD;  [facility]: [facility] OR [facility];  Service: Urology    PR CYSTOSCOPY,INSERT URETERAL STENT Right 03/26/2023    Procedure: CYSTOURETHROSCOPY,  WITH INSERTION OF INDWELLING URETERAL STENT (EG, GIBBONS OR DOUBLE-J TYPE);  Surgeon: Annye English, MD;  Location: MAIN OR [facility];  Service: Urology    PR CYSTOURETHROSCOPY,URETER CATHETER Right 03/26/2023    Procedure: CYSTOURETHROSCOPY, W/URETERAL CATHETERIZATION, W/WO IRRIG, INSTILL, OR URETEROPYELOG, EXCLUS OF RADIOLG SVC;  Surgeon: Annye English, MD;  Location: MAIN OR [facility];  Service: Urology       Family History   Problem Relation Age of Onset    Thyroid disease Mother     Thyroid disease Maternal Grandmother     Thyroid disease Maternal Grandfather     Thyroid disease Paternal Grandmother     Vitiligo Paternal Grandmother        Social History     Socioeconomic History    Marital status: Single   Tobacco Use    Smoking status: Every Day     Types:  e-Cigarettes    Smokeless tobacco: Never    Tobacco comments:     Vapes   Vaping Use    Vaping status: Former    Substances: Nicotine   Substance and Sexual Activity    Alcohol use: Not Currently    Drug use: Never    Sexual activity: Yes   Social History Narrative    ** Merged History Encounter **          Social Determinants of Health     Financial Resource Strain: Low Risk  (01/06/2023)    Overall Financial Resource Strain (CARDIA)     Difficulty of Paying Living Expenses: Not hard at all   Food Insecurity: No Food Insecurity (01/06/2023)    Hunger Vital Sign     Worried About Running Out of Food in the Last Year: Never true     Ran Out of Food in the Last Year: Never true   Transportation Needs: No Transportation Needs (01/14/2023)    PRAPARE - Therapist, art (Medical): No     Lack of Transportation (Non-Medical): No       Current Facility-Administered Medications   Medication Dose Route Frequency Provider Last Rate Last Admin    lactated ringers bolus 1,000 mL  1,000 mL Intravenous Once Melchor Amour, PA        ondansetron [facility]) injection 4 mg  4 mg Intravenous Once Melchor Amour, PA         Current Outpatient Medications   Medication Sig Dispense Refill    carboxymethylcellulose sodium (REFRESH CELLUVISC) 1 % DpGe Administer 1 drop to both eyes four (4) times a day. 1 each 0    cholecalciferol, vitamin D3 25 mcg, 1,000 units,, (CHOLECALCIFEROL-25 MCG, 1,000 UNIT,) 1,000 unit (25 mcg) tablet Take 1 tablet (25 mcg total) by mouth daily.      famotidine (PEPCID) 20 MG tablet Take 1 tablet (20 mg total) by mouth two (2) times a day. 60 tablet 0    fluconazole (DIFLUCAN) 150 MG tablet Take 1 tablet (150 mg total) by mouth once a week. 4 tablet 0    gabapentin (NEURONTIN) 300 MG capsule Take 2 capsules (600 mg total) by mouth Three (3) times a day. 180 capsule 0    loperamide (IMODIUM A-D) 2 mg tablet Take 1 tablet (2 mg total) by mouth four (4) times a day as needed for diarrhea. 30 tablet 0    nystatin (MYCOSTATIN) 100,000 unit/gram powder Apply 1 Application topically two (2) times a day. 60 g 1    ondansetron (ZOFRAN-ODT) 4 MG disintegrating tablet Dissolve 1 tablet (4 mg total) in the mouth every eight (8) hours as needed for nausea for up to 7 days. 15 tablet 0    oseltamivir (TAMIFLU) 75 MG capsule Take 1 capsule (75 mg total) by mouth two (2) times a day for 5 days. 10 capsule 0    oxybutynin (DITROPAN) 5 MG tablet Take 1 tablet (5 mg total) by mouth Three (3) times a day. 90 tablet 11    promethazine (PHENERGAN) 25 MG suppository Insert 1 suppository (25 mg total) into the rectum every six (6) hours as needed for nausea. 24 suppository 1           Physical Exam     BP 119/72  - Pulse 98  - Temp 37.3 ??C (99.1 ??F) (Oral)  - Resp 16  - SpO2 100%     Physical Exam  Vitals and nursing  note reviewed.   Constitutional:       Appearance: Normal appearance. She is obese.   HENT:      Head: Normocephalic and atraumatic.      Right Ear: Tympanic membrane, ear canal and external ear normal.      Left Ear: Tympanic membrane, ear canal and external ear normal.      Nose: Nose normal.      Mouth/Throat:      Mouth: Mucous membranes are moist.      Pharynx: Oropharynx is clear.   Cardiovascular:      Rate and Rhythm: Normal rate and regular rhythm.      Pulses: Normal pulses.      Heart sounds: Normal heart sounds.   Pulmonary:      Effort: Pulmonary effort is normal.      Breath sounds: Normal breath sounds.   Abdominal:      General: Bowel sounds are normal. There is distension.      Palpations: Abdomen is soft.      Tenderness: There is abdominal tenderness.   Musculoskeletal:      Cervical back: Normal range of motion and neck supple.   Skin:     General: Skin is warm and dry.      Capillary Refill: Capillary refill takes less than 2 seconds.   Neurological:      General: No focal deficit present.      Mental Status: She is alert and oriented to person, place, and time.

## 2023-05-16 DIAGNOSIS — R300 Dysuria: Principal | ICD-10-CM

## 2023-05-16 MED ORDER — SULFAMETHOXAZOLE 800 MG-TRIMETHOPRIM 160 MG TABLET
ORAL_TABLET | Freq: Two times a day (BID) | ORAL | 0 refills | 7 days | Status: CP
Start: 2023-05-16 — End: 2023-05-23

## 2023-05-16 MED ORDER — PHENAZOPYRIDINE 200 MG TABLET
ORAL_TABLET | Freq: Three times a day (TID) | ORAL | 0 refills | 7 days | Status: CP | PRN
Start: 2023-05-16 — End: 2024-05-15

## 2023-05-17 NOTE — Unmapped (Signed)
Patient was contacted over the phone.  Patient reports that she has frequent urination and urgency, dysuria symptoms became slightly worse in the last 7 days .   Patient reports that she has developed another episode of UTI. Patient had UTI in 03/30/2023.   Patient denies nausea, fever, flank pain.     Patient was advised to start Bactrim DS  And Pyridium.   Patient was advised to contact the clinic if her symptoms worsen or she develops any new symptoms.

## 2023-05-19 ENCOUNTER — Ambulatory Visit: Admit: 2023-05-19 | Payer: PRIVATE HEALTH INSURANCE

## 2023-05-19 MED ORDER — MECLIZINE 25 MG TABLET
ORAL_TABLET | Freq: Three times a day (TID) | ORAL | 0 refills | 30 days | Status: CP | PRN
Start: 2023-05-19 — End: 2023-06-18

## 2023-05-19 NOTE — Unmapped (Signed)
Pt no showed her Nexplanon insertion/removal visit w/me today. She was previously scheduled w/me for this 04/06/23 but canceled this as well 04/01/23. I believe she has been on my schedule for this previously for this but no showed or canceled same day, but I cannot find chart documentation of this. Documenting here as if pt continues to no show or same day cancel I will eventually not allow her to reschedule this w/me as it uses a slot.

## 2023-05-19 NOTE — Unmapped (Signed)
Patient was contacted over the phone.  Patient reports that she can keep liquids and solid food if  she takes Zofran every 6 hours.  Patient reports that phenergan does not work well for nausea.     Patient reports that she had 1 episode of diarrhea today patient noticed blood with wiping.  Patient denies any change in the color of her stool.     Patient has a history of frequent diarrhea. Normal CT scans , C-Diff test was negative.     Patient presents some abdominal discomfort ( that is worse after she takes medications).)    Urine - patient reports improvement in urinary frequency and denies dysuria. Patient is taking Bactrim DS and Pyridium.       Patient was advised to continue current medications.  Eat frequent  small meals, take Zofran around the clock. Avoid taking medications on empty stomach.      Go to Er if unable to keep fluids, or if develops acute abdominal pain, fever or rectal bleeding.

## 2023-05-26 ENCOUNTER — Ambulatory Visit
Admit: 2023-05-26 | Discharge: 2023-05-26 | Disposition: A | Discharge status: Home / Self Care | Payer: PRIVATE HEALTH INSURANCE | Attending: Family

## 2023-05-26 ENCOUNTER — Emergency Department
Admit: 2023-05-26 | Discharge: 2023-05-26 | Disposition: A | Discharge status: Home / Self Care | Payer: PRIVATE HEALTH INSURANCE | Attending: Family

## 2023-05-26 DIAGNOSIS — R109 Unspecified abdominal pain: Principal | ICD-10-CM

## 2023-05-26 DIAGNOSIS — N390 Urinary tract infection, site not specified: Principal | ICD-10-CM

## 2023-05-26 LAB — URINALYSIS WITH MICROSCOPY WITH CULTURE REFLEX PERFORMABLE
BACTERIA: NONE SEEN /HPF
BILIRUBIN UA: NEGATIVE
BLOOD UA: NEGATIVE
GLUCOSE UA: NEGATIVE
KETONES UA: NEGATIVE
NITRITE UA: NEGATIVE
PH UA: 7.5 (ref 5.0–9.0)
RBC UA: 1 /HPF (ref <=4)
SPECIFIC GRAVITY UA: 1.019 (ref 1.003–1.030)
SQUAMOUS EPITHELIAL: 1 /HPF (ref 0–5)
UROBILINOGEN UA: <2
WBC UA: 6 /HPF — ABNORMAL HIGH (ref 0–5)

## 2023-05-26 LAB — COMPREHENSIVE METABOLIC PANEL
ALBUMIN: 4.1 g/dL (ref 3.4–5.0)
ALKALINE PHOSPHATASE: 57 U/L (ref 46–116)
ALT (SGPT): 123 U/L — ABNORMAL HIGH (ref 10–49)
ANION GAP: 7 mmol/L (ref 5–14)
AST (SGOT): 68 U/L — ABNORMAL HIGH (ref <=34)
BILIRUBIN TOTAL: 0.4 mg/dL (ref 0.3–1.2)
BLOOD UREA NITROGEN: 7 mg/dL — ABNORMAL LOW (ref 9–23)
BUN / CREAT RATIO: 11
CALCIUM: 10.1 mg/dL (ref 8.7–10.4)
CHLORIDE: 107 mmol/L (ref 98–107)
CO2: 27.7 mmol/L (ref 20.0–31.0)
CREATININE: 0.62 mg/dL
EGFR CKD-EPI (2021) FEMALE: >90 mL/min/{1.73_m2} (ref >=60)
GLUCOSE RANDOM: 87 mg/dL (ref 70–179)
POTASSIUM: 4 mmol/L (ref 3.4–4.8)
PROTEIN TOTAL: 7.6 g/dL (ref 5.7–8.2)
SODIUM: 142 mmol/L (ref 135–145)

## 2023-05-26 LAB — PREGNANCY, URINE: PREGNANCY TEST URINE: NEGATIVE

## 2023-05-26 LAB — CBC W/ AUTO DIFF
BASOPHILS ABSOLUTE COUNT: 0.1 10*9/L (ref 0.0–0.1)
BASOPHILS RELATIVE PERCENT: 0.8 %
EOSINOPHILS ABSOLUTE COUNT: 0.2 10*9/L (ref 0.0–0.5)
EOSINOPHILS RELATIVE PERCENT: 2.5 %
HEMATOCRIT: 46.1 % — ABNORMAL HIGH (ref 34.0–44.0)
HEMOGLOBIN: 15.5 g/dL — ABNORMAL HIGH (ref 11.3–14.9)
LYMPHOCYTES ABSOLUTE COUNT: 2.5 10*9/L (ref 1.1–3.6)
LYMPHOCYTES RELATIVE PERCENT: 26.6 %
MEAN CORPUSCULAR HEMOGLOBIN CONC: 33.6 g/dL (ref 32.3–35.0)
MEAN CORPUSCULAR HEMOGLOBIN: 29.6 pg (ref 25.9–32.4)
MEAN CORPUSCULAR VOLUME: 88 fL (ref 77.6–95.7)
MEAN PLATELET VOLUME: 8.5 fL (ref 7.3–10.7)
MONOCYTES ABSOLUTE COUNT: 1.1 10*9/L — ABNORMAL HIGH (ref 0.3–0.8)
MONOCYTES RELATIVE PERCENT: 11.7 %
NEUTROPHILS ABSOLUTE COUNT: 5.5 10*9/L (ref 1.5–6.4)
NEUTROPHILS RELATIVE PERCENT: 58.4 %
NUCLEATED RED BLOOD CELLS: 0 /100{WBCs} (ref <=4)
PLATELET COUNT: 266 10*9/L (ref 170–380)
RED BLOOD CELL COUNT: 5.24 10*12/L — ABNORMAL HIGH (ref 3.95–5.13)
RED CELL DISTRIBUTION WIDTH: 13.6 % (ref 12.2–15.2)
WBC ADJUSTED: 9.3 10*9/L (ref 4.2–10.2)

## 2023-05-26 MED ORDER — CIPROFLOXACIN 500 MG TABLET
ORAL_TABLET | Freq: Two times a day (BID) | ORAL | 0 refills | 7 days | Status: CP
Start: 2023-05-26 — End: 2023-06-02

## 2023-05-26 MED ADMIN — ketorolac (TORADOL) injection 30 mg: 30 mg | INTRAMUSCULAR | @ 21:00:00 | Stop: 2023-05-26

## 2023-05-26 NOTE — Unmapped (Signed)
Pt BIBA from home CO R flank pain starting Saturday, associated N/V. HO kidney stones.

## 2023-05-26 NOTE — Unmapped (Signed)
Emergency Department Provider Note        ED Clinical Impression     Final diagnoses:   Right flank pain (Primary)       ED Assessment/Plan     Patient is here with right flank pain over the last 5 days or so.  Does have some generalized myalgias over the last 2 or 3 days as well.  No associated fever.  Patient is concerned about kidney stone.    Benign exam.    Differential includes UTI versus renal colic.  Also consider musculoskeletal pain.    Reviewed studies from triage.  Normal CBC, CMP, lipase.  Perhaps some evidence of infection with leukocyte esterase and small and white blood cells in urine.  Would recommend treatment of this.  However, given history of renal stones that required significant intervention will obtain CT imaging to rule this out prior to discharge.  Provide Toradol for pain relief.    History     Chief Complaint   Patient presents with    Flank Pain     R flank pain     HPI    Patient is here with right flank pain over the last 5 days or so.  Does have some generalized myalgias over the last 2 or 3 days as well.  No associated fever.  Patient is concerned about kidney stone.  Pain is not worse with eating.    Past Medical History:   Diagnosis Date    Disease of thyroid gland     Multiple sclerosis (CMS-HCC)     Obesity     Vertigo        Past Surgical History:   Procedure Laterality Date    PR CYSTO/URETERO/PYELOSCOPY W/LITHOTRIPSY Right 04/22/2023    Procedure: CYSTOURETHROSCOPY, WITH URETEROSCOPY &/OR PYELOSCOPY; WITH LITHOTRIPSY (URETERAL CATHETERIZATION INCLUDED);  Surgeon: Turner Daniels, MD;  Location: [facility];  Service: Urology    PR CYSTOSCOPY,INSERT URETERAL STENT Right 03/26/2023    Procedure: CYSTOURETHROSCOPY,  WITH INSERTION OF INDWELLING URETERAL STENT (EG, GIBBONS OR DOUBLE-J TYPE);  Surgeon: Annye English, MD;  Location: MAIN OR [facility];  Service: Urology    PR CYSTOURETHROSCOPY,URETER CATHETER Right 03/26/2023    Procedure: CYSTOURETHROSCOPY, W/URETERAL CATHETERIZATION, W/WO IRRIG, INSTILL, OR URETEROPYELOG, EXCLUS OF RADIOLG SVC;  Surgeon: Annye English, MD;  Location: MAIN OR [facility];  Service: Urology       Family History   Problem Relation Age of Onset    Thyroid disease Mother     Thyroid disease Maternal Grandmother     Thyroid disease Maternal Grandfather     Thyroid disease Paternal Grandmother     Vitiligo Paternal Grandmother        Social History     Socioeconomic History    Marital status: Single   Tobacco Use    Smoking status: Every Day     Types: e-Cigarettes    Smokeless tobacco: Never    Tobacco comments:     Vapes   Vaping Use    Vaping status: Former    Substances: Nicotine   Substance and Sexual Activity    Alcohol use: Not Currently    Drug use: Never    Sexual activity: Yes   Social History Narrative    ** Merged History Encounter **          Social Determinants of Health     Financial Resource Strain: Low Risk  (01/06/2023)    Overall Financial Resource Strain (CARDIA)  Difficulty of Paying Living Expenses: Not hard at all   Food Insecurity: No Food Insecurity (01/06/2023)    Hunger Vital Sign     Worried About Running Out of Food in the Last Year: Never true     Ran Out of Food in the Last Year: Never true   Transportation Needs: No Transportation Needs (01/14/2023)    PRAPARE - Therapist, art (Medical): No     Lack of Transportation (Non-Medical): No       Review of Systems   Constitutional:  Negative for fatigue and fever.   Gastrointestinal:  Negative for vomiting.   Genitourinary:  Positive for flank pain. Negative for dysuria.   Musculoskeletal:  Positive for myalgias.       Physical Exam     BP 183/99  - Pulse 98  - Resp 16  - Ht 160 cm (5' 3)  - Wt (!) 138.3 kg (305 lb)  - SpO2 99%  - BMI 54.03 kg/m??     Physical Exam  Vitals reviewed.   Constitutional:       General: She is not in acute distress.     Appearance: She is well-developed.   HENT:      Head: Normocephalic.   Eyes:      General: No scleral icterus.  Cardiovascular:      Rate and Rhythm: Normal rate and regular rhythm.      Heart sounds: Normal heart sounds. No murmur heard.     No friction rub. No gallop.   Pulmonary:      Effort: Pulmonary effort is normal. No respiratory distress.      Breath sounds: Normal breath sounds.   Abdominal:      Palpations: Abdomen is soft.      Tenderness: There is no abdominal tenderness. There is no right CVA tenderness.   Musculoskeletal:         General: Normal range of motion.      Cervical back: Normal range of motion and neck supple.   Skin:     General: Skin is warm and dry.   Neurological:      General: No focal deficit present.      Mental Status: She is alert.   Psychiatric:         Behavior: Behavior normal.         ED Course     ED Course as of 05/26/23 1822   Thu May 26, 2023   1647 Pregnancy test is negative.   1740 No evidence of ureteral lithiasis by my read.   1816 Radiology likewise does not see any evidence of stone in the right ureter.  Given this we will discharge patient home.  Will treat for UTI with antibiotics.         Radiology     CT abdomen pelvis without contrast   Final Result   1. Punctate nonobstructive lower left renal calculus. No other renal or ureteral calculi identified. No hydronephrosis.   2. Redemonstrated pronounced hepatic steatosis.            Medical Decision Making          Nicholes Calamity, [place]  05/26/23 Rickey Primus

## 2023-05-27 NOTE — Unmapped (Signed)
Specialty Medication(s): Aubagio    Brandi Flynn has been dis-enrolled from the [facility] specialty pharmacy services due to medication discontinuation resulting from side effect intolerance.    Additional information provided to the patient: n/a    Arnold Long, PharmD  [facility] Specialty Pharmacist

## 2023-06-27 ENCOUNTER — Telehealth
Admit: 2023-06-27 | Discharge: 2023-06-28 | Payer: PRIVATE HEALTH INSURANCE | Attending: Physician Assistant | Primary: Physician Assistant

## 2023-06-27 DIAGNOSIS — G379 Demyelinating disease of central nervous system, unspecified: Principal | ICD-10-CM

## 2023-07-04 ENCOUNTER — Ambulatory Visit
Admit: 2023-07-04 | Discharge: 2023-07-05 | Disposition: A | Discharge status: Home / Self Care | Payer: PRIVATE HEALTH INSURANCE | Attending: Emergency Medicine

## 2023-07-05 MED ORDER — CEPHALEXIN 500 MG CAPSULE
ORAL_CAPSULE | Freq: Three times a day (TID) | ORAL | 0 refills | 10 days | Status: CP
Start: 2023-07-05 — End: 2023-07-15

## 2023-07-07 DIAGNOSIS — G379 Demyelinating disease of central nervous system, unspecified: Principal | ICD-10-CM

## 2023-07-09 ENCOUNTER — Ambulatory Visit
Admit: 2023-07-09 | Discharge: 2023-07-10 | Disposition: A | Discharge status: Home / Self Care | Payer: PRIVATE HEALTH INSURANCE

## 2023-07-09 DIAGNOSIS — R197 Diarrhea, unspecified: Principal | ICD-10-CM

## 2023-07-09 DIAGNOSIS — R112 Nausea with vomiting, unspecified: Principal | ICD-10-CM

## 2023-07-22 ENCOUNTER — Emergency Department
Admit: 2023-07-22 | Discharge: 2023-07-23 | Disposition: A | Discharge status: Home / Self Care | Payer: PRIVATE HEALTH INSURANCE

## 2023-07-22 ENCOUNTER — Ambulatory Visit
Admit: 2023-07-22 | Discharge: 2023-07-23 | Disposition: A | Discharge status: Home / Self Care | Payer: PRIVATE HEALTH INSURANCE

## 2023-07-22 DIAGNOSIS — W19XXXA Unspecified fall, initial encounter: Principal | ICD-10-CM

## 2023-07-22 DIAGNOSIS — M549 Dorsalgia, unspecified: Principal | ICD-10-CM

## 2023-07-22 MED ORDER — KETOROLAC 10 MG TABLET
ORAL_TABLET | Freq: Four times a day (QID) | ORAL | 0 refills | 5 days | Status: CP | PRN
Start: 2023-07-22 — End: 2023-07-27

## 2023-07-22 MED ORDER — METHOCARBAMOL 500 MG TABLET
ORAL_TABLET | Freq: Four times a day (QID) | ORAL | 0 refills | 5 days | Status: CP
Start: 2023-07-22 — End: 2023-07-27

## 2023-07-30 ENCOUNTER — Ambulatory Visit
Admit: 2023-07-30 | Discharge: 2023-07-31 | Disposition: A | Discharge status: Home / Self Care | Payer: PRIVATE HEALTH INSURANCE | Attending: Emergency Medicine

## 2023-07-30 ENCOUNTER — Emergency Department
Admit: 2023-07-30 | Discharge: 2023-07-31 | Disposition: A | Discharge status: Home / Self Care | Payer: PRIVATE HEALTH INSURANCE | Attending: Emergency Medicine

## 2023-07-30 DIAGNOSIS — R109 Unspecified abdominal pain: Principal | ICD-10-CM

## 2023-07-30 DIAGNOSIS — R112 Nausea with vomiting, unspecified: Principal | ICD-10-CM

## 2023-07-30 MED ORDER — ONDANSETRON 4 MG DISINTEGRATING TABLET
ORAL_TABLET | Freq: Three times a day (TID) | ORAL | 0 refills | 4 days | Status: CP | PRN
Start: 2023-07-30 — End: 2023-08-06

## 2023-08-06 ENCOUNTER — Ambulatory Visit
Admit: 2023-08-06 | Discharge: 2023-08-06 | Disposition: A | Discharge status: Home / Self Care | Payer: PRIVATE HEALTH INSURANCE | Attending: Student in an Organized Health Care Education/Training Program

## 2023-08-06 DIAGNOSIS — N760 Acute vaginitis: Principal | ICD-10-CM

## 2023-08-06 DIAGNOSIS — B9689 Other specified bacterial agents as the cause of diseases classified elsewhere: Principal | ICD-10-CM

## 2023-08-06 MED ORDER — METOCLOPRAMIDE 10 MG TABLET
ORAL_TABLET | Freq: Four times a day (QID) | ORAL | 0 refills | 5 days | Status: CP
Start: 2023-08-06 — End: 2023-08-11

## 2023-08-10 ENCOUNTER — Ambulatory Visit
Admit: 2023-08-10 | Discharge: 2023-08-11 | Disposition: A | Discharge status: Home / Self Care | Payer: PRIVATE HEALTH INSURANCE | Attending: Emergency Medicine

## 2023-08-10 DIAGNOSIS — R112 Nausea with vomiting, unspecified: Principal | ICD-10-CM

## 2023-08-10 DIAGNOSIS — B372 Candidiasis of skin and nail: Principal | ICD-10-CM

## 2023-08-10 MED ORDER — NYSTATIN 100,000 UNIT/GRAM TOPICAL POWDER
Freq: Four times a day (QID) | TOPICAL | 0 refills | 30 days | Status: CP
Start: 2023-08-10 — End: 2024-08-09

## 2023-08-12 DIAGNOSIS — R1110 Vomiting, unspecified: Principal | ICD-10-CM

## 2023-08-12 DIAGNOSIS — R197 Diarrhea, unspecified: Principal | ICD-10-CM

## 2023-08-12 MED ORDER — PROMETHAZINE 25 MG TABLET
ORAL_TABLET | Freq: Four times a day (QID) | ORAL | 0 refills | 4 days | Status: CP | PRN
Start: 2023-08-12 — End: 2023-08-12

## 2023-08-12 MED ORDER — METRONIDAZOLE 0.75 % (37.5 MG/5 GRAM) VAGINAL GEL
Freq: Two times a day (BID) | VAGINAL | 0 refills | 7 days | Status: CP
Start: 2023-08-12 — End: 2023-08-19

## 2023-08-13 ENCOUNTER — Ambulatory Visit
Admit: 2023-08-13 | Discharge: 2023-08-13 | Disposition: A | Discharge status: Home / Self Care | Payer: PRIVATE HEALTH INSURANCE

## 2023-08-14 ENCOUNTER — Ambulatory Visit
Admit: 2023-08-14 | Discharge: 2023-08-15 | Disposition: A | Discharge status: Home / Self Care | Payer: PRIVATE HEALTH INSURANCE | Attending: Emergency Medicine

## 2023-08-14 DIAGNOSIS — R112 Nausea with vomiting, unspecified: Principal | ICD-10-CM

## 2023-08-14 DIAGNOSIS — R109 Unspecified abdominal pain: Principal | ICD-10-CM

## 2023-08-14 DIAGNOSIS — R197 Diarrhea, unspecified: Principal | ICD-10-CM

## 2023-08-14 MED ORDER — PANTOPRAZOLE 20 MG TABLET,DELAYED RELEASE
ORAL_TABLET | Freq: Every day | ORAL | 1 refills | 30 days | Status: CP
Start: 2023-08-14 — End: 2023-10-13

## 2023-08-21 ENCOUNTER — Ambulatory Visit
Admit: 2023-08-21 | Discharge: 2023-08-22 | Disposition: A | Discharge status: Home / Self Care | Payer: PRIVATE HEALTH INSURANCE

## 2023-08-22 MED ORDER — ONDANSETRON 8 MG DISINTEGRATING TABLET
ORAL_TABLET | Freq: Three times a day (TID) | ORAL | 0 refills | 7 days | Status: CP | PRN
Start: 2023-08-22 — End: 2023-08-29

## 2023-08-23 ENCOUNTER — Ambulatory Visit
Admit: 2023-08-23 | Discharge: 2023-08-24 | Disposition: A | Discharge status: Home / Self Care | Payer: PRIVATE HEALTH INSURANCE | Attending: Student in an Organized Health Care Education/Training Program

## 2023-08-23 DIAGNOSIS — E860 Dehydration: Principal | ICD-10-CM

## 2023-08-23 DIAGNOSIS — R110 Nausea: Principal | ICD-10-CM

## 2023-08-23 MED ORDER — METOCLOPRAMIDE 10 MG TABLET
ORAL_TABLET | Freq: Four times a day (QID) | ORAL | 0 refills | 7 days | Status: CP
Start: 2023-08-23 — End: 2023-08-23

## 2023-08-23 MED ORDER — PROCHLORPERAZINE MALEATE 10 MG TABLET
ORAL_TABLET | Freq: Two times a day (BID) | ORAL | 0 refills | 5 days | Status: CP | PRN
Start: 2023-08-23 — End: 2023-08-28

## 2023-08-25 ENCOUNTER — Ambulatory Visit
Admit: 2023-08-25 | Discharge: 2023-08-26 | Disposition: A | Discharge status: Home / Self Care | Payer: PRIVATE HEALTH INSURANCE | Attending: Student in an Organized Health Care Education/Training Program

## 2023-08-25 DIAGNOSIS — Z5320 Procedure and treatment not carried out because of patient's decision for unspecified reasons: Principal | ICD-10-CM

## 2023-08-25 DIAGNOSIS — R1084 Generalized abdominal pain: Principal | ICD-10-CM

## 2023-08-25 DIAGNOSIS — R1115 Cyclical vomiting syndrome unrelated to migraine: Principal | ICD-10-CM

## 2023-08-30 ENCOUNTER — Telehealth: Admit: 2023-08-30 | Discharge: 2023-08-31 | Payer: PRIVATE HEALTH INSURANCE

## 2023-08-30 DIAGNOSIS — M5441 Lumbago with sciatica, right side: Principal | ICD-10-CM

## 2023-08-30 DIAGNOSIS — R112 Nausea with vomiting, unspecified: Principal | ICD-10-CM

## 2023-08-30 DIAGNOSIS — G8929 Other chronic pain: Principal | ICD-10-CM

## 2023-08-30 DIAGNOSIS — F419 Anxiety disorder, unspecified: Principal | ICD-10-CM

## 2023-08-30 DIAGNOSIS — R197 Diarrhea, unspecified: Principal | ICD-10-CM

## 2023-08-30 DIAGNOSIS — M5442 Lumbago with sciatica, left side: Principal | ICD-10-CM

## 2023-08-30 DIAGNOSIS — G35 Multiple sclerosis: Principal | ICD-10-CM

## 2023-08-30 DIAGNOSIS — K589 Irritable bowel syndrome without diarrhea: Principal | ICD-10-CM

## 2023-08-30 DIAGNOSIS — K760 Fatty (change of) liver, not elsewhere classified: Principal | ICD-10-CM

## 2023-08-30 MED ORDER — POLYETHYLENE GLYCOL 3350 17 GRAM ORAL POWDER PACKET
PACK | Freq: Every day | ORAL | 11 refills | 30 days | Status: CP
Start: 2023-08-30 — End: 2024-08-29

## 2023-08-30 MED ORDER — SERTRALINE 25 MG TABLET
ORAL_TABLET | Freq: Every day | ORAL | 0 refills | 30 days | Status: CP
Start: 2023-08-30 — End: 2023-09-29

## 2023-08-30 MED ORDER — ONDANSETRON 4 MG DISINTEGRATING TABLET
ORAL_TABLET | Freq: Three times a day (TID) | 11 refills | 30 days | Status: CP | PRN
Start: 2023-08-30 — End: 2024-08-29

## 2023-08-30 MED ORDER — LOPERAMIDE 2 MG CAPSULE
ORAL_CAPSULE | Freq: Four times a day (QID) | ORAL | 11 refills | 30 days | Status: CP | PRN
Start: 2023-08-30 — End: 2024-08-29

## 2023-08-30 MED ORDER — GABAPENTIN 100 MG CAPSULE
ORAL_CAPSULE | Freq: Three times a day (TID) | ORAL | 3 refills | 90 days | Status: CP
Start: 2023-08-30 — End: 2024-08-29

## 2023-08-30 MED ORDER — PANTOPRAZOLE 20 MG TABLET,DELAYED RELEASE
ORAL_TABLET | Freq: Every day | ORAL | 0 refills | 90 days | Status: CP
Start: 2023-08-30 — End: 2023-11-28

## 2023-09-02 ENCOUNTER — Ambulatory Visit
Admit: 2023-09-02 | Discharge: 2023-09-03 | Disposition: A | Discharge status: Home / Self Care | Payer: PRIVATE HEALTH INSURANCE

## 2023-09-05 DIAGNOSIS — R9082 White matter disease, unspecified: Principal | ICD-10-CM

## 2023-09-09 MED ORDER — OMEPRAZOLE 20 MG CAPSULE,DELAYED RELEASE
ORAL_CAPSULE | Freq: Every day | ORAL | 3 refills | 90.00 days | Status: CP
Start: 2023-09-09 — End: 2024-09-08

## 2023-09-11 ENCOUNTER — Ambulatory Visit
Admit: 2023-09-11 | Discharge: 2023-09-11 | Disposition: A | Discharge status: Home / Self Care | Payer: PRIVATE HEALTH INSURANCE

## 2023-09-11 ENCOUNTER — Emergency Department
Admit: 2023-09-11 | Discharge: 2023-09-11 | Disposition: A | Discharge status: Home / Self Care | Payer: PRIVATE HEALTH INSURANCE

## 2023-09-11 DIAGNOSIS — R109 Unspecified abdominal pain: Principal | ICD-10-CM

## 2023-09-11 DIAGNOSIS — G8929 Other chronic pain: Principal | ICD-10-CM

## 2023-09-11 DIAGNOSIS — N760 Acute vaginitis: Principal | ICD-10-CM

## 2023-09-11 DIAGNOSIS — B9689 Other specified bacterial agents as the cause of diseases classified elsewhere: Principal | ICD-10-CM

## 2023-09-11 MED ORDER — METRONIDAZOLE 0.75 % (37.5 MG/5 GRAM) VAGINAL GEL
Freq: Every evening | VAGINAL | 0 refills | 5.00 days | Status: CP
Start: 2023-09-11 — End: 2023-09-16

## 2023-09-13 DIAGNOSIS — Z7189 Other specified counseling: Principal | ICD-10-CM

## 2023-09-20 ENCOUNTER — Ambulatory Visit: Admit: 2023-09-20 | Discharge: 2023-09-21

## 2023-09-22 ENCOUNTER — Ambulatory Visit
Admit: 2023-09-22 | Discharge: 2023-09-22 | Disposition: A | Discharge status: Home / Self Care | Payer: PRIVATE HEALTH INSURANCE

## 2023-09-22 DIAGNOSIS — R112 Nausea with vomiting, unspecified: Principal | ICD-10-CM

## 2023-09-23 ENCOUNTER — Telehealth
Admit: 2023-09-23 | Discharge: 2023-09-24 | Payer: PRIVATE HEALTH INSURANCE | Attending: Addiction (Substance Use Disorder) | Primary: Addiction (Substance Use Disorder)

## 2023-09-23 DIAGNOSIS — F419 Anxiety disorder, unspecified: Principal | ICD-10-CM

## 2023-09-25 ENCOUNTER — Ambulatory Visit
Admit: 2023-09-25 | Discharge: 2023-09-25 | Disposition: A | Discharge status: Home / Self Care | Payer: PRIVATE HEALTH INSURANCE | Attending: Emergency Medicine

## 2023-09-25 DIAGNOSIS — R112 Nausea with vomiting, unspecified: Principal | ICD-10-CM

## 2023-09-25 DIAGNOSIS — R197 Diarrhea, unspecified: Principal | ICD-10-CM

## 2023-09-25 MED ORDER — ONDANSETRON 4 MG DISINTEGRATING TABLET
ORAL_TABLET | Freq: Three times a day (TID) | ORAL | 0 refills | 5.00 days | Status: CP | PRN
Start: 2023-09-25 — End: 2023-10-02

## 2023-10-02 DIAGNOSIS — F419 Anxiety disorder, unspecified: Principal | ICD-10-CM

## 2023-10-02 MED ORDER — SERTRALINE 25 MG TABLET
ORAL_TABLET | Freq: Every day | ORAL | 0 refills | 0.00 days
Start: 2023-10-02 — End: ?

## 2023-10-03 ENCOUNTER — Encounter: Admit: 2023-10-03 | Discharge: 2023-10-04 | Payer: PRIVATE HEALTH INSURANCE

## 2023-10-03 DIAGNOSIS — E119 Type 2 diabetes mellitus without complications: Principal | ICD-10-CM

## 2023-10-03 DIAGNOSIS — K589 Irritable bowel syndrome without diarrhea: Principal | ICD-10-CM

## 2023-10-03 DIAGNOSIS — F419 Anxiety disorder, unspecified: Principal | ICD-10-CM

## 2023-10-03 MED ORDER — BLOOD-GLUCOSE METER KIT WRAPPER
Freq: Once | 0 refills | 0.00 days | Status: CP
Start: 2023-10-03 — End: 2023-10-03

## 2023-10-03 MED ORDER — SERTRALINE 25 MG TABLET
ORAL_TABLET | Freq: Every day | ORAL | 3 refills | 90.00 days | Status: CP
Start: 2023-10-03 — End: 2024-10-02

## 2023-10-03 MED ORDER — METFORMIN ER 500 MG TABLET,EXTENDED RELEASE 24 HR
ORAL_TABLET | Freq: Two times a day (BID) | ORAL | 11 refills | 30.00 days | Status: CP
Start: 2023-10-03 — End: 2024-10-02

## 2023-10-03 MED ORDER — LANCETS
3 refills | 0.00 days | Status: CP
Start: 2023-10-03 — End: ?

## 2023-10-03 MED ORDER — BLOOD GLUCOSE TEST STRIPS
ORAL_STRIP | Freq: Every day | 3 refills | 0.00 days | Status: CP
Start: 2023-10-03 — End: 2024-10-02

## 2023-10-04 ENCOUNTER — Ambulatory Visit: Admit: 2023-10-04 | Discharge: 2023-10-05 | Payer: PRIVATE HEALTH INSURANCE

## 2023-10-05 ENCOUNTER — Emergency Department: Admit: 2023-10-05 | Discharge: 2023-10-06 | Discharge status: Against Medical Advice | Payer: PRIVATE HEALTH INSURANCE

## 2023-10-07 ENCOUNTER — Encounter
Admit: 2023-10-07 | Discharge: 2023-10-08 | Payer: PRIVATE HEALTH INSURANCE | Attending: Addiction (Substance Use Disorder) | Primary: Addiction (Substance Use Disorder)

## 2023-10-11 DIAGNOSIS — E119 Type 2 diabetes mellitus without complications: Principal | ICD-10-CM

## 2023-10-11 MED ORDER — FREESTYLE LIBRE 2 SENSOR KIT
PACK | 11 refills | 0.00 days | Status: CP
Start: 2023-10-11 — End: 2024-10-10

## 2023-10-13 IMAGING — MR MR BRAIN/TEMPORAL BONE/IAC
13 of 17 series · 28 of 48 positions shown · IV contrast (gadavist)
Comparison: None Available.

CLINICAL DATA: Vestibular neuronitis, extreme vertigo

EXAM:
MRI HEAD WITHOUT AND WITH CONTRAST
TECHNIQUE: Multiplanar, multiecho pulse sequences of the brain and surrounding
structures were obtained without and with intravenous contrast.
CONTRAST:  10mL GADAVIST GADOBUTROL 1 MMOL/ML IV SOLN

[Series 5: T1 · sagittal · 5.0mm · 0.62mm/px · 1 of 25 slices shown (1 of 3)]
[im 1/25]
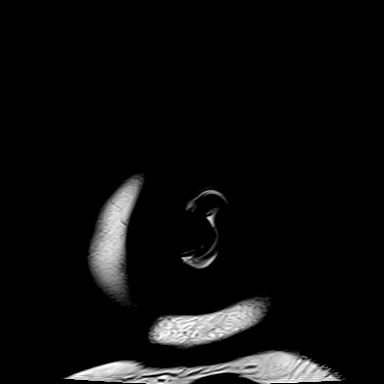

[Series 6: ax dwi_tracew · axial · 3.0mm · 0.65mm/px · z∈[-98,+57]mm · 3 of 48 slices shown]
[im 1/48]
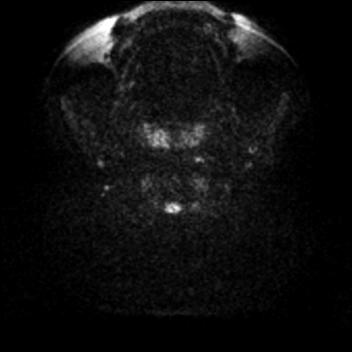
[im 24/48]
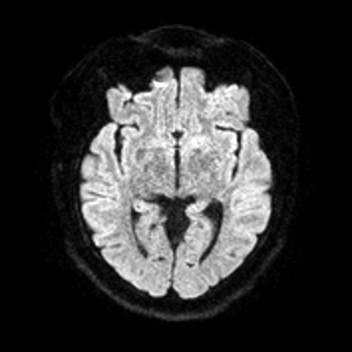
[im 48/48]
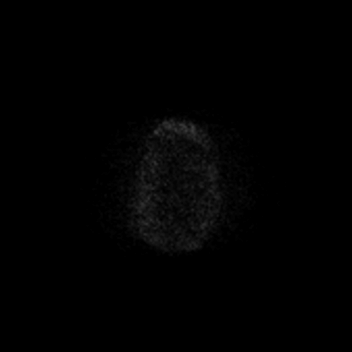

[Series 7: ax dwi_adc · axial · 3.0mm · 0.65mm/px · z∈[-98,+57]mm · 3 of 48 slices shown]
[im 1/48]
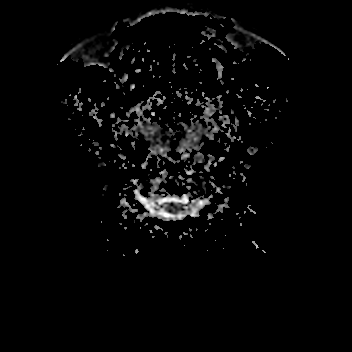
[im 24/48]
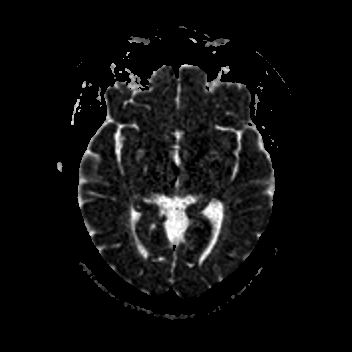
[im 48/48]
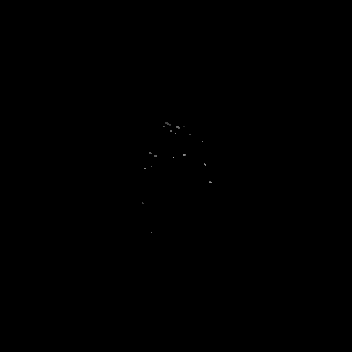

[Series 8: T2 · axial · 5.0mm · 0.53mm/px · 1 of 26 slices shown]
[im 1/26]
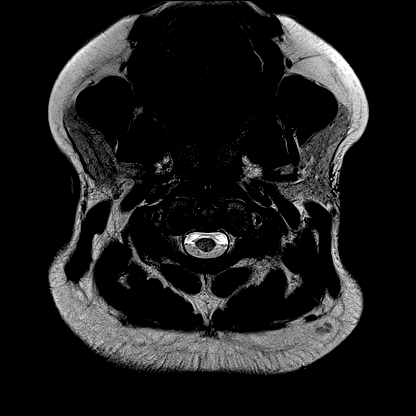

[Series 9: cor dwi_tracew · coronal · 5.0mm · 0.68mm/px · 2 of 40 slices shown]
[im 1/40]
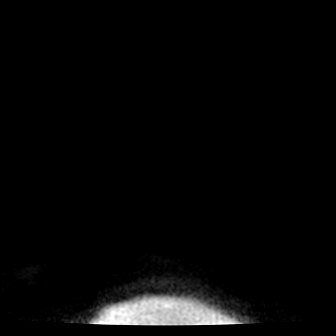
[im 40/40]
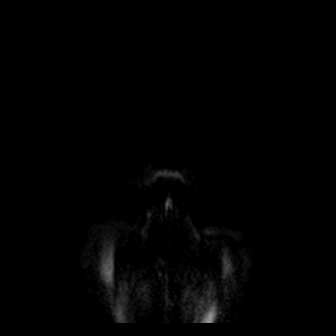

[Series 10: cor dwi_adc · coronal · 5.0mm · 0.68mm/px · 2 of 40 slices shown]
[im 1/40]
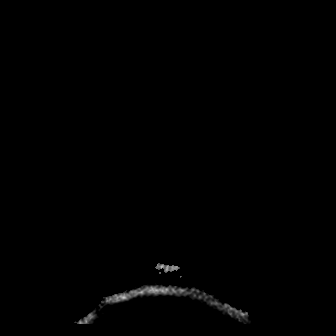
[im 40/40]
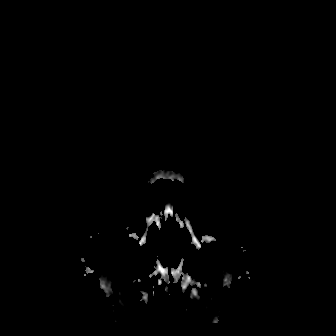

[Series 15: FLAIR · axial · 3.0mm · 0.53mm/px · z∈[-102,+60]mm · 3 of 55 slices shown (1 of 2)]
[im 1/55]
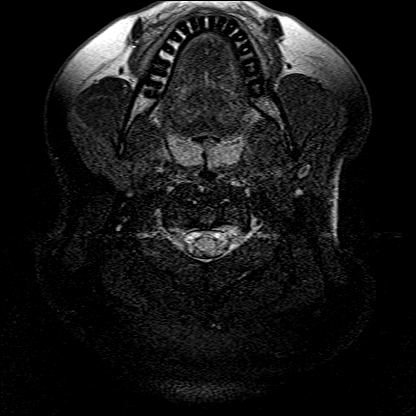
[im 28/55]
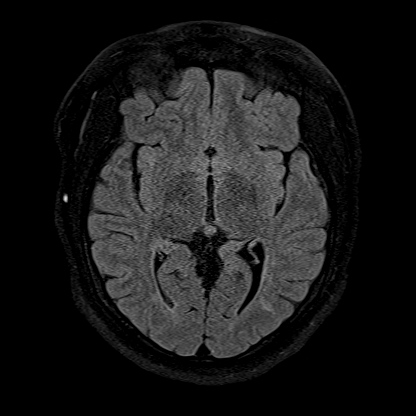
[im 55/55]
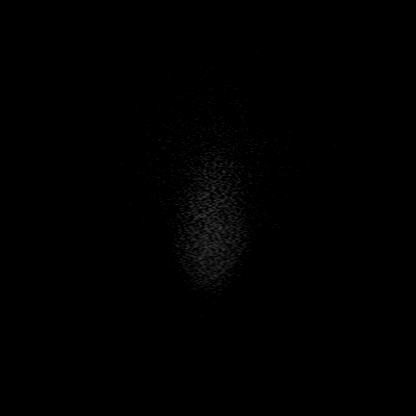

[Series 16: FLAIR · sagittal · 5.0mm · 0.94mm/px · 1 of 25 slices shown (2 of 2)]
[im 1/25]
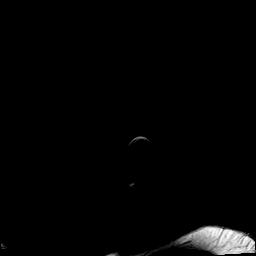

[Series 17: T1 · coronal · non-contrast · 3.0mm · 0.21mm/px · 1 of 13 slices shown (2 of 3)]
[im 1/13]
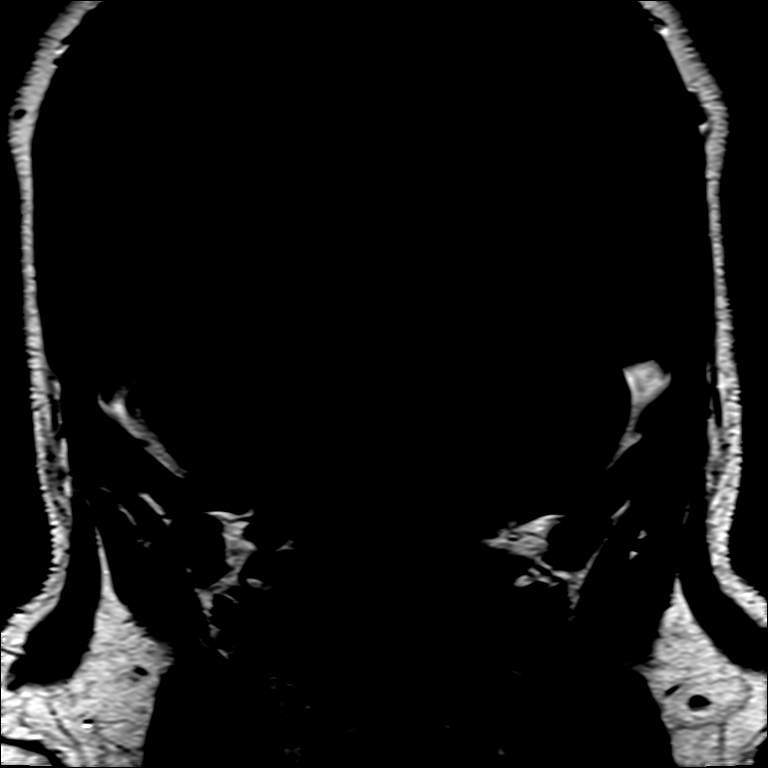

[Series 19: T1 · axial · non-contrast · 3.0mm · 0.21mm/px · 1 of 15 slices shown (3 of 3)]
[im 1/15]
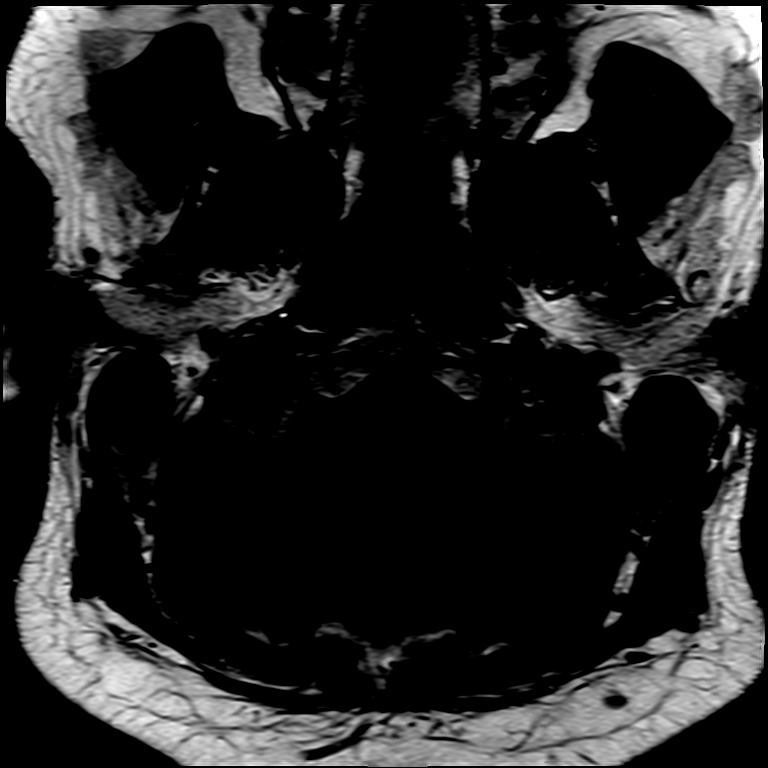

[Series 24: T1 post-contrast · axial · 1.0mm · 0.98mm/px · z∈[-133,+42]mm · 8 of 175 slices shown (1 of 3)]
[im 1/175]
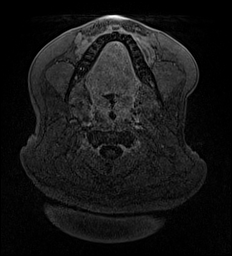
[im 20/175]
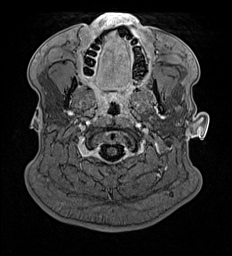
[im 59/175]
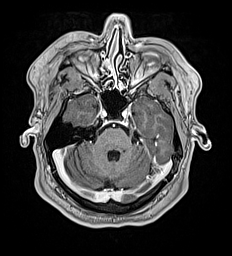
[im 78/175]
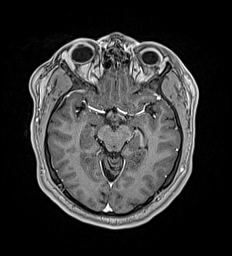
[im 97/175]
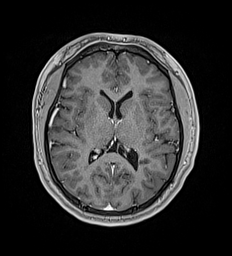
[im 117/175]
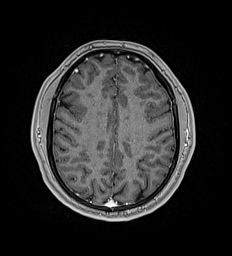
[im 155/175]
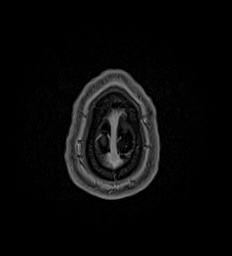
[im 175/175]
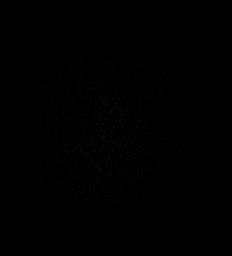

[Series 25: T1 post-contrast · coronal · 3.0mm · 0.21mm/px · 1 of 13 slices shown (2 of 3)]
[im 1/13]
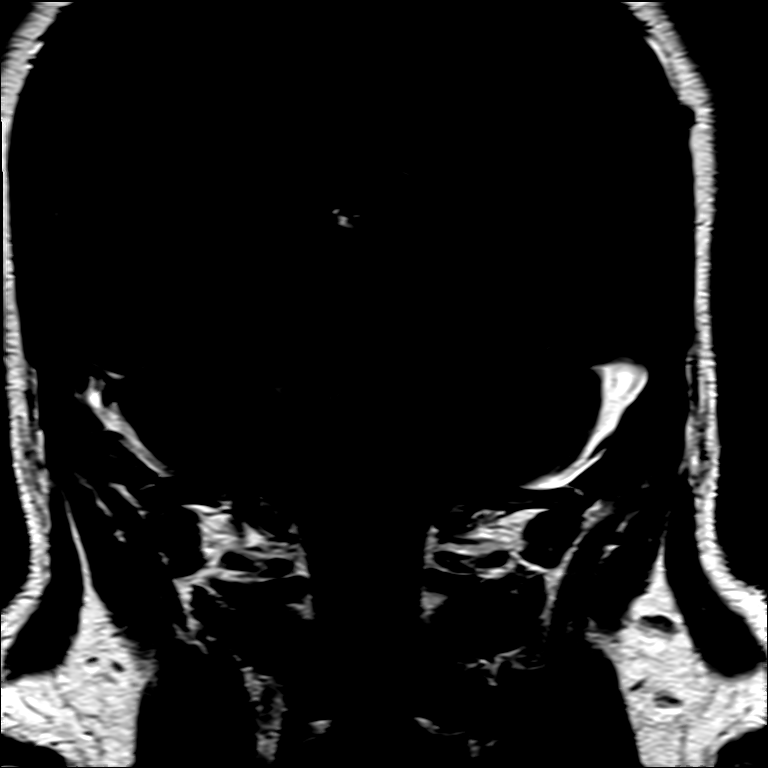

[Series 26: T1 post-contrast · axial · 3.0mm · 0.21mm/px · 1 of 15 slices shown (3 of 3)]
[im 1/15]
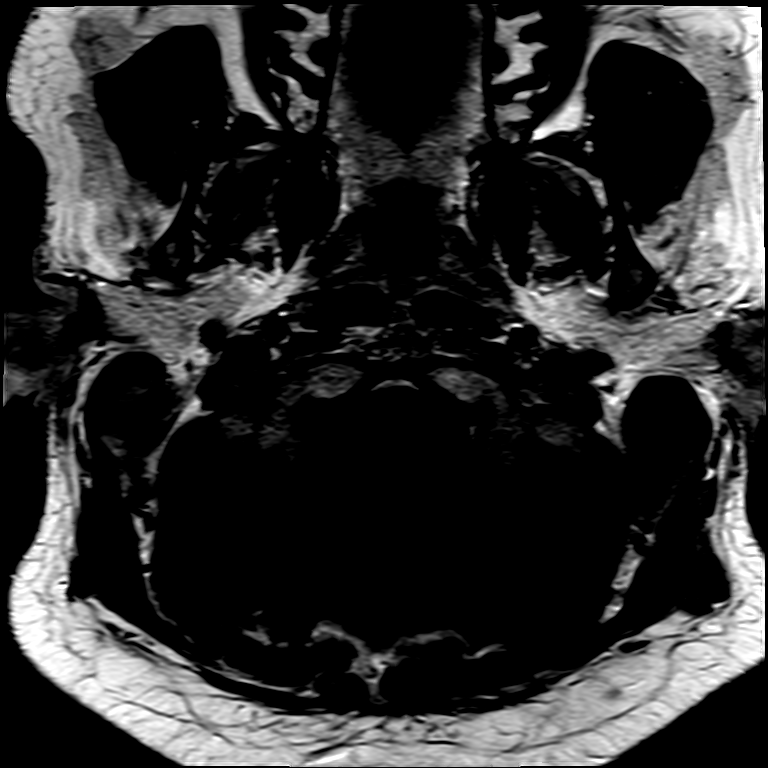

[28 of 48 positions shown; findings below may reference images not displayed]

FINDINGS: Brain: No restricted diffusion to suggest acute or subacute infarct.
No acute hemorrhage, mass, mass effect, or midline shift. No
hydrocephalus or extra-axial collection. No hemosiderin deposition
to suggest remote hemorrhage. The pituitary is normal in size.
Normal craniocervical junction.

Several T2 hyperintense foci in the periventricular and
juxtacortical white matter, several of which appear to be
pericallosal and radially oriented (series 15, image 36, and series
16, images 11 and 16). An additional T2 hyperintense focus is noted
in the left midbrain (series 15, image 23). No enhancing lesions.

Cranial Nerves VII and VIII: Normal bilaterally, without evidence of
mass or abnormal enhancement.

Cochleae: Normal bilaterally.

Semicircular Canals: Normal bilaterally.

Porus Acusticus: Normal bilaterally.

Cerebellopontine Angle: Normal bilaterally, without evidence of
mass.

Vestibular Aqueducts: Normal bilaterally.

Vascular: Normal arterial flow voids. Venous sinuses are patent on
postcontrast imaging.

Skull and upper cervical spine: Normal marrow signal.

Sinuses/Orbits: Mild mucosal thickening in the maxillary sinuses.
The orbits are unremarkable.

Other: The mastoids are well aerated.
IMPRESSION: 1. T2 hyperintense foci in the periventricular white matter,
somewhat radially oriented, with additional juxtacortical T2
hyperintense foci and a focus in the left midbrain, overall
concerning for demyelinating disease. No contrast enhancing lesions
to suggest active demyelination.
2. Normal appearance of the bilateral internal auditory canals.

## 2023-10-17 ENCOUNTER — Emergency Department
Admit: 2023-10-17 | Discharge: 2023-10-17 | Disposition: A | Discharge status: Home / Self Care | Payer: PRIVATE HEALTH INSURANCE

## 2023-10-18 ENCOUNTER — Ambulatory Visit: Admit: 2023-10-18 | Discharge: 2023-10-19 | Payer: PRIVATE HEALTH INSURANCE

## 2023-10-22 ENCOUNTER — Emergency Department: Admit: 2023-10-22 | Discharge: 2023-10-23 | Discharge status: Against Medical Advice | Payer: PRIVATE HEALTH INSURANCE

## 2023-10-28 ENCOUNTER — Encounter
Admit: 2023-10-28 | Discharge: 2023-10-29 | Payer: PRIVATE HEALTH INSURANCE | Attending: Addiction (Substance Use Disorder) | Primary: Addiction (Substance Use Disorder)

## 2023-10-28 DIAGNOSIS — E119 Type 2 diabetes mellitus without complications: Principal | ICD-10-CM

## 2023-11-01 ENCOUNTER — Ambulatory Visit: Admit: 2023-11-01 | Payer: PRIVATE HEALTH INSURANCE

## 2023-11-02 ENCOUNTER — Emergency Department: Admit: 2023-11-02 | Discharge: 2023-11-02 | Discharge status: Against Medical Advice | Payer: PRIVATE HEALTH INSURANCE

## 2023-11-10 ENCOUNTER — Encounter: Admit: 2023-11-10 | Discharge: 2023-11-11 | Payer: PRIVATE HEALTH INSURANCE

## 2023-11-10 DIAGNOSIS — F419 Anxiety disorder, unspecified: Principal | ICD-10-CM

## 2023-11-10 MED ORDER — SERTRALINE 50 MG TABLET
ORAL_TABLET | Freq: Every day | ORAL | 3 refills | 90.00 days | Status: CP
Start: 2023-11-10 — End: 2024-11-09

## 2023-11-11 ENCOUNTER — Encounter
Admit: 2023-11-11 | Discharge: 2023-11-12 | Payer: PRIVATE HEALTH INSURANCE | Attending: Addiction (Substance Use Disorder) | Primary: Addiction (Substance Use Disorder)

## 2023-11-14 ENCOUNTER — Emergency Department: Admit: 2023-11-14 | Discharge: 2023-11-14 | Discharge status: Against Medical Advice | Payer: PRIVATE HEALTH INSURANCE

## 2023-11-25 ENCOUNTER — Encounter
Admit: 2023-11-25 | Discharge: 2023-11-26 | Payer: PRIVATE HEALTH INSURANCE | Attending: Addiction (Substance Use Disorder) | Primary: Addiction (Substance Use Disorder)

## 2023-11-26 ENCOUNTER — Emergency Department
Admit: 2023-11-26 | Discharge: 2023-11-26 | Disposition: A | Discharge status: Home / Self Care | Payer: PRIVATE HEALTH INSURANCE | Attending: Family

## 2023-11-26 DIAGNOSIS — H81391 Other peripheral vertigo, right ear: Principal | ICD-10-CM

## 2023-11-26 MED ORDER — MECLIZINE 25 MG TABLET
ORAL_TABLET | Freq: Three times a day (TID) | ORAL | 0 refills | 10.00 days | Status: CP | PRN
Start: 2023-11-26 — End: 2023-12-16

## 2023-11-26 MED ORDER — SCOPOLAMINE 1 MG OVER 3 DAYS TRANSDERMAL PATCH
MEDICATED_PATCH | TRANSDERMAL | 0 refills | 12.00 days | Status: CP
Start: 2023-11-26 — End: 2023-12-26

## 2023-12-07 ENCOUNTER — Emergency Department: Admit: 2023-12-07 | Discharge: 2023-12-08 | Discharge status: Against Medical Advice | Payer: PRIVATE HEALTH INSURANCE

## 2023-12-12 ENCOUNTER — Emergency Department: Admit: 2023-12-12 | Discharge: 2023-12-13 | Discharge status: Against Medical Advice | Payer: PRIVATE HEALTH INSURANCE

## 2024-01-04 ENCOUNTER — Ambulatory Visit: Admit: 2024-01-04

## 2024-01-09 ENCOUNTER — Emergency Department: Admit: 2024-01-09 | Discharge: 2024-01-10 | Discharge status: Against Medical Advice | Payer: Medicaid (Managed Care)

## 2024-01-24 DIAGNOSIS — E119 Type 2 diabetes mellitus without complications: Principal | ICD-10-CM

## 2024-01-24 MED ORDER — LANCETS
3 refills | 0.00000 days | Status: CP
Start: 2024-01-24 — End: ?

## 2024-01-24 MED ORDER — BLOOD GLUCOSE TEST STRIPS
ORAL_STRIP | Freq: Every day | 3 refills | 0.00000 days | Status: CP
Start: 2024-01-24 — End: 2025-01-23

## 2024-01-27 ENCOUNTER — Encounter
Admit: 2024-01-27 | Discharge: 2024-01-28 | Payer: Medicaid (Managed Care) | Attending: Addiction (Substance Use Disorder) | Primary: Addiction (Substance Use Disorder)

## 2024-01-28 ENCOUNTER — Emergency Department: Admit: 2024-01-28 | Discharge: 2024-01-28 | Discharge status: Against Medical Advice | Payer: Medicaid (Managed Care)

## 2024-06-06 DIAGNOSIS — E119 Type 2 diabetes mellitus without complications: Principal | ICD-10-CM

## 2024-06-06 DIAGNOSIS — B354 Tinea corporis: Principal | ICD-10-CM

## 2024-06-06 DIAGNOSIS — R42 Dizziness and giddiness: Principal | ICD-10-CM

## 2024-06-06 MED ORDER — SCOPOLAMINE 1 MG OVER 3 DAYS TRANSDERMAL PATCH
MEDICATED_PATCH | TRANSDERMAL | 0 refills | 60.00000 days | Status: CP
Start: 2024-06-06 — End: 2024-08-05

## 2024-06-06 MED ORDER — FLUCONAZOLE 150 MG TABLET
ORAL_TABLET | ORAL | 0 refills | 28.00000 days | Status: CP
Start: 2024-06-06 — End: 2024-06-28

## 2024-06-06 MED ORDER — NYSTATIN 100,000 UNIT/GRAM TOPICAL POWDER
TOPICAL | 3 refills | 0.00000 days | Status: CP
Start: 2024-06-06 — End: 2025-06-06

## 2024-07-02 ENCOUNTER — Emergency Department: Admit: 2024-07-02 | Discharge: 2024-07-02 | Discharge status: Against Medical Advice | Payer: Medicaid (Managed Care)

## 2024-07-02 DIAGNOSIS — Z3046 Encounter for surveillance of implantable subdermal contraceptive: Principal | ICD-10-CM

## 2024-07-02 DIAGNOSIS — Z23 Encounter for immunization: Principal | ICD-10-CM

## 2024-07-02 DIAGNOSIS — L680 Hirsutism: Principal | ICD-10-CM

## 2024-07-18 DIAGNOSIS — E119 Type 2 diabetes mellitus without complications: Principal | ICD-10-CM

## 2024-07-18 DIAGNOSIS — G8929 Other chronic pain: Principal | ICD-10-CM

## 2024-07-18 DIAGNOSIS — M5441 Lumbago with sciatica, right side: Principal | ICD-10-CM

## 2024-07-18 DIAGNOSIS — M5442 Lumbago with sciatica, left side: Principal | ICD-10-CM

## 2024-07-18 MED ORDER — GABAPENTIN 100 MG CAPSULE
ORAL_CAPSULE | Freq: Three times a day (TID) | ORAL | 3 refills | 90.00000 days
Start: 2024-07-18 — End: 2025-07-18

## 2024-07-18 MED ORDER — BLOOD GLUCOSE TEST STRIPS
ORAL_STRIP | Freq: Every day | 3 refills | 0.00000 days
Start: 2024-07-18 — End: 2025-07-18

## 2024-07-18 MED ORDER — LANCETS
3 refills | 0.00000 days
Start: 2024-07-18 — End: ?

## 2024-07-20 DIAGNOSIS — E119 Type 2 diabetes mellitus without complications: Principal | ICD-10-CM

## 2024-07-20 MED ORDER — BLOOD GLUCOSE TEST STRIPS
ORAL_STRIP | Freq: Every day | 3 refills | 0.00000 days
Start: 2024-07-20 — End: 2025-07-20

## 2024-07-20 MED ORDER — LANCETS
3 refills | 0.00000 days
Start: 2024-07-20 — End: ?

## 2024-07-20 MED ORDER — ACCU-CHEK GUIDE TEST STRIPS
ORAL_STRIP | 3 refills | 0.00000 days
Start: 2024-07-20 — End: ?

## 2024-07-20 MED ORDER — GABAPENTIN 100 MG CAPSULE
ORAL_CAPSULE | Freq: Three times a day (TID) | ORAL | 1 refills | 90.00000 days | Status: CP
Start: 2024-07-20 — End: 2025-07-20

## 2024-07-23 DIAGNOSIS — E119 Type 2 diabetes mellitus without complications: Principal | ICD-10-CM

## 2024-07-23 MED ORDER — ACCU-CHEK GUIDE TEST STRIPS
ORAL_STRIP | 3 refills | 0.00000 days
Start: 2024-07-23 — End: ?

## 2024-07-25 MED ORDER — ACCU-CHEK GUIDE TEST STRIPS
ORAL_STRIP | 3 refills | 0.00000 days
Start: 2024-07-25 — End: ?

## 2024-07-26 MED ORDER — BLOOD GLUCOSE TEST STRIPS
ORAL_STRIP | Freq: Every day | 3 refills | 0.00000 days | Status: CP
Start: 2024-07-26 — End: 2025-07-26
